# Patient Record
Sex: Male | Born: 1970 | Race: White | Hispanic: No | Marital: Single | Smoking: Former smoker
Health system: Southern US, Community
[De-identification: ages and names within clinical notes are randomized; demographics above are authoritative.]

## PROBLEM LIST (undated history)

## (undated) DIAGNOSIS — F1123 Opioid dependence with withdrawal: Secondary | ICD-10-CM

## (undated) DIAGNOSIS — F419 Anxiety disorder, unspecified: Secondary | ICD-10-CM

## (undated) DIAGNOSIS — F32A Depression, unspecified: Secondary | ICD-10-CM

## (undated) DIAGNOSIS — F1120 Opioid dependence, uncomplicated: Secondary | ICD-10-CM

## (undated) DIAGNOSIS — I10 Essential (primary) hypertension: Secondary | ICD-10-CM

## (undated) DIAGNOSIS — F1193 Opioid use, unspecified with withdrawal: Secondary | ICD-10-CM

## (undated) DIAGNOSIS — G43909 Migraine, unspecified, not intractable, without status migrainosus: Secondary | ICD-10-CM

## (undated) HISTORY — PX: CERVICAL SPINE SURGERY: SHX589

## (undated) HISTORY — PX: BACK SURGERY: SHX140

---

## 2020-04-18 ENCOUNTER — Emergency Department (HOSPITAL_COMMUNITY)
Admission: EM | Admit: 2020-04-18 | Discharge: 2020-04-18 | Disposition: A | Discharge status: Home / Self Care | Payer: Self-pay | Attending: Emergency Medicine | Admitting: Emergency Medicine

## 2020-04-18 ENCOUNTER — Emergency Department (HOSPITAL_COMMUNITY): Payer: Self-pay

## 2020-04-18 ENCOUNTER — Other Ambulatory Visit: Payer: Self-pay

## 2020-04-18 ENCOUNTER — Encounter (HOSPITAL_COMMUNITY): Payer: Self-pay | Admitting: *Deleted

## 2020-04-18 DIAGNOSIS — Z79899 Other long term (current) drug therapy: Secondary | ICD-10-CM | POA: Insufficient documentation

## 2020-04-18 DIAGNOSIS — R000 Tachycardia, unspecified: Secondary | ICD-10-CM | POA: Insufficient documentation

## 2020-04-18 DIAGNOSIS — I10 Essential (primary) hypertension: Secondary | ICD-10-CM | POA: Insufficient documentation

## 2020-04-18 DIAGNOSIS — R079 Chest pain, unspecified: Secondary | ICD-10-CM | POA: Insufficient documentation

## 2020-04-18 HISTORY — DX: Essential (primary) hypertension: I10

## 2020-04-18 HISTORY — DX: Anxiety disorder, unspecified: F41.9

## 2020-04-18 LAB — TROPONIN I (HIGH SENSITIVITY)
Troponin I (High Sensitivity): <2 ng/L (ref <18)
Troponin I (High Sensitivity): 2 ng/L (ref <18)

## 2020-04-18 LAB — BASIC METABOLIC PANEL
Anion gap: 9 (ref 5–15)
BUN: 15 mg/dL (ref 6–20)
CO2: 24 mmol/L (ref 22–32)
Calcium: 9.3 mg/dL (ref 8.9–10.3)
Chloride: 105 mmol/L (ref 98–111)
Creatinine, Ser: 0.97 mg/dL (ref 0.61–1.24)
GFR, Estimated: >60 mL/min (ref >60)
Glucose, Bld: 110 mg/dL — ABNORMAL HIGH (ref 70–99)
Potassium: 4.4 mmol/L (ref 3.5–5.1)
Sodium: 138 mmol/L (ref 135–145)

## 2020-04-18 LAB — CBC
HCT: 38.2 % — ABNORMAL LOW (ref 39.0–52.0)
Hemoglobin: 13.3 g/dL (ref 13.0–17.0)
MCH: 32.4 pg (ref 26.0–34.0)
MCHC: 34.8 g/dL (ref 30.0–36.0)
MCV: 92.9 fL (ref 80.0–100.0)
Platelets: 238 10*3/uL (ref 150–400)
RBC: 4.11 MIL/uL — ABNORMAL LOW (ref 4.22–5.81)
RDW: 11.7 % (ref 11.5–15.5)
WBC: 9 10*3/uL (ref 4.0–10.5)
nRBC: 0 % (ref 0.0–0.2)

## 2020-04-18 LAB — D-DIMER, QUANTITATIVE: D-Dimer, Quant: <0.27 ug/mL-FEU (ref 0.00–0.50)

## 2020-04-18 IMAGING — CR DG CHEST 2V
2 series · 2 of 2 positions shown · non-contrast
Comparison: None.

CLINICAL DATA: Chest pain

EXAM:
CHEST - 2 VIEW

[w chest pa]
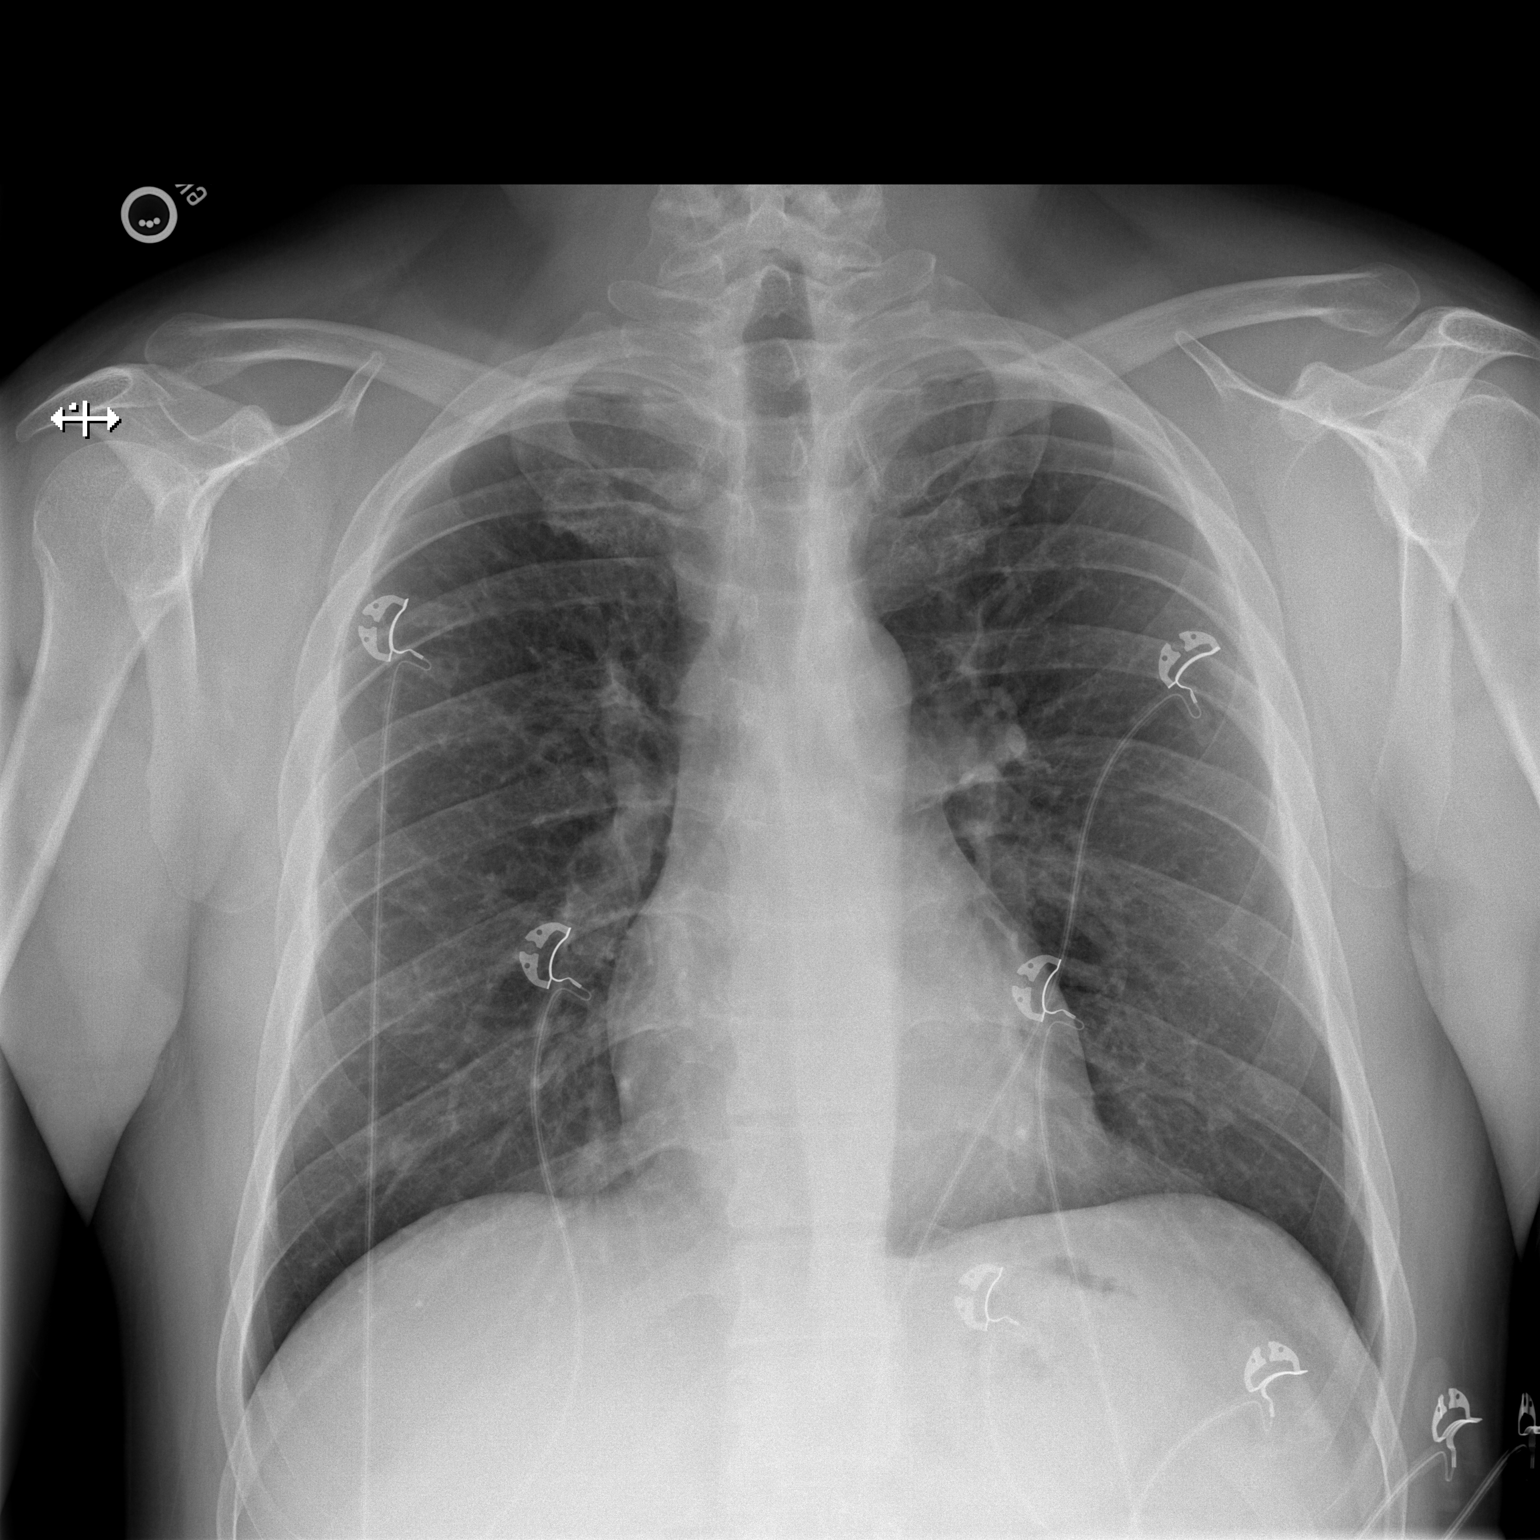

[w chest lat]
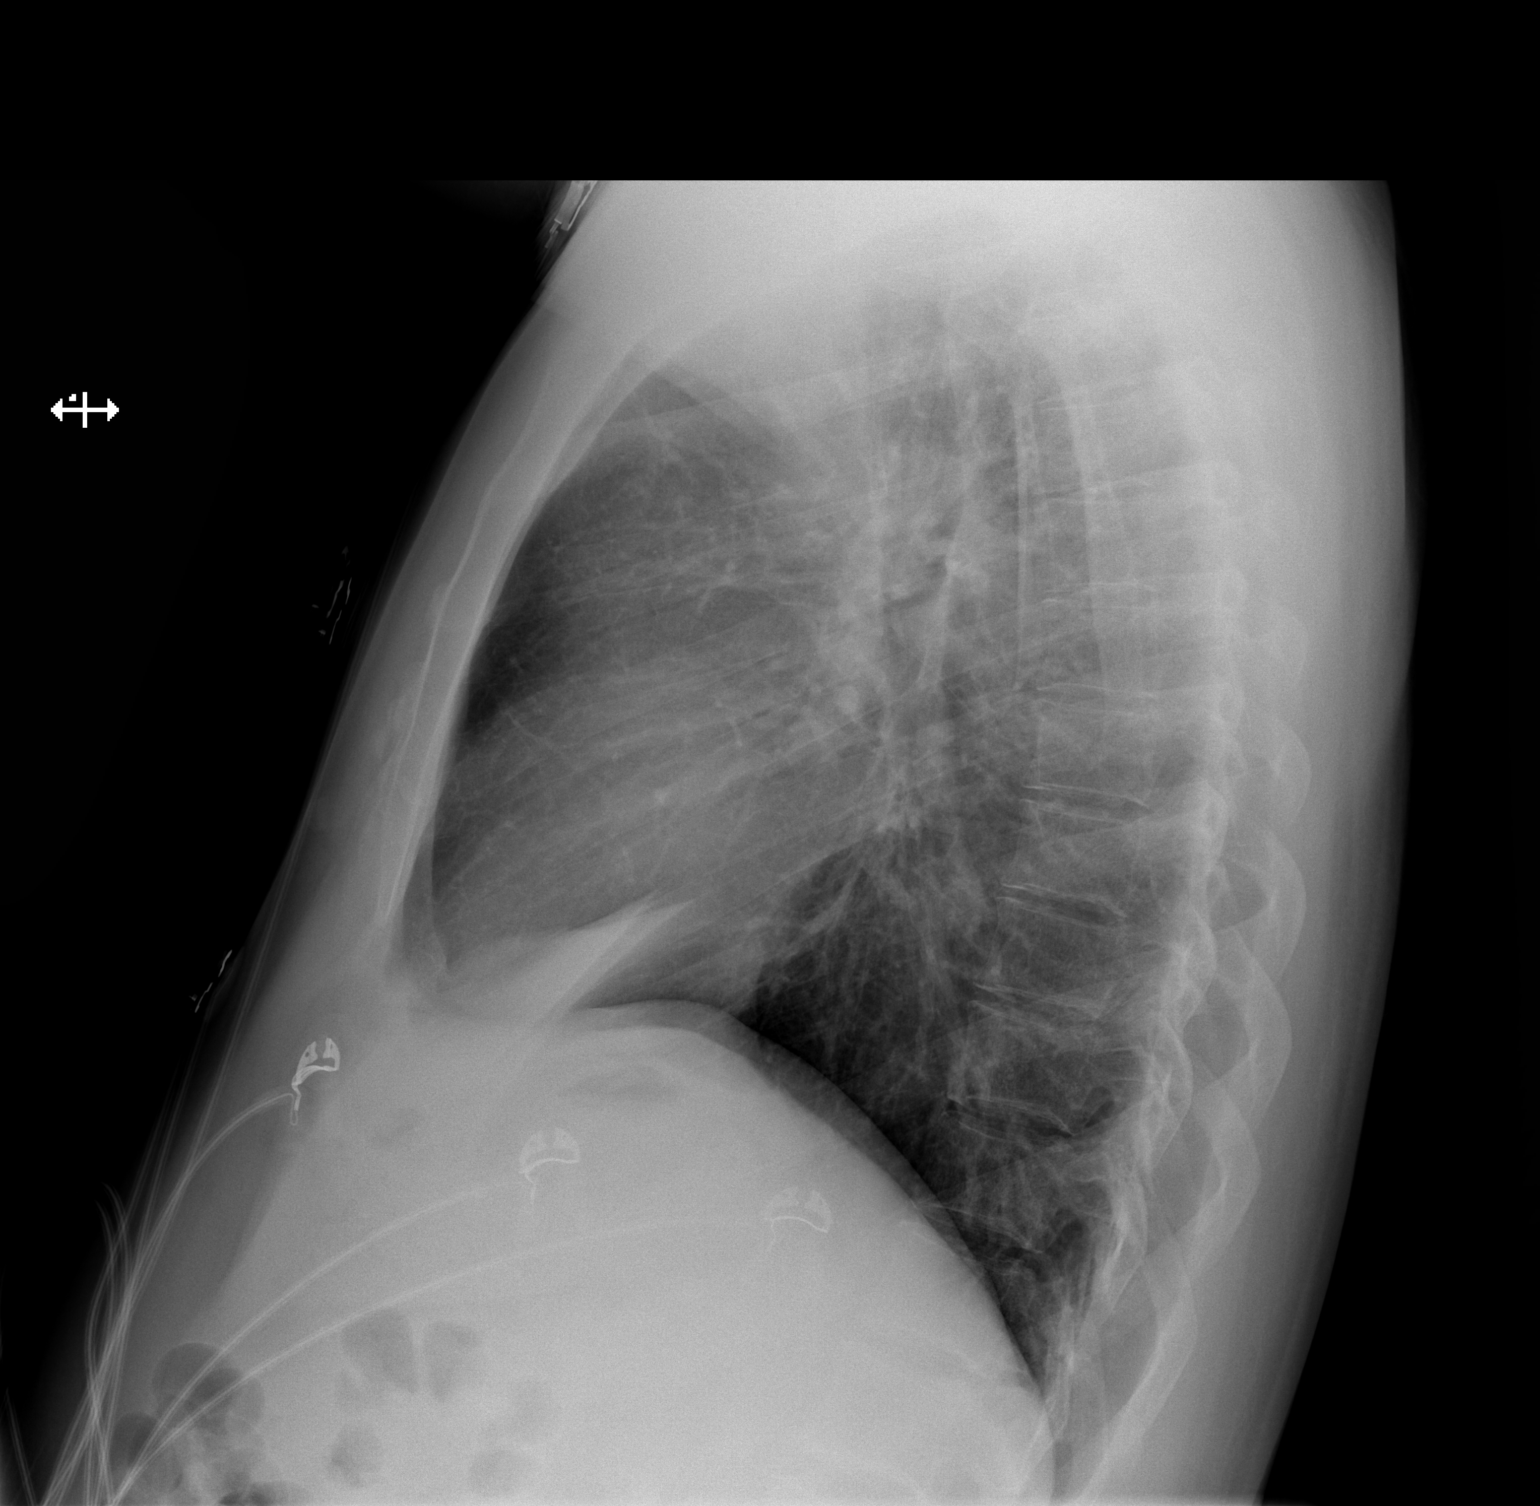

[2 of 2 positions shown; findings below may reference images not displayed]

FINDINGS: Heart and mediastinal contours are within normal limits. No focal
opacities or effusions. No acute bony abnormality.
IMPRESSION: No active cardiopulmonary disease.

## 2020-04-18 MED ORDER — FENTANYL CITRATE (PF) 100 MCG/2ML IJ SOLN
50.0000 ug | Freq: Once | INTRAMUSCULAR | Status: AC
  Administered 2020-04-18: 50 ug via INTRAVENOUS
  Filled 2020-04-18: qty 2

## 2020-04-18 MED ORDER — LORAZEPAM 2 MG/ML IJ SOLN
1.0000 mg | Freq: Once | INTRAMUSCULAR | Status: AC
  Administered 2020-04-18: 1 mg via INTRAVENOUS
  Filled 2020-04-18: qty 1

## 2020-04-18 MED ORDER — SODIUM CHLORIDE 0.9 % IV BOLUS
1000.0000 mL | Freq: Once | INTRAVENOUS | Status: AC
  Administered 2020-04-18: 1000 mL via INTRAVENOUS

## 2020-04-18 MED ORDER — ONDANSETRON HCL 4 MG/2ML IJ SOLN
4.0000 mg | Freq: Once | INTRAMUSCULAR | Status: AC
  Administered 2020-04-18: 4 mg via INTRAVENOUS
  Filled 2020-04-18: qty 2

## 2020-04-18 NOTE — ED Notes (Signed)
One unsuccessful attempt by this RN to obtain an IV.  Charge RN to attempt.

## 2020-04-18 NOTE — Discharge Instructions (Addendum)
As discussed, your work-up today was reassuring.  I have referred you to the [facility] wellness center as well as referred.  Support and given you additional resources to obtain a primary care doctor or or counseling that can help you with your anxiety.  Return to the Emergency Department immediately if you experiencing worsening chest pain, difficulty breathing, nausea/vomiting, get very sweaty, headache or any other worsening or concerning symptoms.

## 2020-04-18 NOTE — ED Triage Notes (Signed)
Pt states around 2 pm onset of chest pain central to left side. Pt had some shob and nausea. He states he has anxiety. Dad passed at 29 with MI so he decided to come have it checked out.

## 2020-04-18 NOTE — ED Notes (Signed)
Patient transported to X-ray 

## 2020-04-18 NOTE — ED Notes (Signed)
At 2009 this RN asked patient if he had a driver so pain medication can be given. Patient stated he had someone to pick him up. Fentanyl administered.  Patient being discharged now and states he going to drive home himself.

## 2020-04-18 NOTE — ED Provider Notes (Signed)
[facility]-EMERGENCY DEPT Provider Note   CSN: [id number] Arrival date & time: 04/18/20  1636     History Chief Complaint  Patient presents with  . Chest Pain    Samuel Ewing is a 49 y.o. male past medical history anxiety, hypertension who presents today for evaluation of chest pain that began about 2 PM this evening.  He reports that he was at home watching TV when chest pain started.  He described substernal chest pain that radiated to his back.  He states is been constant.  Initially was an 8 out of 10.  He said he got diaphoresis, nausea, palpitations, shortness of breath.  He states now, the pain is a 4/10.  He states that he has not taken any medications.  He feels like now he does not have any shortness of breath, nausea, dizziness, diaphoresis.  He states his dad had a history of MI at the age of 67 and his mom has history of heart attack in her 66s so he wanted to get it checked out and concerned.  He does have a history of anxiety and states he has been very anxious lately.  He recently moved to [place] from [place] to start a new job.  He states that he has had panic attacks before and states that sometimes that they have had similar symptoms.  He vapes currently.  He denies any cocaine use.  He occasionally smokes marijuana.  He is not currently on any testosterone.  He denies any recent admissions, surgeries, history of HIV, cancer, history of DVTs or PEs, leg swelling.  He does report that he recently traveled from [place] about a week ago to move down here.  He estimates was about a 6-hour drive.   The history is provided by the patient.    HPI: A 49 year old patient with a history of hypertension presents for evaluation of chest pain. Initial onset of pain was approximately 3-6 hours ago. The patient's chest pain is not worse with exertion. The patient reports some diaphoresis. The patient's chest pain is middle- or left-sided, is not  well-localized, is not described as heaviness/pressure/tightness, is not sharp and does not radiate to the arms/jaw/neck. The patient does not complain of nausea. The patient has a family history of coronary artery disease in a first-degree relative with onset less than age 16. The patient has no history of stroke, has no history of peripheral artery disease, has not smoked in the past 90 days, denies any history of treated diabetes, has no history of hypercholesterolemia and does not have an elevated BMI (>=30).   Past Medical History:  Diagnosis Date  . Anxiety   . Hypertension     There are no problems to display for this patient.   History reviewed. No pertinent surgical history.     No family history on file.  Social History   Tobacco Use  . Smoking status: Never Smoker  . Smokeless tobacco: Never Used  Vaping Use  . Vaping Use: Every day  Substance Use Topics  . Alcohol use: Never  . Drug use: Yes    Types: Marijuana    Home Medications Prior to Admission medications   Medication Sig Start Date End Date Taking? Authorizing Provider  amLODipine (NORVASC) 10 MG tablet Take 10 mg by mouth daily.  01/03/20  Yes [provider]  atorvastatin (LIPITOR) 20 MG tablet Take 20 mg by mouth daily. 04/08/20  Yes [provider]  busPIRone (BUSPAR) 30 MG  tablet Take 30 mg by mouth 2 (two) times daily. 04/08/20  Yes [provider]  DULoxetine (CYMBALTA) 20 MG capsule Take 40 mg by mouth daily.   Yes [provider]  hydrOXYzine (ATARAX/VISTARIL) 50 MG tablet Take 50 mg by mouth daily as needed for anxiety.  04/08/20  Yes [provider]  ibuprofen (ADVIL) 800 MG tablet Take 800 mg by mouth every 8 (eight) hours as needed for moderate pain.  03/24/20  Yes [provider]  levothyroxine (SYNTHROID) 50 MCG tablet Take 50 mcg by mouth daily. 04/08/20  Yes [provider]  lisinopril (ZESTRIL) 20 MG tablet Take 20 mg by mouth  daily. 04/08/20  Yes [provider]  Melatonin 10 MG TABS Take 10 mg by mouth daily as needed (sleep).   Yes [provider]  SUMAtriptan (IMITREX) 100 MG tablet Take 100 mg by mouth daily as needed for migraine. May repeat in 2 hours if headache persists or recurs.   Yes [provider]  ziprasidone (GEODON) 40 MG capsule Take 40 mg by mouth 2 (two) times daily with a meal.   Yes [provider]    Allergies    Morphine and related  Review of Systems   Review of Systems  Constitutional: Positive for diaphoresis. Negative for fever.  Respiratory: Positive for shortness of breath. Negative for cough.   Cardiovascular: Positive for chest pain.  Gastrointestinal: Positive for nausea. Negative for abdominal pain and vomiting.  Genitourinary: Negative for dysuria and hematuria.  Neurological: Negative for headaches.  All other systems reviewed and are negative.   Physical Exam Updated Vital Signs BP (!) 137/97 (BP Location: Left Arm)   Pulse 90   Temp 98.5 F (36.9 C) (Oral)   Resp 20   Ht 5\' 10"  (1.778 m)   Wt 86.2 kg   SpO2 98%   BMI 27.26 kg/m   Physical Exam Vitals and nursing note reviewed.  Constitutional:      Appearance: Normal appearance. He is well-developed.  HENT:     Head: Normocephalic and atraumatic.  Eyes:     General: Lids are normal.     Conjunctiva/sclera: Conjunctivae normal.     Pupils: Pupils are equal, round, and reactive to light.  Cardiovascular:     Rate and Rhythm: Regular rhythm. Tachycardia present.     Pulses: Normal pulses.          Radial pulses are 2+ on the right side and 2+ on the left side.       Dorsalis pedis pulses are 2+ on the right side and 2+ on the left side.     Heart sounds: Normal heart sounds. No murmur heard.  No friction rub. No gallop.   Pulmonary:     Effort: Pulmonary effort is normal.     Breath sounds: Normal breath sounds.     Comments: Lungs clear to auscultation bilaterally.   Symmetric chest rise.  No wheezing, rales, rhonchi. Abdominal:     Palpations: Abdomen is soft. Abdomen is not rigid.     Tenderness: There is no abdominal tenderness. There is no guarding.     Comments: Abdomen is soft, non-distended, non-tender. No rigidity, No guarding. No peritoneal signs.  Musculoskeletal:        General: Normal range of motion.     Cervical back: Full passive range of motion without pain.     Comments: BLE are symmetric in appearance without any overlying warmth, erythema or edema.   Skin:  General: Skin is warm and dry.     Capillary Refill: Capillary refill takes less than 2 seconds.  Neurological:     Mental Status: He is alert and oriented to person, place, and time.  Psychiatric:        Speech: Speech normal.     ED Results / Procedures / Treatments   Labs (all labs ordered are listed, but only abnormal results are displayed) Labs Reviewed  BASIC METABOLIC PANEL - Abnormal; Notable for the following components:      Result Value   Glucose, Bld 110 (*)    All other components within normal limits  CBC - Abnormal; Notable for the following components:   RBC 4.11 (*)    HCT 38.2 (*)    All other components within normal limits  D-DIMER, QUANTITATIVE (NOT AT [facility])  TROPONIN I (HIGH SENSITIVITY)  TROPONIN I (HIGH SENSITIVITY)    EKG EKG Interpretation  Date/Time:  Friday April 18 2020 16:42:42 EDT Ventricular Rate:  107 PR Interval:    QRS Duration: 122 QT Interval:  342 QTC Calculation: 457 R Axis:   46 Text Interpretation: Sinus tachycardia Nonspecific intraventricular conduction delay 12 Lead; Mason-Likar Confirmed by Bethann Berkshire [phone]) on 04/18/2020 9:07:38 PM   Radiology DG Chest 2 View  Result Date: 04/18/2020 CLINICAL DATA:  Chest pain EXAM: CHEST - 2 VIEW COMPARISON:  None. FINDINGS: Heart and mediastinal contours are within normal limits. No focal opacities or effusions. No acute bony abnormality. IMPRESSION: No active  cardiopulmonary disease. Electronically Signed   By: Charlett Nose M.D.   On: 04/18/2020 17:52    Procedures Procedures (including critical care time)  Medications Ordered in ED Medications  LORazepam (ATIVAN) injection 1 mg (1 mg Intravenous Given 04/18/20 1857)  sodium chloride 0.9 % bolus 1,000 mL (0 mLs Intravenous Stopped 04/18/20 2126)  fentaNYL (SUBLIMAZE) injection 50 mcg (50 mcg Intravenous Given 04/18/20 2009)  ondansetron (ZOFRAN) injection 4 mg (4 mg Intravenous Given 04/18/20 2009)    ED Course  I have reviewed the triage vital signs and the nursing notes.  Pertinent labs & imaging results that were available during my care of the patient were reviewed by me and considered in my medical decision making (see chart for details).  Clinical Course as of Apr 19 2307  Fri Apr 18, 2020  1828 DG Chest 2 View [KK]    Clinical Course User Index [KK] Janene Harvey Sherilyn Dacosta   MDM Rules/Calculators/A&P HEAR Score: 3                        49 year old male past history of hypertension, anxiety presents for evaluation of chest pain that began approximately 2 PM this evening.  Reports associated diaphoresis, shortness of breath, nausea/vomiting.  He states pain is improved.  He does report he has a history of anxiety and has had panic attacks.  On initial arrival, he is tachycardic, hypertensive.  He is very anxious.  He otherwise looks very well-appearing.  Suspect this is most likely anxiety.  Doubt this is ACS etiology but he does have significant cardiac history.  History/physical exam is not concerning for dissection.  Doubt PE that he is tachycardic and has recently had a travel from [place].  Will obtain blood work.  I reviewed his records.  He had previously been living in [place].  In July 2021, he was admitted for chest pain observation.  At that time, he had an echo and a stress test  that were unremarkable.  I reviewed his records and he has had multiple visits  for chest pain where he describes a similar sensation.  This seems to be typical of his chest pains.  CBC shows no leukocytosis.  Hemoglobin stable at 13.3.  Initial troponin negative.  BMP is unremarkable.  D-dimer is negative.  Chest x-ray is unremarkable.  Given patient's history/physical exam, he has a heart score of 2.   Will plan for delta troponin.  Delta trop is negative.   Discussed results with patient.  Patient's vitals are stable.  I discussed with patient regarding his medications.  He was given a 30-day course of his medications, including Zyprexa, BuSpar, Vistaril.  He states he used to be on Ativan but has not taken it in a while.  He states that he may need something more for his anxiety.  I discussed with him at length regarding starting him on Ativan here in the ED.  I do not see any record of him being prescribed Ativan on his visit on 04/08/2020 when he was given his other medications.  At this time, he has no follow-up that can monitor his Ativan use.  I discussed with him that it would be more reasonable to try an attempt to establish primary care as he will need refills of his medication with a 30-day supply runs out and that at that time, he can discuss with them regarding Ativan.  Patient is agreeable. At this time, patient exhibits no emergent life-threatening condition that require further evaluation in ED. Discussed patient with Dr. Estell Harpin who agrees with plan. Patient had ample opportunity for questions and discussion. All patient's questions were answered with full understanding. Strict return precautions discussed. Patient expresses understanding and agreement to plan.   Portions of this note were generated with Scientist, clinical (histocompatibility and immunogenetics). Dictation errors may occur despite best attempts at proofreading.  Final Clinical Impression(s) / ED Diagnoses Final diagnoses:  Nonspecific chest pain    Rx / DC Orders ED Discharge Orders    None       Rosana Hoes 04/18/20 2308    Bethann Berkshire, MD 04/29/20 1539

## 2020-04-30 ENCOUNTER — Other Ambulatory Visit: Payer: Self-pay

## 2020-04-30 ENCOUNTER — Emergency Department (HOSPITAL_COMMUNITY): Payer: Self-pay

## 2020-04-30 ENCOUNTER — Emergency Department (HOSPITAL_COMMUNITY)
Admission: EM | Admit: 2020-04-30 | Discharge: 2020-04-30 | Disposition: A | Discharge status: Home / Self Care | Payer: Self-pay | Attending: Emergency Medicine | Admitting: Emergency Medicine

## 2020-04-30 ENCOUNTER — Encounter (HOSPITAL_COMMUNITY): Payer: Self-pay

## 2020-04-30 DIAGNOSIS — I10 Essential (primary) hypertension: Secondary | ICD-10-CM | POA: Insufficient documentation

## 2020-04-30 DIAGNOSIS — Z20822 Contact with and (suspected) exposure to covid-19: Secondary | ICD-10-CM | POA: Insufficient documentation

## 2020-04-30 DIAGNOSIS — Z79899 Other long term (current) drug therapy: Secondary | ICD-10-CM | POA: Insufficient documentation

## 2020-04-30 DIAGNOSIS — G43809 Other migraine, not intractable, without status migrainosus: Secondary | ICD-10-CM | POA: Insufficient documentation

## 2020-04-30 DIAGNOSIS — F410 Panic disorder [episodic paroxysmal anxiety] without agoraphobia: Secondary | ICD-10-CM | POA: Insufficient documentation

## 2020-04-30 HISTORY — DX: Migraine, unspecified, not intractable, without status migrainosus: G43.909

## 2020-04-30 LAB — TROPONIN I (HIGH SENSITIVITY): Troponin I (High Sensitivity): <2 ng/L (ref <18)

## 2020-04-30 LAB — CBC
HCT: 38.7 % — ABNORMAL LOW (ref 39.0–52.0)
Hemoglobin: 13.5 g/dL (ref 13.0–17.0)
MCH: 32.4 pg (ref 26.0–34.0)
MCHC: 34.9 g/dL (ref 30.0–36.0)
MCV: 92.8 fL (ref 80.0–100.0)
Platelets: 208 10*3/uL (ref 150–400)
RBC: 4.17 MIL/uL — ABNORMAL LOW (ref 4.22–5.81)
RDW: 11.6 % (ref 11.5–15.5)
WBC: 6.2 10*3/uL (ref 4.0–10.5)
nRBC: 0 % (ref 0.0–0.2)

## 2020-04-30 LAB — RESPIRATORY PANEL BY RT PCR (FLU A&B, COVID)
Influenza A by PCR: NEGATIVE
Influenza B by PCR: NEGATIVE
SARS Coronavirus 2 by RT PCR: NEGATIVE

## 2020-04-30 LAB — BASIC METABOLIC PANEL
Anion gap: 11 (ref 5–15)
BUN: 12 mg/dL (ref 6–20)
CO2: 25 mmol/L (ref 22–32)
Calcium: 9.3 mg/dL (ref 8.9–10.3)
Chloride: 103 mmol/L (ref 98–111)
Creatinine, Ser: 0.97 mg/dL (ref 0.61–1.24)
GFR, Estimated: >60 mL/min (ref >60)
Glucose, Bld: 98 mg/dL (ref 70–99)
Potassium: 4.2 mmol/L (ref 3.5–5.1)
Sodium: 139 mmol/L (ref 135–145)

## 2020-04-30 IMAGING — CR DG CHEST 2V
2 series · 2 of 2 positions shown · non-contrast
Comparison: [DATE]

CLINICAL DATA: Mid chest tightness for 2 hours

EXAM:
CHEST - 2 VIEW

[w chest pa]
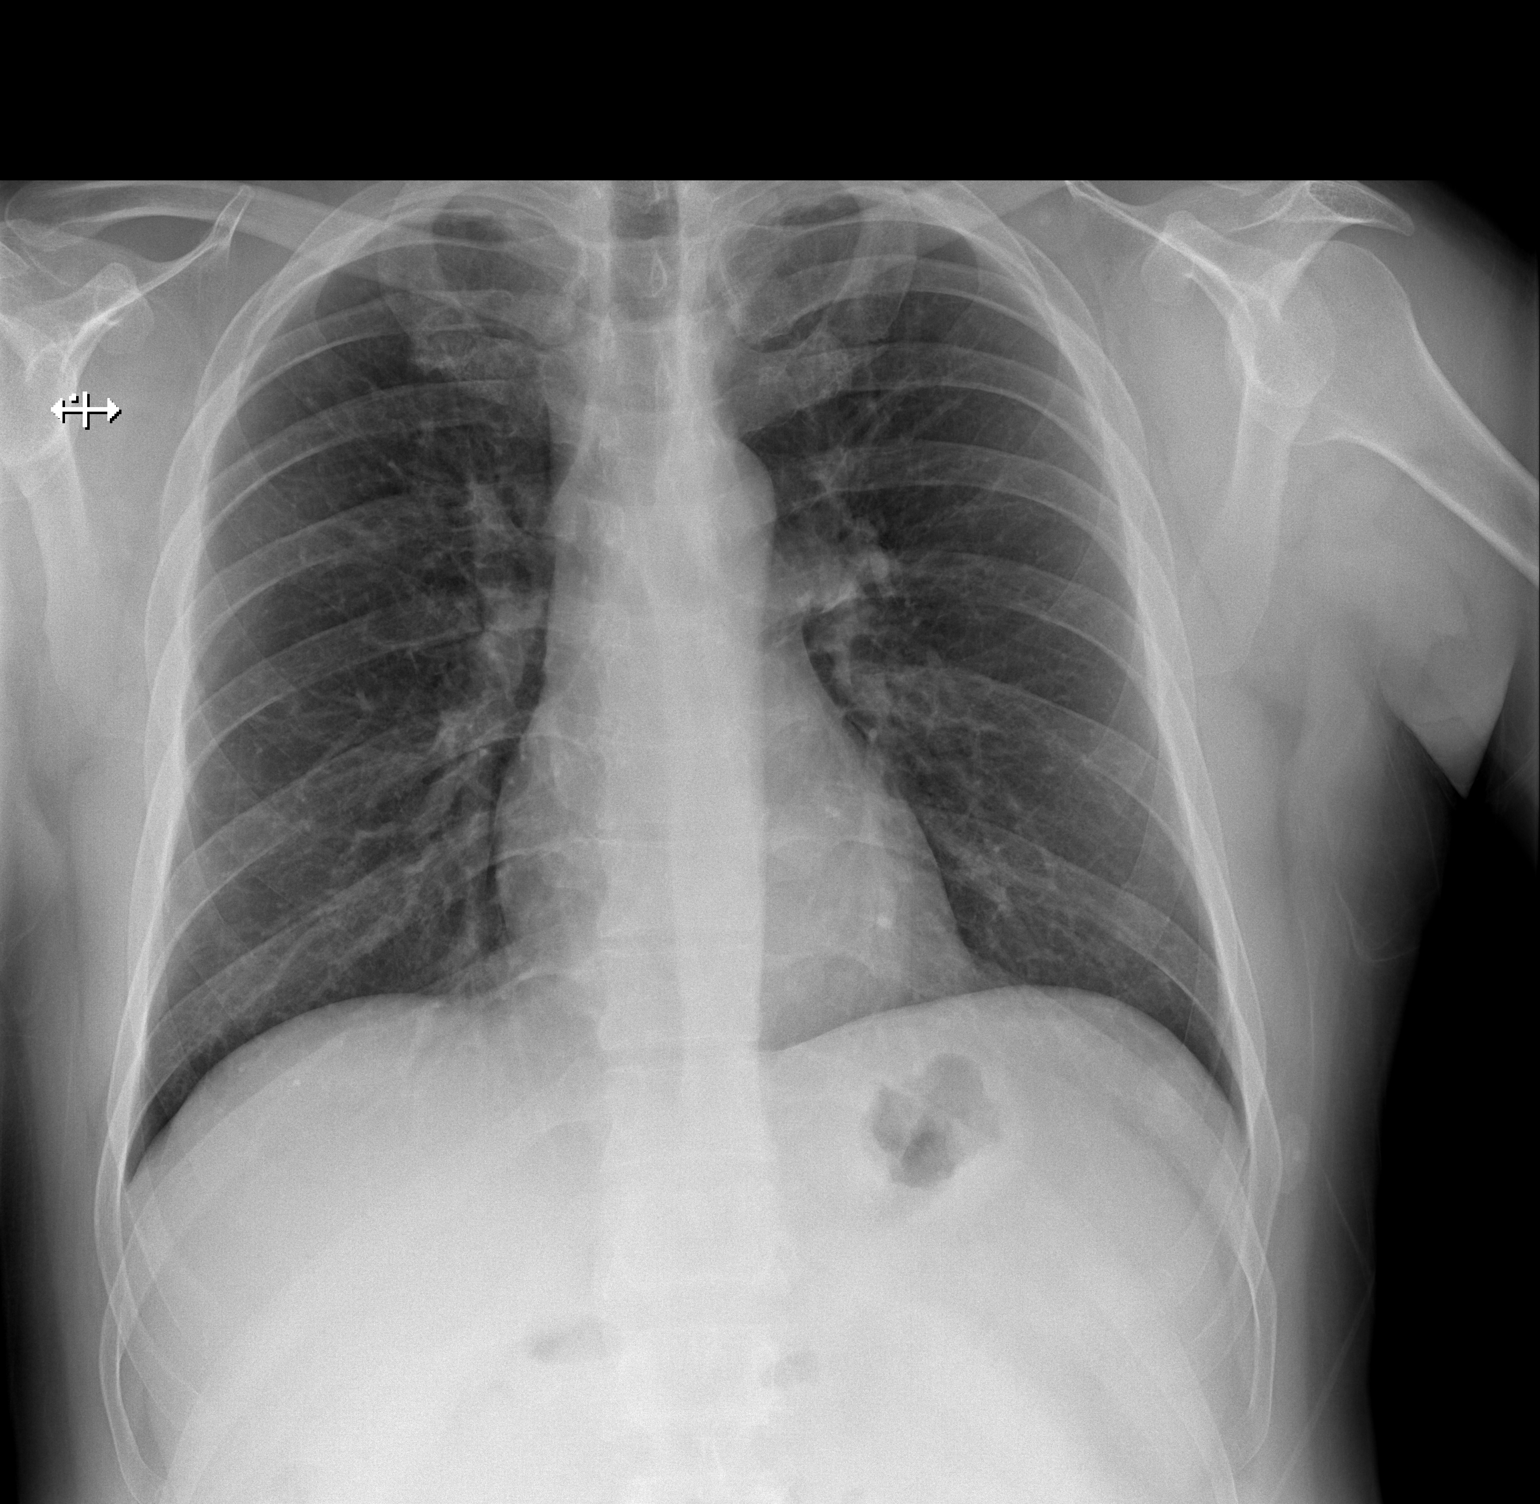

[w chest lat]
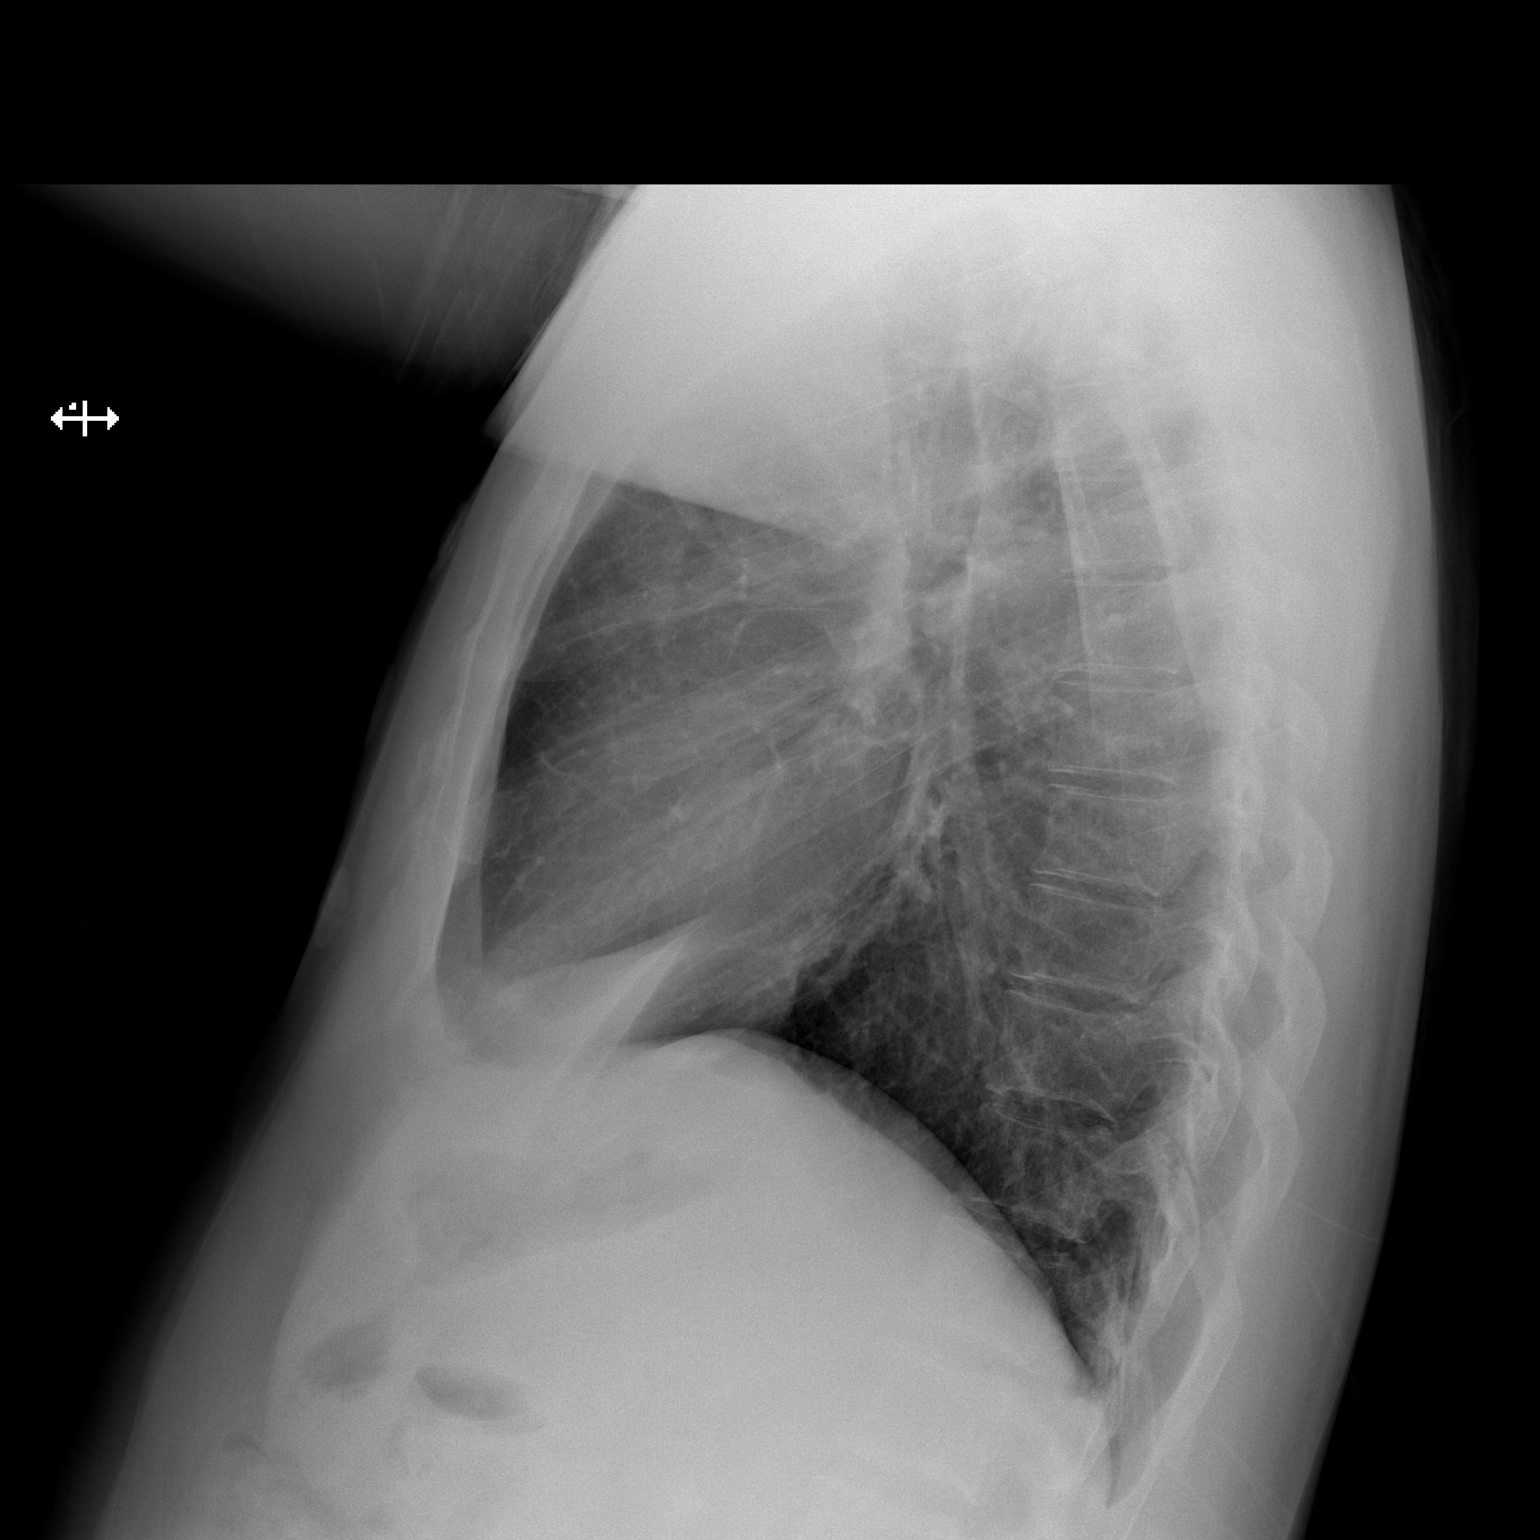

[2 of 2 positions shown; findings below may reference images not displayed]

FINDINGS: The heart size and mediastinal contours are within normal limits.
Both lungs are clear. The visualized skeletal structures are
unremarkable.
IMPRESSION: No active cardiopulmonary disease.

## 2020-04-30 MED ORDER — ONDANSETRON 4 MG PO TBDP
4.0000 mg | ORAL_TABLET | Freq: Once | ORAL | Status: AC
  Administered 2020-04-30: 4 mg via ORAL
  Filled 2020-04-30: qty 1

## 2020-04-30 MED ORDER — FENTANYL CITRATE (PF) 100 MCG/2ML IJ SOLN
50.0000 ug | Freq: Once | INTRAMUSCULAR | Status: AC
  Administered 2020-04-30: 50 ug via INTRAVENOUS
  Filled 2020-04-30: qty 2

## 2020-04-30 MED ORDER — METOCLOPRAMIDE HCL 5 MG/ML IJ SOLN
10.0000 mg | Freq: Once | INTRAMUSCULAR | Status: AC
  Administered 2020-04-30: 10 mg via INTRAVENOUS
  Filled 2020-04-30: qty 2

## 2020-04-30 MED ORDER — LORAZEPAM 1 MG PO TABS
1.0000 mg | ORAL_TABLET | Freq: Once | ORAL | Status: AC
  Administered 2020-04-30: 1 mg via ORAL
  Filled 2020-04-30: qty 1

## 2020-04-30 MED ORDER — DIPHENHYDRAMINE HCL 50 MG/ML IJ SOLN
25.0000 mg | Freq: Once | INTRAMUSCULAR | Status: AC
  Administered 2020-04-30: 25 mg via INTRAVENOUS
  Filled 2020-04-30: qty 1

## 2020-04-30 MED ORDER — PROCHLORPERAZINE EDISYLATE 10 MG/2ML IJ SOLN
10.0000 mg | Freq: Once | INTRAMUSCULAR | Status: AC
  Administered 2020-04-30: 10 mg via INTRAVENOUS
  Filled 2020-04-30: qty 2

## 2020-04-30 NOTE — ED Triage Notes (Signed)
Patient c/o mid chest tightness x 2 hours. Patient states he took hydroxyzine with no relief.  patient also c/o headache/migraine which he says he has a history of. Migraine began this AM. Patient c/o nausea and diarrhea.

## 2020-04-30 NOTE — ED Provider Notes (Signed)
[facility]-EMERGENCY DEPT Provider Note   CSN: [id number] Arrival date & time: 04/30/20  1602     History Chief Complaint  Patient presents with  . Chest Pain  . Headache  . Shortness of Breath  . Anxiety    Samuel Ewing is a 49 y.o. male.  HPI 49 year old male with history of hypertension, anxiety and migraines presents to the ER with complaints of migraine, chest tightness in the setting of a panic attack.  Patient states that he just moved here 2 weeks ago and has been going through some stress adjusting to new job.  He has some home sumatriptan that he took with little relief.  States he has aura currently.  Denies any nausea or vomiting.  States this is very typical for his migraines.  He states he has had prior history of hospitalizations for intractable migraine.  He denies any shortness of breath, diaphoresis, pain radiating to the back.  Denies any fevers or chills.  States that his chest tightness is typical for his anxiety.    Past Medical History:  Diagnosis Date  . Anxiety   . Hypertension   . Migraine     There are no problems to display for this patient.   History reviewed. No pertinent surgical history.     Family History  Problem Relation Age of Onset  . Heart failure Mother   . Heart failure Father   . Heart attack Father     Social History   Tobacco Use  . Smoking status: Never Smoker  . Smokeless tobacco: Never Used  Vaping Use  . Vaping Use: Every day  . Substances: Nicotine  Substance Use Topics  . Alcohol use: Never  . Drug use: Yes    Types: Marijuana    Home Medications Prior to Admission medications   Medication Sig Start Date End Date Taking? Authorizing Provider  amLODipine (NORVASC) 10 MG tablet Take 10 mg by mouth daily.  01/03/20  Yes [provider]  atorvastatin (LIPITOR) 20 MG tablet Take 20 mg by mouth daily. 04/08/20  Yes [provider]  busPIRone (BUSPAR) 30 MG tablet Take  30 mg by mouth 2 (two) times daily. 04/08/20  Yes [provider]  DULoxetine (CYMBALTA) 20 MG capsule Take 40 mg by mouth daily.   Yes [provider]  hydrOXYzine (ATARAX/VISTARIL) 50 MG tablet Take 50 mg by mouth daily as needed for anxiety.  04/08/20  Yes [provider]  ibuprofen (ADVIL) 800 MG tablet Take 800 mg by mouth every 8 (eight) hours as needed for moderate pain.  03/24/20  Yes [provider]  levothyroxine (SYNTHROID) 50 MCG tablet Take 50 mcg by mouth daily. 04/08/20  Yes [provider]  lisinopril (ZESTRIL) 20 MG tablet Take 20 mg by mouth daily. 04/08/20  Yes [provider]  LORazepam (ATIVAN) 0.5 MG tablet Take 0.5 mg by mouth daily as needed for anxiety.  04/14/20  Yes [provider]  Melatonin 10 MG TABS Take 10 mg by mouth daily as needed (sleep).   Yes [provider]  SUMAtriptan (IMITREX) 100 MG tablet Take 100 mg by mouth daily as needed for migraine. May repeat in 2 hours if headache persists or recurs.   Yes [provider]  ziprasidone (GEODON) 40 MG capsule Take 40 mg by mouth 2 (two) times daily with a meal.   Yes [provider]    Allergies    Morphine and related  Review of  Systems   Review of Systems  Constitutional: Negative for chills and fever.  HENT: Negative for ear pain and sore throat.   Eyes: Positive for visual disturbance. Negative for pain.  Respiratory: Negative for cough and shortness of breath.   Cardiovascular: Positive for chest pain. Negative for palpitations.  Gastrointestinal: Negative for abdominal pain and vomiting.  Genitourinary: Negative for dysuria and hematuria.  Musculoskeletal: Negative for arthralgias and back pain.  Skin: Negative for color change and rash.  Neurological: Positive for headaches. Negative for seizures and syncope.  All other systems reviewed and are negative.   Physical Exam Updated Vital Signs BP (!) 147/90 (BP  Location: Left Arm)   Pulse 98   Temp 98.5 F (36.9 C) (Oral)   Resp 18   Ht 5\' 10"  (1.778 m)   Wt 86.2 kg   SpO2 98%   BMI 27.26 kg/m   Physical Exam Vitals and nursing note reviewed.  Constitutional:      General: He is not in acute distress.    Appearance: He is well-developed. He is not ill-appearing, toxic-appearing or diaphoretic.     Comments: Resting comfortably in the ER bed with the lights off  HENT:     Head: Normocephalic and atraumatic.  Eyes:     Conjunctiva/sclera: Conjunctivae normal.  Cardiovascular:     Rate and Rhythm: Normal rate and regular rhythm.     Pulses:          Radial pulses are 2+ on the right side and 2+ on the left side.     Heart sounds: Normal heart sounds. No murmur heard.   Pulmonary:     Effort: Pulmonary effort is normal. No respiratory distress.     Breath sounds: Normal breath sounds. No decreased breath sounds or wheezing.  Abdominal:     Palpations: Abdomen is soft.     Tenderness: There is no abdominal tenderness.  Musculoskeletal:     Cervical back: Neck supple.     Right lower leg: No tenderness. No edema.     Left lower leg: No tenderness. No edema.  Skin:    General: Skin is warm and dry.  Neurological:     General: No focal deficit present.     Mental Status: He is alert.     Cranial Nerves: No cranial nerve deficit.     Motor: No weakness.     Comments: Mental Status:  Alert, thought content appropriate, able to give a coherent history. Speech fluent without evidence of aphasia. Able to follow 2 step commands without difficulty.  Cranial Nerves:  II: Peripheral visual fields grossly normal, pupils equal, round, reactive to light III,IV, VI: ptosis not present, extra-ocular motions intact bilaterally  V,VII: smile symmetric, facial light touch sensation equal VIII: hearing grossly normal to voice  X: uvula elevates symmetrically  XI: bilateral shoulder shrug symmetric and strong XII: midline tongue extension without  fassiculations Motor:  Normal tone. 5/5 strength of BUE and BLE major muscle groups including strong and equal grip strength and dorsiflexion/plantar flexion Sensory: light touch normal in all extremities. Cerebellar: normal finger-to-nose with bilateral upper extremities, Romberg sign absent Gait: normal gait and balance. Able to walk on toes and heels with ease.       ED Results / Procedures / Treatments   Labs (all labs ordered are listed, but only abnormal results are displayed) Labs Reviewed  CBC - Abnormal; Notable for the following components:      Result Value   RBC 4.17 (*)  HCT 38.7 (*)    All other components within normal limits  RESPIRATORY PANEL BY RT PCR (FLU A&B, COVID)  BASIC METABOLIC PANEL  TROPONIN I (HIGH SENSITIVITY)  TROPONIN I (HIGH SENSITIVITY)    EKG None  Radiology DG Chest 2 View  Result Date: 04/30/2020 CLINICAL DATA:  Mid chest tightness for 2 hours EXAM: CHEST - 2 VIEW COMPARISON:  04/18/2020 FINDINGS: The heart size and mediastinal contours are within normal limits. Both lungs are clear. The visualized skeletal structures are unremarkable. IMPRESSION: No active cardiopulmonary disease. Electronically Signed   By: Sharlet Salina M.D.   On: 04/30/2020 16:28    Procedures Procedures (including critical care time)  Medications Ordered in ED Medications  prochlorperazine (COMPAZINE) injection 10 mg (10 mg Intravenous Given 04/30/20 1842)  diphenhydrAMINE (BENADRYL) injection 25 mg (25 mg Intravenous Given 04/30/20 1842)  ondansetron (ZOFRAN-ODT) disintegrating tablet 4 mg (4 mg Oral Given 04/30/20 1730)  LORazepam (ATIVAN) tablet 1 mg (1 mg Oral Given 04/30/20 1843)  metoCLOPramide (REGLAN) injection 10 mg (10 mg Intravenous Given 04/30/20 2000)  fentaNYL (SUBLIMAZE) injection 50 mcg (50 mcg Intravenous Given 04/30/20 2003)    ED Course  I have reviewed the triage vital signs and the nursing notes.  Pertinent labs & imaging results that were  available during my care of the patient were reviewed by me and considered in my medical decision making (see chart for details).    MDM Rules/Calculators/A&P                          Patient with complaints of migraine and chest tightness in the setting of a panic attack Presentation, he is alert, oriented, nontoxic-appearing, no acute distress, resting comfortably in the ER bed with the lights off.  He is slightly hypertensive but overall vitals are reassuring.  Physical exam with no gross neurologic deficits, lung sounds clear, he appears to be in no respiratory distress.  His work appears overall reassuring, BMP without any abnormalities, troponin less than 2, Covid is negative, CBC without abnormalities.  Patient states that this chest tightness is consistent with his panic attacks.  Chest x-ray without evidence of abnormality.  EKG reviewed by me, normal sinus rhythm.  Low suspicion for ACS, intracranial bleed, dissection, PE.  Patient was treated with migraine cocktail, Zofran, stated he was still having a migraine.  As per discussion with Dr. Freida Busman, I did give the patient 1 mg of Ativan, Reglan and fentanyl.  On reevaluation, patient is significant improvement of symptoms and would like to go home.  Contact information for neurology, migraine clinic, psychiatry, PCP was given.  Return precautions discussed.  He voiced understanding and is agreeable.  At this stage in the ED course, the the patient is medically screened and stable for discharge. Final Clinical Impression(s) / ED Diagnoses Final diagnoses:  Panic attack  Other migraine without status migrainosus, not intractable    Rx / DC Orders ED Discharge Orders    None       Leone Brand 04/30/20 2032    Lorre Nick, MD 04/30/20 [phone]

## 2020-04-30 NOTE — ED Notes (Signed)
CBC recollected and sent to lab 

## 2020-04-30 NOTE — Discharge Instructions (Addendum)
Your work-up today was overall reassuring.  I have provided the contact information for [facility] neurology, or you may call the headache wellness center as well to try to make an appointment.  You may call the phone number in your discharge paperwork in order to establish with a primary care doctor.  Also, I have provided the resource guide with several psychiatric contact information.  If you are experiencing an acute panic attack, you may also go to the [facility] behavioral health urgent care

## 2020-05-06 ENCOUNTER — Other Ambulatory Visit: Payer: Self-pay

## 2020-05-06 ENCOUNTER — Emergency Department (HOSPITAL_COMMUNITY)
Admission: EM | Admit: 2020-05-06 | Discharge: 2020-05-06 | Disposition: A | Discharge status: Home / Self Care | Payer: Self-pay | Attending: Emergency Medicine | Admitting: Emergency Medicine

## 2020-05-06 ENCOUNTER — Ambulatory Visit (HOSPITAL_COMMUNITY)
Admission: EM | Admit: 2020-05-06 | Discharge: 2020-05-06 | Disposition: A | Discharge status: Home / Self Care | Payer: Self-pay

## 2020-05-06 ENCOUNTER — Encounter (HOSPITAL_COMMUNITY): Payer: Self-pay | Admitting: *Deleted

## 2020-05-06 DIAGNOSIS — R519 Headache, unspecified: Secondary | ICD-10-CM | POA: Insufficient documentation

## 2020-05-06 DIAGNOSIS — Z79899 Other long term (current) drug therapy: Secondary | ICD-10-CM | POA: Insufficient documentation

## 2020-05-06 DIAGNOSIS — R112 Nausea with vomiting, unspecified: Secondary | ICD-10-CM | POA: Insufficient documentation

## 2020-05-06 DIAGNOSIS — I10 Essential (primary) hypertension: Secondary | ICD-10-CM | POA: Insufficient documentation

## 2020-05-06 DIAGNOSIS — G43809 Other migraine, not intractable, without status migrainosus: Secondary | ICD-10-CM

## 2020-05-06 DIAGNOSIS — F419 Anxiety disorder, unspecified: Secondary | ICD-10-CM | POA: Insufficient documentation

## 2020-05-06 MED ORDER — FENTANYL CITRATE (PF) 100 MCG/2ML IJ SOLN
50.0000 ug | Freq: Once | INTRAMUSCULAR | Status: AC
  Administered 2020-05-06: 50 ug via NASAL
  Filled 2020-05-06: qty 2

## 2020-05-06 NOTE — ED Provider Notes (Signed)
[facility]-EMERGENCY DEPT Provider Note   CSN: [id number] Arrival date & time: 05/06/20  1050     History Chief Complaint  Patient presents with  . Migraine  . Anxiety    Samuel Ewing is a 49 y.o. male.  HPI     49 year old male history of anxiety and migraines presents today complaining of migraine headache.  He states he is also having symptoms consistent with his anxiety.  Migraine headache worsened this morning approximately 7 AM.  He is taken 2 doses of his Imitrex without relief.  He describes this as his usual migraine headache but severe in nature.  He has had some associated nausea and vomiting. Past Medical History:  Diagnosis Date  . Anxiety   . Hypertension   . Migraine     There are no problems to display for this patient.   History reviewed. No pertinent surgical history.     Family History  Problem Relation Age of Onset  . Heart failure Mother   . Heart failure Father   . Heart attack Father     Social History   Tobacco Use  . Smoking status: Never Smoker  . Smokeless tobacco: Never Used  Vaping Use  . Vaping Use: Every day  . Substances: Nicotine  Substance Use Topics  . Alcohol use: Never  . Drug use: Yes    Types: Marijuana    Home Medications Prior to Admission medications   Medication Sig Start Date End Date Taking? Authorizing Provider  amLODipine (NORVASC) 10 MG tablet Take 10 mg by mouth daily.  01/03/20   [provider]  atorvastatin (LIPITOR) 20 MG tablet Take 20 mg by mouth daily. 04/08/20   [provider]  busPIRone (BUSPAR) 30 MG tablet Take 30 mg by mouth 2 (two) times daily. 04/08/20   [provider]  DULoxetine (CYMBALTA) 20 MG capsule Take 40 mg by mouth daily.    [provider]  hydrOXYzine (ATARAX/VISTARIL) 50 MG tablet Take 50 mg by mouth daily as needed for anxiety.  04/08/20   [provider]  ibuprofen (ADVIL) 800 MG tablet Take 800 mg by  mouth every 8 (eight) hours as needed for moderate pain.  03/24/20   [provider]  levothyroxine (SYNTHROID) 50 MCG tablet Take 50 mcg by mouth daily. 04/08/20   [provider]  lisinopril (ZESTRIL) 20 MG tablet Take 20 mg by mouth daily. 04/08/20   [provider]  LORazepam (ATIVAN) 0.5 MG tablet Take 0.5 mg by mouth daily as needed for anxiety.  04/14/20   [provider]  Melatonin 10 MG TABS Take 10 mg by mouth daily as needed (sleep).    [provider]  SUMAtriptan (IMITREX) 100 MG tablet Take 100 mg by mouth daily as needed for migraine. May repeat in 2 hours if headache persists or recurs.    [provider]  ziprasidone (GEODON) 40 MG capsule Take 40 mg by mouth 2 (two) times daily with a meal.    [provider]    Allergies    Morphine and related  Review of Systems   Review of Systems  All other systems reviewed and are negative.   Physical Exam Updated Vital Signs BP (!) 142/92 (BP Location: Right Arm)   Pulse 94   Temp 98.2 F (36.8 C) (Oral)   Resp 16   Ht 1.778 m (5\' 10" )   Wt 86.2 kg   SpO2 98%   BMI 27.26 kg/m  Physical Exam Vitals reviewed.  Constitutional:      Appearance: Normal appearance.     Comments: Sunglasses in place  HENT:     Head: Normocephalic.     Nose: Nose normal.     Mouth/Throat:     Mouth: Mucous membranes are moist.  Eyes:     Extraocular Movements: Extraocular movements intact.     Pupils: Pupils are equal, round, and reactive to light.  Cardiovascular:     Rate and Rhythm: Normal rate.     Pulses: Normal pulses.  Pulmonary:     Effort: Pulmonary effort is normal.  Abdominal:     Palpations: Abdomen is soft.  Musculoskeletal:        General: Normal range of motion.     Cervical back: Normal range of motion.  Skin:    General: Skin is warm.     Capillary Refill: Capillary refill takes less than 2 seconds.  Neurological:     General: No focal deficit  present.     Mental Status: He is alert and oriented to person, place, and time.     Motor: No weakness.     Coordination: Coordination normal.  Psychiatric:        Mood and Affect: Mood normal.     ED Results / Procedures / Treatments   Labs (all labs ordered are listed, but only abnormal results are displayed) Labs Reviewed - No data to display  EKG None  Radiology No results found.  Procedures Procedures (including critical care time)  Medications Ordered in ED Medications  fentaNYL (SUBLIMAZE) injection 50 mcg (has no administration in time range)    ED Course  I have reviewed the triage vital signs and the nursing notes.  Pertinent labs & imaging results that were available during my care of the patient were reviewed by me and considered in my medical decision making (see chart for details).    MDM Rules/Calculators/A&P                          Patient with history of migraines.  No relief with home meds. STates mutiple allergies- able to take fentanyl and dilaudid which  Usually work for headaches Advised re need to follow up with a primary doctor. States he is driving but can have a ride come get him. Final Clinical Impression(s) / ED Diagnoses Final diagnoses:  None    Rx / DC Orders ED Discharge Orders    None       Margarita Grizzle, MD 05/07/20 1320

## 2020-05-06 NOTE — ED Notes (Signed)
An After Visit Summary was printed and given to the patient. Discharge instructions given and no further questions at this time.  Pt states a friend is driving him home.

## 2020-05-06 NOTE — ED Triage Notes (Signed)
Pt states "I'm having a terrible migraine and panic attack" Reports nausea and photophobia.

## 2020-06-06 ENCOUNTER — Ambulatory Visit: Payer: Self-pay | Attending: Internal Medicine | Admitting: Internal Medicine

## 2020-06-06 ENCOUNTER — Other Ambulatory Visit: Payer: Self-pay

## 2020-06-06 DIAGNOSIS — E039 Hypothyroidism, unspecified: Secondary | ICD-10-CM

## 2020-06-06 DIAGNOSIS — G43109 Migraine with aura, not intractable, without status migrainosus: Secondary | ICD-10-CM

## 2020-06-06 DIAGNOSIS — F331 Major depressive disorder, recurrent, moderate: Secondary | ICD-10-CM | POA: Insufficient documentation

## 2020-06-06 DIAGNOSIS — R7989 Other specified abnormal findings of blood chemistry: Secondary | ICD-10-CM | POA: Insufficient documentation

## 2020-06-06 DIAGNOSIS — Z7689 Persons encountering health services in other specified circumstances: Secondary | ICD-10-CM

## 2020-06-06 DIAGNOSIS — F411 Generalized anxiety disorder: Secondary | ICD-10-CM | POA: Insufficient documentation

## 2020-06-06 DIAGNOSIS — I10 Essential (primary) hypertension: Secondary | ICD-10-CM

## 2020-06-06 DIAGNOSIS — E785 Hyperlipidemia, unspecified: Secondary | ICD-10-CM | POA: Insufficient documentation

## 2020-06-06 DIAGNOSIS — Z23 Encounter for immunization: Secondary | ICD-10-CM

## 2020-06-06 MED ORDER — ATORVASTATIN CALCIUM 20 MG PO TABS
20.0000 mg | ORAL_TABLET | Freq: Every day | ORAL | 3 refills | Status: DC
Start: 2020-06-06 — End: 2020-10-06

## 2020-06-06 MED ORDER — LEVOTHYROXINE SODIUM 50 MCG PO TABS: 50.0000 ug | ORAL_TABLET | Freq: Every day | ORAL | 3 refills | Status: DC

## 2020-06-06 MED ORDER — LISINOPRIL 20 MG PO TABS: 20.0000 mg | ORAL_TABLET | Freq: Every day | ORAL | 3 refills | Status: DC

## 2020-06-06 MED ORDER — AMLODIPINE BESYLATE 10 MG PO TABS: 10.0000 mg | ORAL_TABLET | Freq: Every day | ORAL | 3 refills | Status: DC

## 2020-06-06 NOTE — Progress Notes (Signed)
Virtual Visit via Telephone Note  I connected with Samuel Ewing on 06/06/20 at 10:20 a.m EST by telephone and verified that I am speaking with the correct person using two identifiers.  Location: Patient: home Provider: office   I discussed the limitations, risks, security and privacy concerns of performing an evaluation and management service by telephone and the availability of in person appointments. I also discussed with the patient that there may be a patient responsible charge related to this service. The patient expressed understanding and agreed to proceed.   History of Present Illness: Patient with history of hypertension, HL, migraines, anxiety disorder, hypothyroidism, family history of heart disease in both parents.  Pt to est care  Recently relocated from [place]. PCP their was at [facility].  Relocated to [place] 04/11/2020 and saw PCP before moving.  Relocated to [facility] for work.  He is the Theatre stage manager at a [employer]  He was seeing psychiatrist and urology.   Self pay.  Will have insurance [facility] 06/29/19.  Anxiety/Depression: He was seeing a psychiatrist for his mental health issues when he lived in [place].  Has appt with [facility] on Monday to establish care and get plugged in with mental health.  Current psych medications include BuSpar, Geodon, Cymbalta, hydroxyzine and lorazepam.  He states that he has enough supply of his psychiatric medications.  Does not feel his symptoms of depression/anxiety are controlled  No SI Contributors: moving, starting a new job, living out of hotel currently is all contributing factors to him feeling that his mental health is not as controlled as it should be..  .   HTN: constant with taking meds and salt restriction.  He is on lisinopril and amlodipine. No CP/SOB/LE edema Hx of migraines. No dizziness  Migraines:  Day 4 yrs agoOn Imitrex as needed. Reports getting 2-3 episodes a wk Nausea, photophobia,  aura which he describes as fogginess in both visual field.  HA can last 3-4 hrs.  Imitrex worse sometimes and sometimes not; usually takes about 1 hr to kick. Use to get Botox inj 1.5 yrs ago and found it very helpful. Was on Topamax, Primadone for prophylaxis; they did not help.  Was on Elavil and found it helpful.  Tries to eat healthy. Currently living in an Extended Stay waiting for an apartment to become available.  So he has been eating out more but even so he tries to make good food choices.  Walks daily and does sit-up.  Drinks 1 cup coffee a day and drinks a lot of ice tea.  Past medical, social, family history that are in the system were reviewed.  Outpatient Encounter Medications as of 06/06/2020  Medication Sig  . amLODipine (NORVASC) 10 MG tablet Take 10 mg by mouth daily.   Marland Kitchen atorvastatin (LIPITOR) 20 MG tablet Take 20 mg by mouth daily.  . busPIRone (BUSPAR) 30 MG tablet Take 30 mg by mouth 2 (two) times daily.  . DULoxetine (CYMBALTA) 20 MG capsule Take 40 mg by mouth daily.  . hydrOXYzine (ATARAX/VISTARIL) 50 MG tablet Take 50 mg by mouth daily as needed for anxiety.   Marland Kitchen levothyroxine (SYNTHROID) 50 MCG tablet Take 50 mcg by mouth daily.  Marland Kitchen lisinopril (ZESTRIL) 20 MG tablet Take 20 mg by mouth daily.  Marland Kitchen LORazepam (ATIVAN) 0.5 MG tablet Take 0.5 mg by mouth daily as needed for anxiety.   . Melatonin 10 MG TABS Take 10 mg by mouth daily as needed (sleep).  . SUMAtriptan (IMITREX)  100 MG tablet Take 100 mg by mouth daily as needed for migraine. May repeat in 2 hours if headache persists or recurs.  . ziprasidone (GEODON) 40 MG capsule Take 40 mg by mouth 2 (two) times daily with a meal.  . ibuprofen (ADVIL) 800 MG tablet Take 800 mg by mouth every 8 (eight) hours as needed for moderate pain.    No facility-administered encounter medications on file as of 06/06/2020.      Observations/Objective:    Chemistry      Component Value Date/Time   NA 139 04/30/2020 1845   K 4.2  04/30/2020 1845   CL 103 04/30/2020 1845   CO2 25 04/30/2020 1845   BUN 12 04/30/2020 1845   CREATININE 0.97 04/30/2020 1845      Component Value Date/Time   CALCIUM 9.3 04/30/2020 1845     Lab Results  Component Value Date   WBC 6.2 04/30/2020   HGB 13.5 04/30/2020   HCT 38.7 (L) 04/30/2020   MCV 92.8 04/30/2020   PLT 208 04/30/2020   Depression screen PHQ 2/9 06/06/2020  Down, Depressed, Hopeless 3  PHQ - 2 Score 3  Altered sleeping 3  Tired, decreased energy 3  Change in appetite 0  Feeling bad or failure about yourself  3  Trouble concentrating 3  Moving slowly or fidgety/restless 0  Suicidal thoughts 0  PHQ-9 Score 15   GAD 7 : Generalized Anxiety Score 06/06/2020  Nervous, Anxious, on Edge 3  Control/stop worrying 3  Worry too much - different things 3  Trouble relaxing 3  Restless 0  Easily annoyed or irritable 3  Afraid - awful might happen 2  Total GAD 7 Score 17     Assessment and Plan:  1. Establishing care with new doctor, encounter for   2. Essential hypertension Continue current medications and low-salt diet - lisinopril (ZESTRIL) 20 MG tablet; Take 1 tablet (20 mg total) by mouth daily.  Dispense: 30 tablet; Refill: 3 - amLODipine (NORVASC) 10 MG tablet; Take 1 tablet (10 mg total) by mouth daily.  Dispense: 30 tablet; Refill: 3  3. Hyperlipidemia, unspecified hyperlipidemia type Continue atorvastatin. - Lipid panel; Future - Hepatic Function Panel; Future  4. Migraine with aura and without status migrainosus, not intractable Based on his history he really has chronic migraines.  Would benefit from referral to neurologist once he has insurance the beginning of next month.  Continue Imitrex.  He really needs a prophylactic medication but I am reluctant to restart amitriptyline given that he is on several psychiatric medications including Cymbalta, Geodon and BuSpar.  Reports he did not find Topamax helpful in the past. Discussed the importance  of getting adequate sleep, healthy eating habits and regular exercise and to avoid drinking too much caffeinated beverages.  5. Generalized anxiety disorder Continue his current medications he will get plugged in with mental health provider next week.  He already has an appointment  6. Hypothyroidism, unspecified type - levothyroxine (SYNTHROID) 50 MCG tablet; Take 1 tablet (50 mcg total) by mouth daily.  Dispense: 30 tablet; Refill: 3 - TSH+T4F+T3Free; Future  7. Low testosterone We will refer to urology once he has insurance  8. Moderate episode of recurrent major depressive disorder (HCC) See #5 above  9. Need for influenza vaccination He will come to the lab next week for blood draw.  At that time he is agreeable to receiving the flu shot.  Follow Up Instructions: 7 wks   I discussed the assessment and  treatment plan with the patient. The patient was provided an opportunity to ask questions and all were answered. The patient agreed with the plan and demonstrated an understanding of the instructions.   The patient was advised to call back or seek an in-person evaluation if the symptoms worsen or if the condition fails to improve as anticipated.  I provided 28 minutes of non-face-to-face time during this encounter.   Jonah Blue, MD

## 2020-06-09 ENCOUNTER — Encounter (HOSPITAL_COMMUNITY): Payer: Self-pay | Admitting: Psychiatry

## 2020-06-09 ENCOUNTER — Ambulatory Visit (INDEPENDENT_AMBULATORY_CARE_PROVIDER_SITE_OTHER): Payer: Self-pay | Admitting: Psychiatry

## 2020-06-09 ENCOUNTER — Other Ambulatory Visit: Payer: Self-pay

## 2020-06-09 VITALS — BP 125/91 | HR 84 | Ht 70.0 in | Wt 200.0 lb

## 2020-06-09 DIAGNOSIS — F330 Major depressive disorder, recurrent, mild: Secondary | ICD-10-CM | POA: Insufficient documentation

## 2020-06-09 DIAGNOSIS — F411 Generalized anxiety disorder: Secondary | ICD-10-CM

## 2020-06-09 MED ORDER — ZIPRASIDONE HCL 40 MG PO CAPS: 40.0000 mg | ORAL_CAPSULE | Freq: Two times a day (BID) | ORAL | 2 refills | Status: DC

## 2020-06-09 MED ORDER — LORAZEPAM 0.5 MG PO TABS: 0.5000 mg | ORAL_TABLET | Freq: Every day | ORAL | 0 refills | Status: DC | PRN

## 2020-06-09 MED ORDER — DULOXETINE HCL 60 MG PO CPEP: 60.0000 mg | ORAL_CAPSULE | Freq: Every day | ORAL | 2 refills | Status: DC

## 2020-06-09 MED ORDER — HYDROXYZINE HCL 50 MG PO TABS: 50.0000 mg | ORAL_TABLET | Freq: Three times a day (TID) | ORAL | 2 refills | Status: DC | PRN

## 2020-06-09 MED ORDER — BUSPIRONE HCL 30 MG PO TABS: 30.0000 mg | ORAL_TABLET | Freq: Two times a day (BID) | ORAL | 2 refills | Status: DC
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
## 2020-06-18 ENCOUNTER — Other Ambulatory Visit: Payer: Self-pay

## 2020-06-18 ENCOUNTER — Ambulatory Visit: Payer: Self-pay | Attending: Internal Medicine | Admitting: Pharmacist

## 2020-06-18 DIAGNOSIS — E039 Hypothyroidism, unspecified: Secondary | ICD-10-CM

## 2020-06-18 DIAGNOSIS — E785 Hyperlipidemia, unspecified: Secondary | ICD-10-CM

## 2020-06-18 DIAGNOSIS — Z23 Encounter for immunization: Secondary | ICD-10-CM

## 2020-06-18 NOTE — Progress Notes (Signed)
Patient presents for vaccination against influenza per orders of Dr. Johnson. Consent given. Counseling provided. No contraindications exists. Vaccine administered without incident.   Luke Van Ausdall, PharmD, BCACP, CPP Clinical Pharmacist [facility] [phone]  

## 2020-06-19 LAB — LIPID PANEL
Chol/HDL Ratio: 2.9 ratio (ref 0.0–5.0)
Cholesterol, Total: 120 mg/dL (ref 100–199)
HDL: 42 mg/dL (ref >39)
LDL Chol Calc (NIH): 59 mg/dL (ref 0–99)
Triglycerides: 101 mg/dL (ref 0–149)
VLDL Cholesterol Cal: 19 mg/dL (ref 5–40)

## 2020-06-19 LAB — HEPATIC FUNCTION PANEL
ALT: 45 IU/L — ABNORMAL HIGH (ref 0–44)
AST: 24 IU/L (ref 0–40)
Albumin: 4.7 g/dL (ref 4.0–5.0)
Alkaline Phosphatase: 82 IU/L (ref 44–121)
Bilirubin Total: 0.4 mg/dL (ref 0.0–1.2)
Bilirubin, Direct: 0.12 mg/dL (ref 0.00–0.40)
Total Protein: 6.8 g/dL (ref 6.0–8.5)

## 2020-06-19 LAB — TSH+T4F+T3FREE
Free T4: 1.08 ng/dL (ref 0.82–1.77)
T3, Free: 3.2 pg/mL (ref 2.0–4.4)
TSH: 1.77 u[IU]/mL (ref 0.450–4.500)

## 2020-07-14 ENCOUNTER — Other Ambulatory Visit (HOSPITAL_COMMUNITY): Payer: Self-pay | Admitting: Psychiatry

## 2020-07-14 ENCOUNTER — Telehealth (HOSPITAL_COMMUNITY): Payer: Self-pay

## 2020-07-14 DIAGNOSIS — F411 Generalized anxiety disorder: Secondary | ICD-10-CM

## 2020-07-14 MED ORDER — LORAZEPAM 0.5 MG PO TABS: 0.5000 mg | ORAL_TABLET | Freq: Every day | ORAL | 0 refills | Status: DC | PRN

## 2020-07-14 NOTE — Telephone Encounter (Signed)
Medication refill request - patient left a message requesting a refill of his prescribed Lorazepam.  Stated he only had a 1 month supply and would like a refill as he has refills of other medications.

## 2020-07-14 NOTE — Telephone Encounter (Signed)
Medication refilled and sent to preferred pharmacy

## 2020-07-14 NOTE — Telephone Encounter (Signed)
Medication management - Telephone call with patient to inform Dr. Doyne Keel had sent in his requested Lorazepam refill to patient's [facility].  Patient to call back if any issues filling.

## 2020-07-16 ENCOUNTER — Encounter (HOSPITAL_COMMUNITY): Payer: Self-pay

## 2020-07-16 ENCOUNTER — Ambulatory Visit (HOSPITAL_COMMUNITY)
Admission: EM | Admit: 2020-07-16 | Discharge: 2020-07-16 | Disposition: A | Discharge status: Home / Self Care | Payer: 59 | Attending: Emergency Medicine | Admitting: Emergency Medicine

## 2020-07-16 ENCOUNTER — Other Ambulatory Visit: Payer: Self-pay

## 2020-07-16 DIAGNOSIS — M62838 Other muscle spasm: Secondary | ICD-10-CM | POA: Diagnosis not present

## 2020-07-16 MED ORDER — CYCLOBENZAPRINE HCL 5 MG PO TABS: 5.0000 mg | ORAL_TABLET | Freq: Three times a day (TID) | ORAL | 0 refills | Status: DC | PRN

## 2020-07-16 NOTE — ED Triage Notes (Signed)
Pt c/o acute left sided neck stiffness upon waking approx 3 days ago.  Denies injury to area. Took 800mg  ibuprofen this morning.

## 2020-07-16 NOTE — ED Provider Notes (Signed)
[facility]    CSN: [id number] Arrival date & time: 07/16/20  1123      History   Chief Complaint Chief Complaint  Patient presents with  . Torticollis    HPI Samuel Ewing is a 50 y.o. male.   Samuel Ewing presents with complaints of left upper back/neck muscle pain and tightness which started three days ago, he woke with it. No specific injury. 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 Pain with turning head left. Tylenol and ibuprofen have not helped with symptoms. Denies any previous similar. No headache or vision changes. No distal numbness or tingling.     ROS per HPI, negative if not otherwise mentioned.      Past Medical History:  Diagnosis Date  . Anxiety   . Hypertension   . Migraine     Patient Active Problem List   Diagnosis Date Noted  . Mild episode of recurrent major depressive disorder (HCC) 06/09/2020  . Essential hypertension 06/06/2020  . Hyperlipidemia 06/06/2020  . Migraine with aura and without status migrainosus, not intractable 06/06/2020  . Generalized anxiety disorder 06/06/2020  . Hypothyroidism 06/06/2020  . 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 Moderate episode of recurrent major depressive disorder (HCC) 06/06/2020    History reviewed. No pertinent surgical history.     Home Medications    Prior to Admission medications   Medication Sig Start Date End Date Taking? Authorizing Provider  amLODipine (NORVASC) 10 MG tablet Take 1 tablet (10 mg total) by mouth daily. 06/06/20  Yes Marcine Matar, MD  atorvastatin (LIPITOR) 20 MG tablet Take 1 tablet (20 mg total) by mouth daily. 06/06/20  Yes Marcine Matar, MD  busPIRone (BUSPAR) 30 MG tablet Take 1 tablet (30 mg total) by mouth 2 (two) times daily. 06/09/20  Yes Toy Cookey E, NP  cyclobenzaprine (FLEXERIL) 5 MG tablet Take 1 tablet (5 mg total) by mouth 3 (three) times daily as needed for muscle spasms. 07/16/20  Yes  Burky, Dorene Grebe B, NP  DULoxetine (CYMBALTA) 60 MG capsule Take 1 capsule (60 mg total) by mouth daily. 06/09/20  Yes Toy Cookey E, NP  hydrOXYzine (ATARAX/VISTARIL) 50 MG tablet Take 1 tablet (50 mg total) by mouth 3 (three) times daily as needed for anxiety. 06/09/20  Yes Toy Cookey E, NP  ibuprofen (ADVIL) 800 MG tablet Take 800 mg by mouth every 8 (eight) hours as needed for moderate pain.  03/24/20  Yes [provider]  levothyroxine (SYNTHROID) 50 MCG tablet Take 1 tablet (50 mcg total) by mouth daily. 06/06/20  Yes Marcine Matar, MD  lisinopril (ZESTRIL) 20 MG tablet Take 1 tablet (20 mg total) by mouth daily. 06/06/20  Yes Marcine Matar, MD  LORazepam (ATIVAN) 0.5 MG tablet Take 1 tablet (0.5 mg total) by mouth daily as needed for anxiety. 07/14/20  Yes Toy Cookey E, NP  ziprasidone (GEODON) 40 MG capsule Take 1 capsule (40 mg total) by mouth 2 (two) times daily with a meal. 06/09/20  Yes Toy Cookey E, NP  SUMAtriptan (IMITREX) 100 MG tablet Take 100 mg by mouth daily as needed for migraine. May repeat in 2 hours if headache persists or recurs.    [provider]    Family History Family History  Problem Relation Age of Onset  . Heart failure Mother   . Heart failure Father   . Heart attack Father     Social History Social History  Tobacco Use  . Smoking status: Never Smoker  . Smokeless tobacco: Never Used  Vaping Use  . Vaping Use: Every day  . Substances: Nicotine  Substance Use Topics  . Alcohol use: Never  . Drug use: Yes    Types: Marijuana     Allergies   Morphine and related   Review of Systems Review of Systems   Physical Exam Triage Vital Signs ED Triage Vitals  Enc Vitals Group     BP 07/16/20 1229 114/80     Pulse Rate 07/16/20 1229 96     Resp 07/16/20 1229 18     Temp 07/16/20 1229 98.5 F (36.9 C)     Temp Source 07/16/20 1229 Oral     SpO2 07/16/20 1229 96 %     Weight --      Height  --      Head Circumference --      Peak Flow --      Pain Score 07/16/20 1226 7     Pain Loc --      Pain Edu? --      Excl. in GC? --    No data found.  Updated Vital Signs BP 114/80 (BP Location: Right Arm)   Pulse 96   Temp 98.5 F (36.9 C) (Oral)   Resp 18   SpO2 96%    Physical Exam Constitutional:      Appearance: He is well-developed.  Neck:      Comments: Left trapezius with mild tenderness, pain with rotation of neck to left; (ain with engagement of left trapezius; no bony tenderness; full ROM to upper extremities Cardiovascular:     Rate and Rhythm: Normal rate.  Pulmonary:     Effort: Pulmonary effort is normal.  Musculoskeletal:     Cervical back: Muscular tenderness present. No spinous process tenderness.  Skin:    General: Skin is warm and dry.  Neurological:     Mental Status: He is alert and oriented to person, place, and time.      UC Treatments / Results  Labs (all labs ordered are listed, but only abnormal results are displayed) Labs Reviewed - No data to display  EKG   Radiology No results found.  Procedures Procedures (including critical care time)  Medications Ordered in UC Medications - No data to display  Initial Impression / Assessment and Plan / UC Course  I have reviewed the triage vital signs and the nursing notes.  Pertinent labs & imaging results that were available during my care of the patient were reviewed by me and considered in my medical decision making (see chart for details).     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 No red flag findings on exam.  Pain management discussed. Return precautions provided. Patient verbalized understanding and agreeable to plan.   Final Clinical Impressions(s) / UC Diagnoses   Final diagnoses:  Trapezius muscle spasm     Discharge Instructions     Heat, massage, stretching of the affected area.  May  continue with tylenol and/or ibuprofen as needed for pain. Flexeril as needed as a muscle relaxer. May cause drowsiness. Please do not take if driving or drinking alcohol.   Follow up with your primary care provider as needed if symptoms persist.     ED Prescriptions    Medication Sig Dispense Auth. Provider   cyclobenzaprine (FLEXERIL) 5 MG tablet Take 1 tablet (  5 mg total) by mouth 3 (three) times daily as needed for muscle spasms. 15 tablet Georgetta Haber, NP     PDMP not reviewed this encounter.   Georgetta Haber, NP 07/16/20 1329

## 2020-07-16 NOTE — Discharge Instructions (Signed)
Heat, massage, stretching of the affected area.  May continue with tylenol and/or ibuprofen as needed for pain. Flexeril as needed as a muscle relaxer. May cause drowsiness. Please do not take if driving or drinking alcohol.   Follow up with your primary care provider as needed if symptoms persist.

## 2020-07-19 ENCOUNTER — Emergency Department (HOSPITAL_COMMUNITY): Payer: 59

## 2020-07-19 ENCOUNTER — Encounter (HOSPITAL_COMMUNITY): Payer: Self-pay | Admitting: Emergency Medicine

## 2020-07-19 ENCOUNTER — Other Ambulatory Visit: Payer: Self-pay

## 2020-07-19 ENCOUNTER — Emergency Department (HOSPITAL_COMMUNITY)
Admission: EM | Admit: 2020-07-19 | Discharge: 2020-07-19 | Disposition: A | Discharge status: Home / Self Care | Payer: 59 | Attending: Emergency Medicine | Admitting: Emergency Medicine

## 2020-07-19 DIAGNOSIS — E039 Hypothyroidism, unspecified: Secondary | ICD-10-CM | POA: Diagnosis not present

## 2020-07-19 DIAGNOSIS — M25512 Pain in left shoulder: Secondary | ICD-10-CM

## 2020-07-19 DIAGNOSIS — Z79899 Other long term (current) drug therapy: Secondary | ICD-10-CM | POA: Diagnosis not present

## 2020-07-19 DIAGNOSIS — I10 Essential (primary) hypertension: Secondary | ICD-10-CM | POA: Diagnosis not present

## 2020-07-19 IMAGING — CR DG SHOULDER 2+V*L*
3 series · 3 of 3 positions shown · non-contrast
Comparison: [DATE]

CLINICAL DATA: Left shoulder pain for 4 days

EXAM:
LEFT SHOULDER - 2+ VIEW

[w shoulder external left]
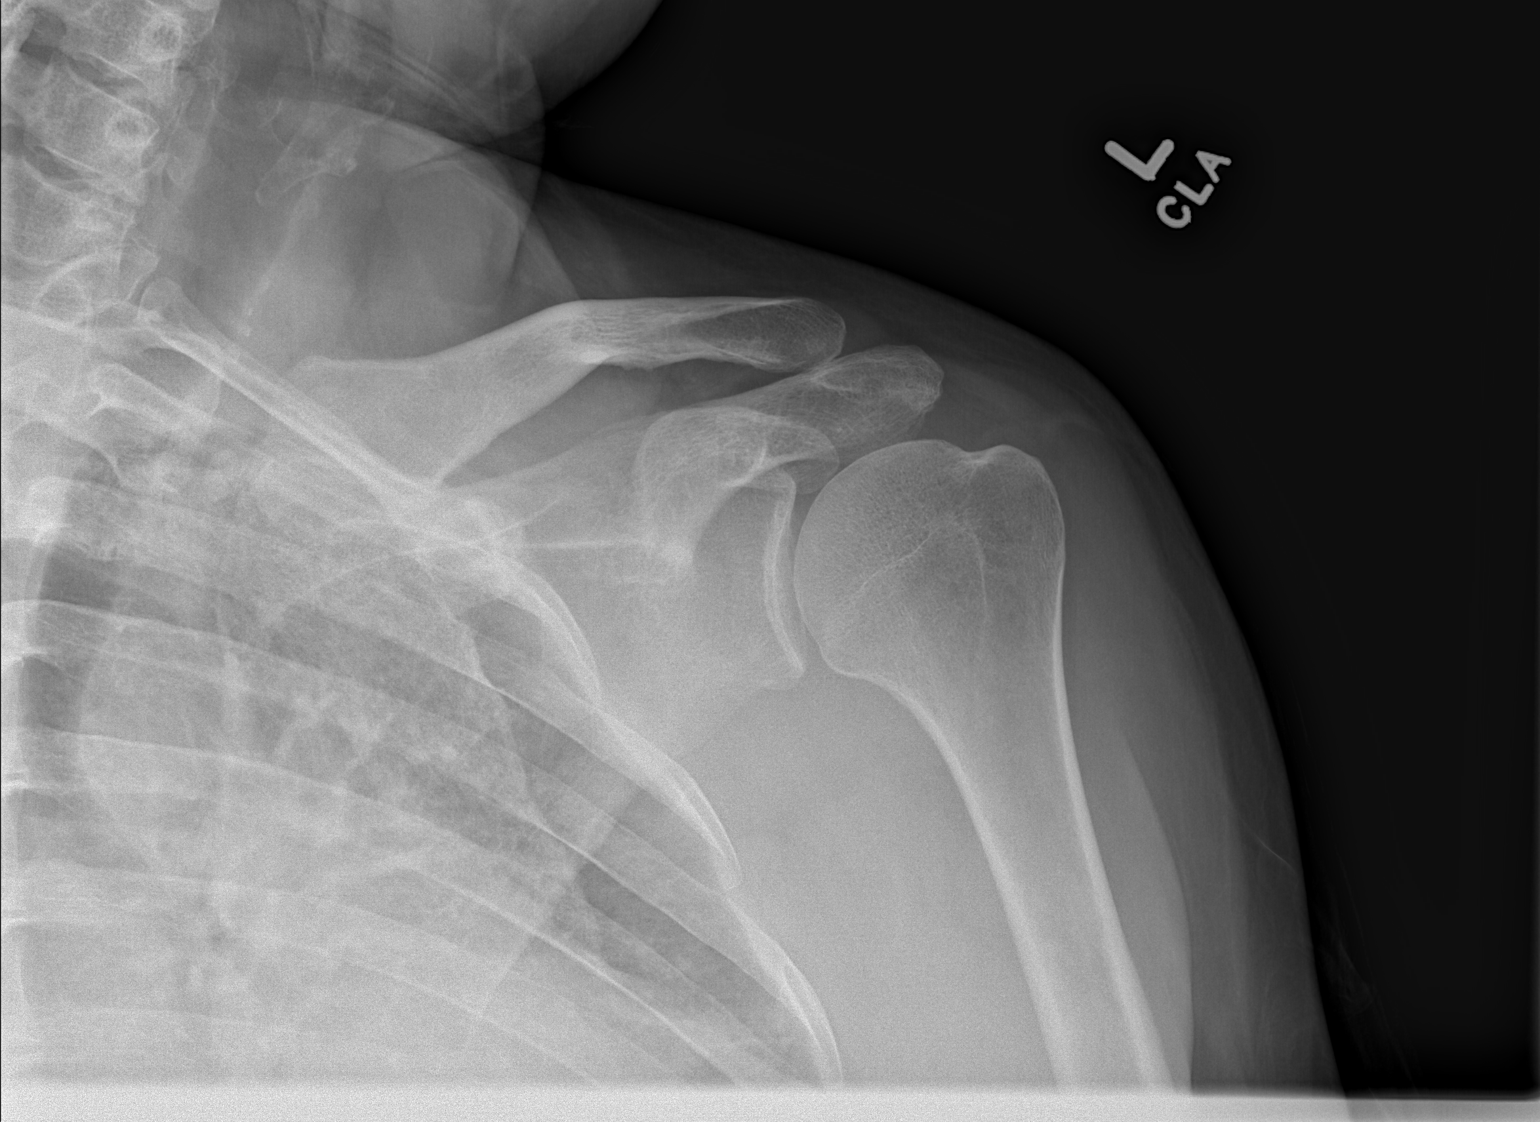

[w shoulder y-view left]
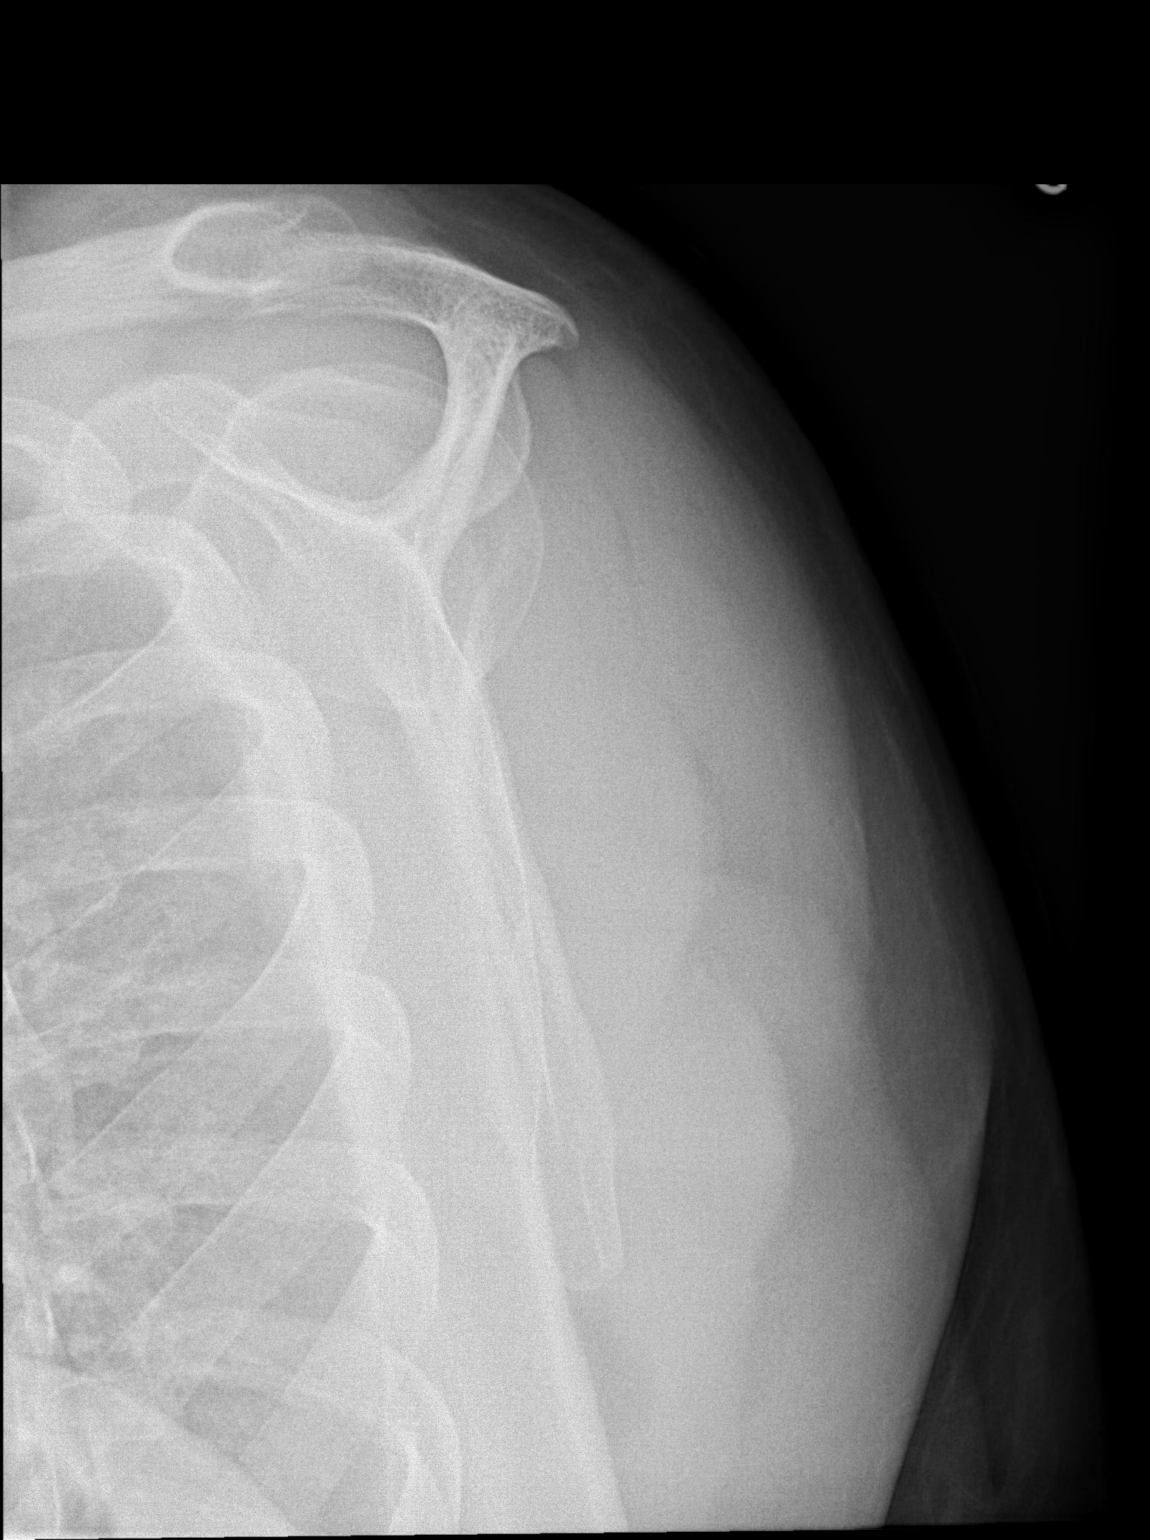

[x shoulder axillary left]
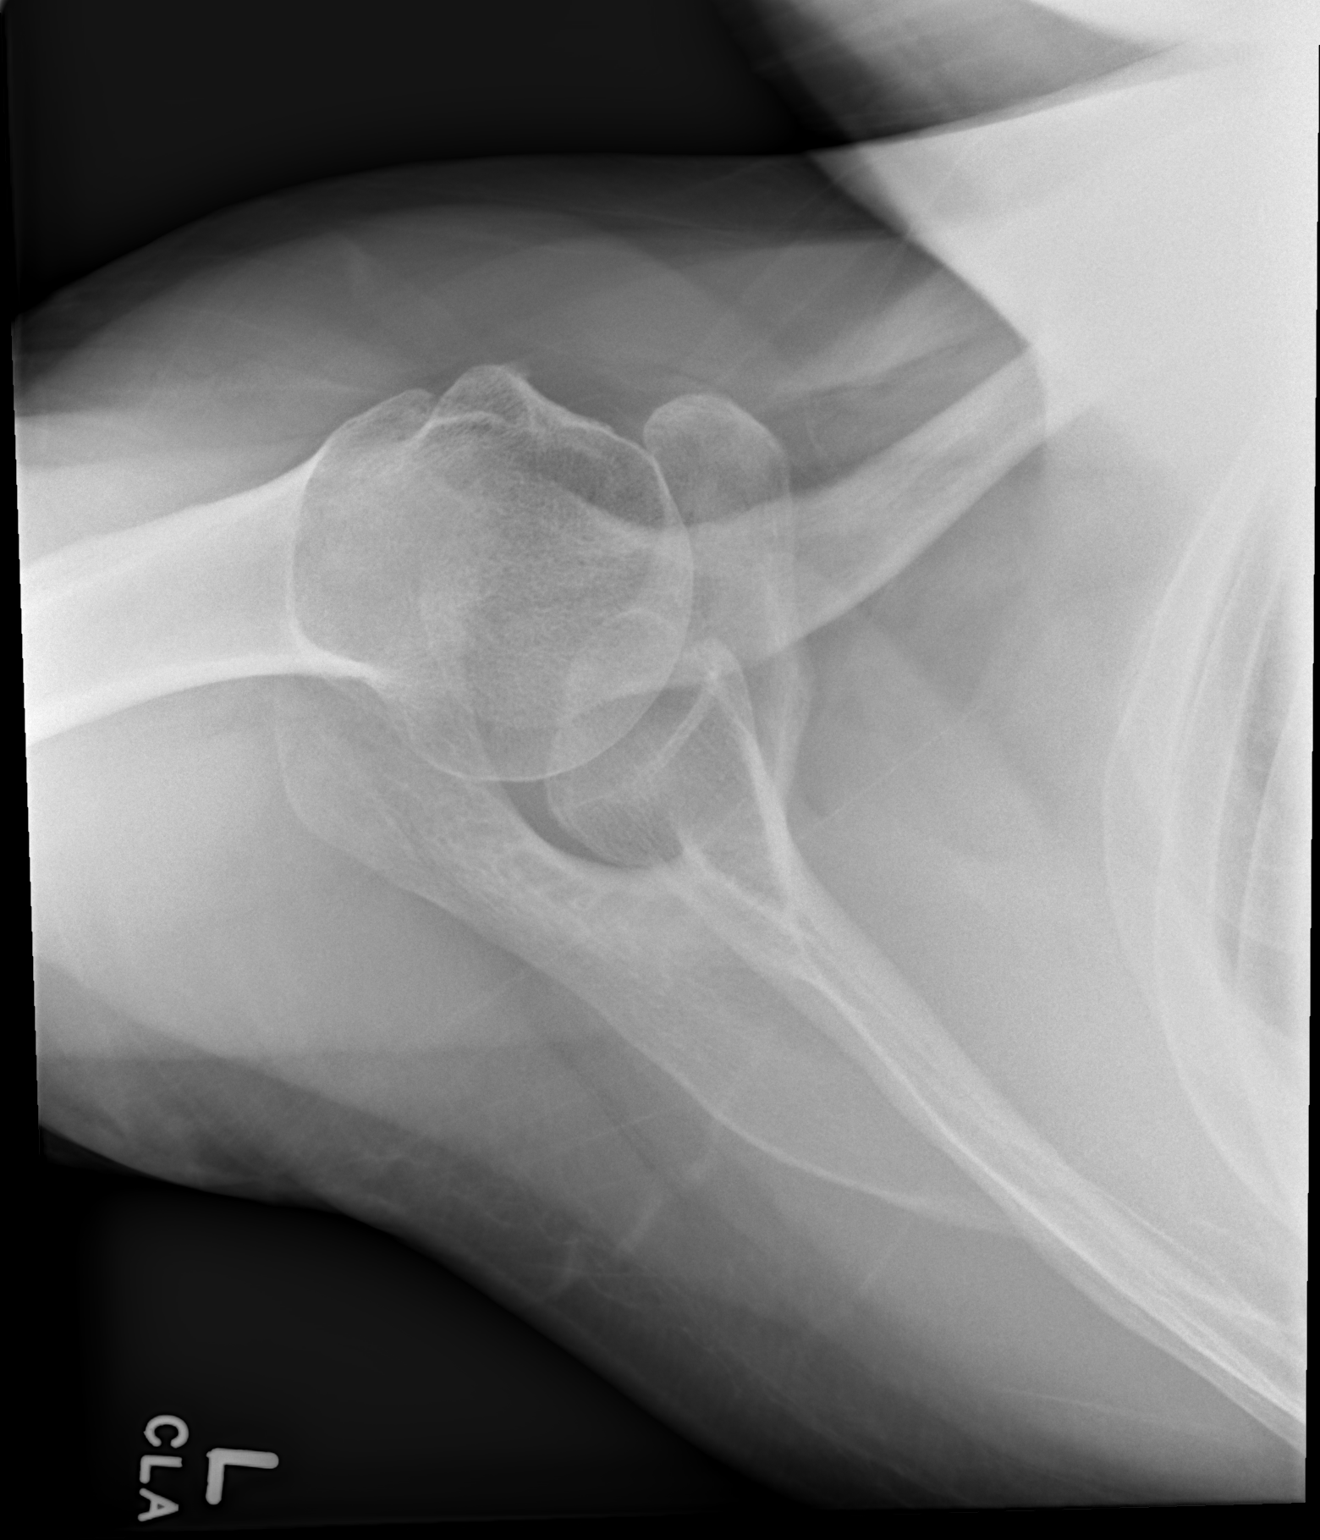

[3 of 3 positions shown; findings below may reference images not displayed]

FINDINGS: Frontal, transscapular, and axillary views of the left shoulder are
obtained. No fracture, subluxation, or dislocation. Joint spaces are
well preserved. Left chest is clear.
IMPRESSION: 1. Unremarkable left shoulder.

## 2020-07-19 MED ORDER — KETOROLAC TROMETHAMINE 15 MG/ML IJ SOLN
30.0000 mg | Freq: Once | INTRAMUSCULAR | Status: AC
  Administered 2020-07-19: 30 mg via INTRAMUSCULAR
  Filled 2020-07-19: qty 2

## 2020-07-19 MED ORDER — MELOXICAM 15 MG PO TABS: 15.0000 mg | ORAL_TABLET | Freq: Every day | ORAL | 0 refills | Status: DC

## 2020-07-19 NOTE — ED Provider Notes (Signed)
[facility]EMERGENCY [facility]PT Provider Note   CSN: [id number] Arrival date & time: 07/19/20  1932     History Chief Complaint  Patient presents with  . Shoulder Pain    Samuel Ewing is a 50 y.o. male.  Patient presents the emergency department for evaluation of left shoulder pain for several days.  Patient describes a sharp pain in his shoulder that is worse with movement especially when he extends his neck or when he has lateral bending towards the right shoulder.  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  He states that he saw a chiropractor who did an x-ray and told him that there was nothing there.  He was at an urgent care and was prescribed ibuprofen and Flexeril without improvement.  Patient denies numbness, tingling, or weakness into the arm.  He is able to lift up above his shoulder and put it behind his back but this causes increase in pain.  No lower extremity symptoms.  No chest pain or shortness of breath, cough, fevers or difficulty breathing.        Past Medical History:  Diagnosis Date  . Anxiety   . Hypertension   . Migraine     Patient Active Problem List   Diagnosis Date Noted  . Mild episode of recurrent major depressive disorder (HCC) 06/09/2020  . Essential hypertension 06/06/2020  . Hyperlipidemia 06/06/2020  . Migraine with aura and without status migrainosus, not intractable 06/06/2020  . Generalized anxiety disorder 06/06/2020  . Hypothyroidism 06/06/2020  . 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 Moderate episode of recurrent major depressive disorder (HCC) 06/06/2020    History reviewed. No pertinent surgical history.     Family History  Problem Relation Age of Onset  . Heart failure Mother   . Heart failure Father   . Heart attack Father     Social History   Tobacco Use  . Smoking status: Never Smoker  . Smokeless tobacco: Never Used  Vaping Use  . Vaping Use: Every day  . Substances: Nicotine  Substance Use  Topics  . Alcohol use: Never  . Drug use: Yes    Types: Marijuana    Home Medications Prior to Admission medications   Medication Sig Start Date End Date Taking? Authorizing Provider  amLODipine (NORVASC) 10 MG tablet Take 1 tablet (10 mg total) by mouth daily. 06/06/20   Marcine Matar, MD  atorvastatin (LIPITOR) 20 MG tablet Take 1 tablet (20 mg total) by mouth daily. 06/06/20   Marcine Matar, MD  busPIRone (BUSPAR) 30 MG tablet Take 1 tablet (30 mg total) by mouth 2 (two) times daily. 06/09/20   Shanna Cisco, NP  cyclobenzaprine (FLEXERIL) 5 MG tablet Take 1 tablet (5 mg total) by mouth 3 (three) times daily as needed for muscle spasms. 07/16/20   Georgetta Haber, NP  DULoxetine (CYMBALTA) 60 MG capsule Take 1 capsule (60 mg total) by mouth daily. 06/09/20   Shanna Cisco, NP  hydrOXYzine (ATARAX/VISTARIL) 50 MG tablet Take 1 tablet (50 mg total) by mouth 3 (three) times daily as needed for anxiety. 06/09/20   Shanna Cisco, NP  ibuprofen (ADVIL) 800 MG tablet Take 800 mg by mouth every 8 (eight) hours as needed for moderate pain.  03/24/20   [provider]  levothyroxine (SYNTHROID) 50 MCG tablet Take 1 tablet (50 mcg total) by mouth daily. 06/06/20   Marcine Matar, MD  lisinopril (ZESTRIL) 20 MG tablet Take 1 tablet (20 mg  total) by mouth daily. 06/06/20   Marcine Matar, MD  LORazepam (ATIVAN) 0.5 MG tablet Take 1 tablet (0.5 mg total) by mouth daily as needed for anxiety. 07/14/20   Shanna Cisco, NP  SUMAtriptan (IMITREX) 100 MG tablet Take 100 mg by mouth daily as needed for migraine. May repeat in 2 hours if headache persists or recurs.    [provider]  ziprasidone (GEODON) 40 MG capsule Take 1 capsule (40 mg total) by mouth 2 (two) times daily with a meal. 06/09/20   Shanna Cisco, NP    Allergies    Morphine and related  Review of Systems   Review of Systems  Constitutional: Negative for fever.  Respiratory:  Negative for cough and shortness of breath.   Cardiovascular: Negative for chest pain.  Musculoskeletal: Positive for arthralgias and myalgias.  Neurological: Negative for weakness and numbness.    Physical Exam Updated Vital Signs BP 135/87 (BP Location: Right Arm)   Temp 98.5 F (36.9 C) (Oral)   Resp 18   SpO2 98%   Physical Exam Vitals and nursing note reviewed.  Constitutional:      Appearance: He is well-developed and well-nourished.  HENT:     Head: Normocephalic and atraumatic.  Eyes:     Conjunctiva/sclera: Conjunctivae normal.  Cardiovascular:     Pulses: Normal pulses. No decreased pulses.          Radial pulses are 2+ on the right side and 2+ on the left side.  Musculoskeletal:        General: Tenderness present. No edema.     Left shoulder: Tenderness present. No bony tenderness. Normal range of motion.     Right upper arm: No tenderness.     Left upper arm: No tenderness.     Left elbow: Normal range of motion. No tenderness.     Cervical back: Normal range of motion and neck supple. No bony tenderness.     Thoracic back: No tenderness or bony tenderness.  Skin:    General: Skin is warm and dry.  Neurological:     Mental Status: He is alert.     Sensory: No sensory deficit.     Comments: Motor, sensation, and vascular distal to the injury is fully intact.   Psychiatric:        Mood and Affect: Mood and affect normal.     ED Results / Procedures / Treatments   Labs (all labs ordered are listed, but only abnormal results are displayed) Labs Reviewed - No data to display  EKG None  Radiology No results found.  Procedures Procedures (including critical care time)  Medications Ordered in ED Medications  ketorolac (TORADOL) 15 MG/ML injection 30 mg (has no administration in time range)    ED Course  I have reviewed the triage vital signs and the nursing notes.  Pertinent labs & imaging results that were available during my care of the patient  were reviewed by me and considered in my medical decision making (see chart for details).  Patient seen and examined.  Will check chest x-ray.  Will give IM Toradol.  Will be able to be discharged home with orthopedic referral.  Vital signs reviewed and are as follows: BP 135/87 (BP Location: Right Arm)   Temp 98.5 F (36.9 C) (Oral)   Resp 18   SpO2 98%   9:22 PM X-ray reviewed.  Read is negative.  Patient will be discharged home with orthopedic follow-up, meloxicam.  Encouraged rice  protocol.    MDM Rules/Calculators/A&P                          Patient with acute pain of left shoulder, negative imaging.  No respiratory symptoms.  No fever or other symptoms of illness.  Left upper extremity is neurovascularly intact.   Final Clinical Impression(s) / ED Diagnoses Final diagnoses:  Acute pain of left shoulder    Rx / DC Orders ED Discharge Orders    None       Desmond Dike 07/19/20 2122    Bethann Berkshire, MD 07/19/20 2222

## 2020-07-19 NOTE — ED Triage Notes (Signed)
Patient presents for left shoulder pain that began a few days ago. Patient states no injury, sudden onset of pain. Patient was seen at urgent care and prescribed flexeril and ibuprofen. Patient states he has been attempting to use interventions without success. Patient states pain continues to increase.

## 2020-07-19 NOTE — Discharge Instructions (Signed)
Please read and follow all provided instructions.  Your diagnoses today include:  1. Acute pain of left shoulder     Tests performed today include:  An x-ray of the affected area - does NOT show any broken bones  Vital signs. See below for your results today.   Medications prescribed:   Meloxicam - anti-inflammatory pain medication  You have been prescribed an anti-inflammatory medication or NSAID. Take with food. Do not take aspirin, ibuprofen, or naproxen if taking this medication. Take smallest effective dose for the shortest duration needed for your pain. Stop taking if you experience stomach pain or vomiting.   Take any prescribed medications only as directed.  Home care instructions:   Follow any educational materials contained in this packet  Follow R.I.C.E. Protocol:  R - rest your injury   I  - use ice on injury without applying directly to skin  C - compress injury with bandage or splint  E - elevate the injury as much as possible  Follow-up instructions: Please follow-up with your primary care provider or the provided orthopedic physician (bone specialist) if you continue to have significant pain in 1 week.  Return instructions:   Please return if your fingers are numb or tingling, appear gray or blue, or you have severe pain (also elevate the arm and loosen splint or wrap if you were given one)  Please return to the Emergency Department if you experience worsening symptoms.   Please return if you have any other emergent concerns.  Additional Information:  Your vital signs today were: BP 135/87 (BP Location: Right Arm)    Temp 98.5 F (36.9 C) (Oral)    Resp 18    SpO2 98%  If your blood pressure (BP) was elevated above 135/85 this visit, please have this repeated by your doctor within one month. --------------

## 2020-07-20 ENCOUNTER — Encounter: Payer: Self-pay | Admitting: Internal Medicine

## 2020-08-11 ENCOUNTER — Other Ambulatory Visit (HOSPITAL_COMMUNITY): Payer: Self-pay | Admitting: Psychiatry

## 2020-08-11 DIAGNOSIS — F411 Generalized anxiety disorder: Secondary | ICD-10-CM

## 2020-08-12 ENCOUNTER — Emergency Department (HOSPITAL_COMMUNITY)
Admission: EM | Admit: 2020-08-12 | Discharge: 2020-08-12 | Disposition: A | Discharge status: Home / Self Care | Payer: 59 | Attending: Emergency Medicine | Admitting: Emergency Medicine

## 2020-08-12 ENCOUNTER — Other Ambulatory Visit: Payer: Self-pay

## 2020-08-12 ENCOUNTER — Encounter (HOSPITAL_COMMUNITY): Payer: Self-pay | Admitting: Emergency Medicine

## 2020-08-12 DIAGNOSIS — E039 Hypothyroidism, unspecified: Secondary | ICD-10-CM | POA: Insufficient documentation

## 2020-08-12 DIAGNOSIS — G8929 Other chronic pain: Secondary | ICD-10-CM | POA: Diagnosis not present

## 2020-08-12 DIAGNOSIS — Z79899 Other long term (current) drug therapy: Secondary | ICD-10-CM | POA: Insufficient documentation

## 2020-08-12 DIAGNOSIS — M25512 Pain in left shoulder: Secondary | ICD-10-CM | POA: Insufficient documentation

## 2020-08-12 DIAGNOSIS — I10 Essential (primary) hypertension: Secondary | ICD-10-CM | POA: Diagnosis not present

## 2020-08-12 DIAGNOSIS — M546 Pain in thoracic spine: Secondary | ICD-10-CM | POA: Insufficient documentation

## 2020-08-12 MED ORDER — LIDOCAINE 5 % EX PTCH
1.0000 | MEDICATED_PATCH | CUTANEOUS | Status: DC
  Administered 2020-08-12: 1 via TRANSDERMAL
  Filled 2020-08-12: qty 1

## 2020-08-12 MED ORDER — LIDOCAINE 5 % EX PTCH: 1.0000 | MEDICATED_PATCH | CUTANEOUS | 0 refills | Status: DC

## 2020-08-12 MED ORDER — KETOROLAC TROMETHAMINE 30 MG/ML IJ SOLN
30.0000 mg | Freq: Once | INTRAMUSCULAR | Status: AC
  Administered 2020-08-12: 30 mg via INTRAMUSCULAR
  Filled 2020-08-12: qty 1

## 2020-08-12 NOTE — ED Triage Notes (Signed)
Patient complains of L shoulder pain, states he had a MRI yesterday and has a follow up appt about the results next week, but he could not sleep last night due to the pain.

## 2020-08-12 NOTE — ED Provider Notes (Signed)
[facility]-EMERGENCY DEPT Provider Note   CSN: [id number] Arrival date & time: 08/12/20  1052     History No chief complaint on file.   MONTAVIUS SUBRAMANIAM is a 50 y.o. male.  50 year old male with history of anxiety, hypertension, migraines presents with complaint of ongoing upper back and left shoulder pain. Patient states that he had an MRI of the area yesterday, states his orthopedic surgeon suspects he has a ruptured or bulging disc. Patient is taking his Cymbalta, gabapentin, Percocet, baclofen without improvement in his pain. Denies new injuries or changes in his chronic pain pattern.        Past Medical History:  Diagnosis Date  . Anxiety   . Hypertension   . Migraine     Patient Active Problem List   Diagnosis Date Noted  . Mild episode of recurrent major depressive disorder (HCC) 06/09/2020  . Essential hypertension 06/06/2020  . Hyperlipidemia 06/06/2020  . Migraine with aura and without status migrainosus, not intractable 06/06/2020  . Generalized anxiety disorder 06/06/2020  . Hypothyroidism 06/06/2020  . 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 Moderate episode of recurrent major depressive disorder (HCC) 06/06/2020    History reviewed. No pertinent surgical history.     Family History  Problem Relation Age of Onset  . Heart failure Mother   . Heart failure Father   . Heart attack Father     Social History   Tobacco Use  . Smoking status: Never Smoker  . Smokeless tobacco: Never Used  Vaping Use  . Vaping Use: Every day  . Substances: Nicotine  Substance Use Topics  . Alcohol use: Never  . Drug use: Yes    Types: Marijuana    Home Medications Prior to Admission medications   Medication Sig Start Date End Date Taking? Authorizing Provider  lidocaine (LIDODERM) 5 % Place 1 patch onto the skin daily. Remove & Discard patch within 12 hours or as directed by MD 08/12/20  Yes Jeannie Fend, PA-C  amLODipine (NORVASC) 10 MG tablet  Take 1 tablet (10 mg total) by mouth daily. 06/06/20   Marcine Matar, MD  atorvastatin (LIPITOR) 20 MG tablet Take 1 tablet (20 mg total) by mouth daily. 06/06/20   Marcine Matar, MD  busPIRone (BUSPAR) 30 MG tablet Take 1 tablet (30 mg total) by mouth 2 (two) times daily. 06/09/20   Shanna Cisco, NP  cyclobenzaprine (FLEXERIL) 5 MG tablet Take 1 tablet (5 mg total) by mouth 3 (three) times daily as needed for muscle spasms. 07/16/20   Georgetta Haber, NP  DULoxetine (CYMBALTA) 60 MG capsule Take 1 capsule (60 mg total) by mouth daily. 06/09/20   Shanna Cisco, NP  hydrOXYzine (ATARAX/VISTARIL) 50 MG tablet Take 1 tablet (50 mg total) by mouth 3 (three) times daily as needed for anxiety. 06/09/20   Shanna Cisco, NP  levothyroxine (SYNTHROID) 50 MCG tablet Take 1 tablet (50 mcg total) by mouth daily. 06/06/20   Marcine Matar, MD  lisinopril (ZESTRIL) 20 MG tablet Take 1 tablet (20 mg total) by mouth daily. 06/06/20   Marcine Matar, MD  LORazepam (ATIVAN) 0.5 MG tablet Take 1 tablet (0.5 mg total) by mouth daily as needed for anxiety. 07/14/20   Shanna Cisco, NP  meloxicam (MOBIC) 15 MG tablet Take 1 tablet (15 mg total) by mouth daily. 07/19/20   Renne Crigler, PA-C  SUMAtriptan (IMITREX) 100 MG tablet Take 100 mg by mouth daily as needed for migraine.  May repeat in 2 hours if headache persists or recurs.    [provider]  ziprasidone (GEODON) 40 MG capsule Take 1 capsule (40 mg total) by mouth 2 (two) times daily with a meal. 06/09/20   Shanna Cisco, NP    Allergies    Morphine and related  Review of Systems   Review of Systems  Constitutional: Negative for fever.  Respiratory: Negative for shortness of breath.   Cardiovascular: Negative for chest pain.  Musculoskeletal: Positive for arthralgias and back pain.  Skin: Negative for rash and wound.  Allergic/Immunologic: Negative for immunocompromised state.  Neurological: Negative  for weakness.  All other systems reviewed and are negative.   Physical Exam Updated Vital Signs BP (!) 148/96 (BP Location: Right Arm)   Pulse (!) 103   Temp 97.9 F (36.6 C) (Oral)   Resp 17   SpO2 100%   Physical Exam Vitals and nursing note reviewed.  Constitutional:      General: He is not in acute distress.    Appearance: He is well-developed and well-nourished. He is not diaphoretic.  HENT:     Head: Normocephalic and atraumatic.  Cardiovascular:     Pulses: Normal pulses.  Pulmonary:     Effort: Pulmonary effort is normal.  Musculoskeletal:        General: No swelling, tenderness or deformity.     Comments: Reports pain to upper T-spine and left shoulder, no pain with palpation. Pain is reproduced with range of motion of the left shoulder. Equal grip strength, strong radial pulses present bilaterally.  Skin:    General: Skin is warm and dry.     Findings: No erythema or rash.  Neurological:     Mental Status: He is alert and oriented to person, place, and time.     Sensory: No sensory deficit.     Motor: No weakness.  Psychiatric:        Mood and Affect: Mood and affect normal.        Behavior: Behavior normal.     ED Results / Procedures / Treatments   Labs (all labs ordered are listed, but only abnormal results are displayed) Labs Reviewed - No data to display  EKG None  Radiology No results found.  Procedures Procedures   Medications Ordered in ED Medications  ketorolac (TORADOL) 30 MG/ML injection 30 mg (has no administration in time range)  lidocaine (LIDODERM) 5 % 1 patch (has no administration in time range)    ED Course  I have reviewed the triage vital signs and the nursing notes.  Pertinent labs & imaging results that were available during my care of the patient were reviewed by me and considered in my medical decision making (see chart for details).  Clinical Course as of 08/12/20 1128  Tue Aug 12, 2020  3929 50 year old male with  ongoing upper back pain, not relieved with medications previously prescribed. On exam, pain with ROM of the left shoulder. Unable to visualize MRI report from yesterday, scheduled to follow up with his ortho and reports scheduled for injection next week. Plan is to place lidoderm patch, IM toradol, rx for Lidoderm and follow up with ortho.  [LM]    Clinical Course User Index [LM] Alden Hipp   MDM Rules/Calculators/A&P                          Final Clinical Impression(s) / ED Diagnoses Final diagnoses:  Chronic  left-sided thoracic back pain    Rx / DC Orders ED Discharge Orders         Ordered    lidocaine (LIDODERM) 5 %  Every 24 hours        08/12/20 1124           Alden Hipp 08/12/20 1128    Mancel Bale, MD 08/12/20 1620

## 2020-08-12 NOTE — Discharge Instructions (Addendum)
Take your medications as prescribed. Follow-up with your orthopedist as discussed. Apply Lidoderm patches to area as prescribed.

## 2020-08-17 ENCOUNTER — Encounter (HOSPITAL_COMMUNITY): Payer: Self-pay

## 2020-08-17 ENCOUNTER — Emergency Department (HOSPITAL_COMMUNITY)
Admission: EM | Admit: 2020-08-17 | Discharge: 2020-08-17 | Disposition: A | Discharge status: Home / Self Care | Payer: 59 | Attending: Emergency Medicine | Admitting: Emergency Medicine

## 2020-08-17 ENCOUNTER — Other Ambulatory Visit: Payer: Self-pay

## 2020-08-17 DIAGNOSIS — I10 Essential (primary) hypertension: Secondary | ICD-10-CM | POA: Diagnosis not present

## 2020-08-17 DIAGNOSIS — G8929 Other chronic pain: Secondary | ICD-10-CM | POA: Diagnosis not present

## 2020-08-17 DIAGNOSIS — E039 Hypothyroidism, unspecified: Secondary | ICD-10-CM | POA: Insufficient documentation

## 2020-08-17 DIAGNOSIS — M542 Cervicalgia: Secondary | ICD-10-CM | POA: Insufficient documentation

## 2020-08-17 DIAGNOSIS — Z79899 Other long term (current) drug therapy: Secondary | ICD-10-CM | POA: Diagnosis not present

## 2020-08-17 DIAGNOSIS — M25512 Pain in left shoulder: Secondary | ICD-10-CM | POA: Insufficient documentation

## 2020-08-17 MED ORDER — LIDOCAINE 5 % EX PTCH: 1.0000 | MEDICATED_PATCH | CUTANEOUS | 0 refills | Status: DC

## 2020-08-17 MED ORDER — OXYCODONE-ACETAMINOPHEN 5-325 MG PO TABS
1.0000 | ORAL_TABLET | Freq: Once | ORAL | Status: AC
  Administered 2020-08-17: 1 via ORAL
  Filled 2020-08-17: qty 1

## 2020-08-17 MED ORDER — LIDOCAINE 5 % EX PTCH
1.0000 | MEDICATED_PATCH | CUTANEOUS | Status: DC
  Administered 2020-08-17: 1 via TRANSDERMAL
  Filled 2020-08-17: qty 1

## 2020-08-17 NOTE — ED Provider Notes (Signed)
[facility]-EMERGENCY DEPT Provider Note   CSN: [id number] Arrival date & time: 08/17/20  0815     History Chief Complaint  Patient presents with  . Neck Pain    Samuel Ewing is a 50 y.o. male for evaluation of continued left neck and shoulder pain.  Patient states pain has been present for a while.  He is being managed by orthopedics.  He recently had an MRI which confirmed pinched nerve.  He has an appointment with his orthopedic doctor tomorrow at 9 AM.  However he states he ran out of his pain medication a few days ago, and reports severe pain.  He is taking his home medications of meloxicam, baclofen, gabapentin, Cymbalta.  He denies new fall, trauma, or injury, though he is currently moving from one apartment to another.  As such, he may be doing increased activity.  He denies fevers or chills.  No new numbness or tingling.  Pain is consistent with his normal pain.  Additional history obtained from chart review.  Patient has been seen in the ER several times for similar pain.  I reviewed PMP database, patient was prescribed 30 Percocet 6 days ago by orthopedics.  HPI     Past Medical History:  Diagnosis Date  . Anxiety   . Hypertension   . Migraine     Patient Active Problem List   Diagnosis Date Noted  . Mild episode of recurrent major depressive disorder (HCC) 06/09/2020  . Essential hypertension 06/06/2020  . Hyperlipidemia 06/06/2020  . Migraine with aura and without status migrainosus, not intractable 06/06/2020  . Generalized anxiety disorder 06/06/2020  . Hypothyroidism 06/06/2020  . 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 Moderate episode of recurrent major depressive disorder (HCC) 06/06/2020    History reviewed. No pertinent surgical history.     Family History  Problem Relation Age of Onset  . Heart failure Mother   . Heart failure Father   . Heart attack Father     Social History   Tobacco Use  . Smoking status: Never Smoker   . Smokeless tobacco: Never Used  Vaping Use  . Vaping Use: Every day  . Substances: Nicotine  Substance Use Topics  . Alcohol use: Never  . Drug use: Yes    Types: Marijuana    Home Medications Prior to Admission medications   Medication Sig Start Date End Date Taking? Authorizing Provider  lidocaine (LIDODERM) 5 % Place 1 patch onto the skin daily. Remove & Discard patch within 12 hours or as directed by MD 08/17/20  Yes Sophiya Morello, PA-C  amLODipine (NORVASC) 10 MG tablet Take 1 tablet (10 mg total) by mouth daily. 06/06/20   Marcine Matar, MD  atorvastatin (LIPITOR) 20 MG tablet Take 1 tablet (20 mg total) by mouth daily. 06/06/20   Marcine Matar, MD  busPIRone (BUSPAR) 30 MG tablet Take 1 tablet (30 mg total) by mouth 2 (two) times daily. 06/09/20   Shanna Cisco, NP  cyclobenzaprine (FLEXERIL) 5 MG tablet Take 1 tablet (5 mg total) by mouth 3 (three) times daily as needed for muscle spasms. 07/16/20   Georgetta Haber, NP  DULoxetine (CYMBALTA) 60 MG capsule Take 1 capsule (60 mg total) by mouth daily. 06/09/20   Shanna Cisco, NP  hydrOXYzine (ATARAX/VISTARIL) 50 MG tablet Take 1 tablet (50 mg total) by mouth 3 (three) times daily as needed for anxiety. 06/09/20   Shanna Cisco, NP  levothyroxine (SYNTHROID) 50 MCG tablet Take  1 tablet (50 mcg total) by mouth daily. 06/06/20   Marcine Matar, MD  lisinopril (ZESTRIL) 20 MG tablet Take 1 tablet (20 mg total) by mouth daily. 06/06/20   Marcine Matar, MD  LORazepam (ATIVAN) 0.5 MG tablet TAKE 1 TABLET BY MOUTH ONCE DAILY AS NEEDED FOR ANXIETY 08/12/20   Toy Cookey E, NP  meloxicam (MOBIC) 15 MG tablet Take 1 tablet (15 mg total) by mouth daily. 07/19/20   Renne Crigler, PA-C  SUMAtriptan (IMITREX) 100 MG tablet Take 100 mg by mouth daily as needed for migraine. May repeat in 2 hours if headache persists or recurs.    [provider]  ziprasidone (GEODON) 40 MG capsule Take 1  capsule (40 mg total) by mouth 2 (two) times daily with a meal. 06/09/20   Shanna Cisco, NP    Allergies    Morphine and related  Review of Systems   Review of Systems  Musculoskeletal: Positive for arthralgias.  Neurological: Negative for numbness.    Physical Exam Updated Vital Signs BP (!) 161/95 (BP Location: Left Arm)   Pulse 84   Temp 98.8 F (37.1 C) (Oral)   Resp 16   SpO2 99%   Physical Exam Vitals and nursing note reviewed.  Constitutional:      General: He is not in acute distress.    Appearance: He is well-developed and well-nourished.  HENT:     Head: Normocephalic and atraumatic.  Eyes:     Extraocular Movements: EOM normal.  Pulmonary:     Effort: Pulmonary effort is normal.  Abdominal:     General: There is no distension.  Musculoskeletal:        General: Normal range of motion.     Cervical back: Normal range of motion.     Comments: No erythema, warmth, swelling, or obvious deformity.  Radial pulses 2+ bilaterally.  Grip strength equal bilaterally.  Mild tenderness palpation of diffuse left shoulder and trapezius muscle.  Skin:    General: Skin is warm.     Capillary Refill: Capillary refill takes less than 2 seconds.     Findings: No rash.  Neurological:     Mental Status: He is alert and oriented to person, place, and time.  Psychiatric:        Mood and Affect: Mood and affect normal.     ED Results / Procedures / Treatments   Labs (all labs ordered are listed, but only abnormal results are displayed) Labs Reviewed - No data to display  EKG None  Radiology No results found.  Procedures Procedures   Medications Ordered in ED Medications  lidocaine (LIDODERM) 5 % 1 patch (has no administration in time range)  oxyCODONE-acetaminophen (PERCOCET/ROXICET) 5-325 MG per tablet 1 tablet (has no administration in time range)    ED Course  I have reviewed the triage vital signs and the nursing notes.  Pertinent labs & imaging  results that were available during my care of the patient were reviewed by me and considered in my medical decision making (see chart for details).    MDM Rules/Calculators/A&P                          Patient presenting for evaluation of left shoulder and neck pain.  This is not new for him, however he reports he is out of his pain medication.  On exam, patient appears nontoxic.  Exam is not consistent with septic joint.  He is  neurovascularly intact.  Per patient, he has had an MRI recently with orthopedics, showed a pinched nerve.  I do not believe repeat imaging would be beneficial today.  I discussed with patient my concern about his pain medication use, as he should still have several days left the pain medication per PMPD database.  Discussed with patient nonopioid options of pain control including Lidoderm patch.  Will give single dose of medication in the ER, encourage close follow-up with orthopedics.  He already has an appointment scheduled for narcotic tomorrow morning.  At this time, patient appears safe for discharge.  Return precautions given.  Patient states he understands and agrees to plan  Final Clinical Impression(s) / ED Diagnoses Final diagnoses:  Chronic left shoulder pain    Rx / DC Orders ED Discharge Orders         Ordered    lidocaine (LIDODERM) 5 %  Every 24 hours        08/17/20 0936           Alveria Apley, PA-C 08/17/20 [id number]    Tegeler, Canary Brim, MD 08/17/20 [id number]

## 2020-08-17 NOTE — Discharge Instructions (Signed)
It is important that you are taking your medication as prescribed. Use Lidoderm patches to help with pain control. Continue taking other home medications as needed for pain. Follow-up with your orthopedic doctor at your scheduled appointment tomorrow. Return to the emergency room if you develop fevers, new numbness, inability move your hand, or any new, worsening, or concerning symptoms

## 2020-08-17 NOTE — ED Notes (Signed)
Pt in bed eating breakfast and watching TV. Denies any needs at this time.

## 2020-08-17 NOTE — ED Triage Notes (Signed)
Pt presents with c/o neck pain and shoulder pain. Pt reports he has been diagnosed with a pinched nerve and may need surgery but the pain is excruciating.

## 2020-09-03 ENCOUNTER — Encounter: Payer: Self-pay | Admitting: Internal Medicine

## 2020-09-03 NOTE — Progress Notes (Signed)
Patient is seeing Dr. Shon Baton at [facility] for cervical radiculopathy.  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  Plan is for surgery.

## 2020-09-08 ENCOUNTER — Encounter (HOSPITAL_COMMUNITY): Payer: Self-pay | Admitting: Psychiatry

## 2020-09-08 ENCOUNTER — Other Ambulatory Visit: Payer: Self-pay

## 2020-09-08 ENCOUNTER — Telehealth (INDEPENDENT_AMBULATORY_CARE_PROVIDER_SITE_OTHER): Payer: 59 | Admitting: Psychiatry

## 2020-09-08 DIAGNOSIS — F330 Major depressive disorder, recurrent, mild: Secondary | ICD-10-CM | POA: Diagnosis not present

## 2020-09-08 DIAGNOSIS — F411 Generalized anxiety disorder: Secondary | ICD-10-CM

## 2020-09-08 MED ORDER — BUSPIRONE HCL 30 MG PO TABS
30.0000 mg | ORAL_TABLET | Freq: Two times a day (BID) | ORAL | 2 refills | Status: DC
Start: 2020-09-08 — End: 2020-10-29

## 2020-09-08 MED ORDER — DULOXETINE HCL 60 MG PO CPEP: 120.0000 mg | ORAL_CAPSULE | Freq: Every day | ORAL | 2 refills | Status: DC

## 2020-09-08 MED ORDER — LORAZEPAM 0.5 MG PO TABS
0.5000 mg | ORAL_TABLET | Freq: Every day | ORAL | 0 refills | Status: DC | PRN
Start: 2020-09-08 — End: 2020-10-06

## 2020-09-08 MED ORDER — ZIPRASIDONE HCL 40 MG PO CAPS: 40.0000 mg | ORAL_CAPSULE | Freq: Two times a day (BID) | ORAL | 2 refills | Status: DC

## 2020-09-08 MED ORDER — HYDROXYZINE HCL 50 MG PO TABS
50.0000 mg | ORAL_TABLET | Freq: Three times a day (TID) | ORAL | 2 refills | Status: DC | PRN
Start: 2020-09-08 — End: 2020-11-14

## 2020-09-08 NOTE — Progress Notes (Signed)
[facility] MD/PA/NP OP Progress Note Virtual Visit via Video Note  I connected with Samuel Ewing on 09/08/20 at 11:00 AM EDT by a video enabled telemedicine application and verified that I am speaking with the correct person using two identifiers.  Location: Patient: Home Provider: Clinic   I discussed the limitations of evaluation and management by telemedicine and the availability of in person appointments. The patient expressed understanding and agreed to proceed.  I provided 30 minutes of non-face-to-face time during this encounter.    09/08/2020 11:29 AM Samuel Ewing  MRN:  [id number]  Chief Complaint: "Its hard to sleep because i'm in so much pain but my anxiety is better"  HPI: 50 year old male seen today for follow up psychiatric evaluation.  He has a psychiatric history of anxiety and depression. He is currently managed on BuSpar 30 mg twice daily, Cymbalta 60 mg daily, hydroxyzine 50 mg three times daily, Ativan 0.5 mg daily, and Geodon 40 twice daily (which he notes he takes for anxiety).  He notes his medications are somewhat effective in managing his psychiatric conditions  Today he is well-groomed, pleasant, cooperative, engaged in conversation, and maintained eye contact.  He informed provider that his anxiety and depression has somewhat improved since his last visit but notes it lingers because of his physical health. Patient informed provider that he has a slipped disk and a pinched nerve and notes that he will have surgery in April. He notes that at times he is in a lot of pain and has difficulty sleeping. Today provider conducted a GAD 7 and patient scored a 18, at his last visit he scored a 16. He notes that he constantly worries about his health. Provider also conducted a PHQ 9 and patient scored a 12, at his last visit he scored a 17.    Today he denies SI/HI/VAH or paranoia.  He endorses adequate appetite.  Today patient is agreeable to increase Cymbalta 60 mg to  120 mg to help manage anxiety and depression. Provider discussed reducing or discontinuing Ativan at next visit.  He will continue all other medications as prescribed. No other concerns noted at this time. Visit Diagnosis:    ICD-10-CM   1. Generalized anxiety disorder  F41.1 hydrOXYzine (ATARAX/VISTARIL) 50 MG tablet    DULoxetine (CYMBALTA) 60 MG capsule    busPIRone (BUSPAR) 30 MG tablet    ziprasidone (GEODON) 40 MG capsule    LORazepam (ATIVAN) 0.5 MG tablet  2. Mild episode of recurrent major depressive disorder (HCC)  F33.0 DULoxetine (CYMBALTA) 60 MG capsule    busPIRone (BUSPAR) 30 MG tablet    Past Psychiatric History: Anxiety and depression  Past Medical History:  Past Medical History:  Diagnosis Date  . Anxiety   . Hypertension   . Migraine    History reviewed. No pertinent surgical history.  Family Psychiatric History:  Paternal grandmother depression  Family History:  Family History  Problem Relation Age of Onset  . Heart failure Mother   . Heart failure Father   . Heart attack Father     Social History:  Social History   Socioeconomic History  . Marital status: Single    Spouse name: Not on file  . Number of children: Not on file  . Years of education: Not on file  . Highest education level: Not on file  Occupational History  . Not on file  Tobacco Use  . Smoking status: Never Smoker  . Smokeless tobacco: Never Used  Vaping Use  .  Vaping Use: Every day  . Substances: Nicotine  Substance and Sexual Activity  . Alcohol use: Never  . Drug use: Yes    Types: Marijuana  . Sexual activity: Not on file  Other Topics Concern  . Not on file  Social History Narrative  . Not on file   Social Determinants of Health   Financial Resource Strain: Not on file  Food Insecurity: Not on file  Transportation Needs: Not on file  Physical Activity: Not on file  Stress: Not on file  Social Connections: Not on file    Allergies:  Allergies  Allergen  Reactions  . Morphine And Related     Metabolic Disorder Labs: No results found for: HGBA1C, MPG No results found for: PROLACTIN Lab Results  Component Value Date   CHOL 120 06/18/2020   TRIG 101 06/18/2020   HDL 42 06/18/2020   CHOLHDL 2.9 06/18/2020   LDLCALC 59 06/18/2020   Lab Results  Component Value Date   TSH 1.770 06/18/2020    Therapeutic Level Labs: No results found for: LITHIUM No results found for: VALPROATE No components found for:  CBMZ  Current Medications: Current Outpatient Medications  Medication Sig Dispense Refill  . amLODipine (NORVASC) 10 MG tablet Take 1 tablet (10 mg total) by mouth daily. 30 tablet 3  . atorvastatin (LIPITOR) 20 MG tablet Take 1 tablet (20 mg total) by mouth daily. 30 tablet 3  . busPIRone (BUSPAR) 30 MG tablet Take 1 tablet (30 mg total) by mouth 2 (two) times daily. 60 tablet 2  . cyclobenzaprine (FLEXERIL) 5 MG tablet Take 1 tablet (5 mg total) by mouth 3 (three) times daily as needed for muscle spasms. 15 tablet 0  . DULoxetine (CYMBALTA) 60 MG capsule Take 2 capsules (120 mg total) by mouth daily. 60 capsule 2  . hydrOXYzine (ATARAX/VISTARIL) 50 MG tablet Take 1 tablet (50 mg total) by mouth 3 (three) times daily as needed for anxiety. 90 tablet 2  . levothyroxine (SYNTHROID) 50 MCG tablet Take 1 tablet (50 mcg total) by mouth daily. 30 tablet 3  . lidocaine (LIDODERM) 5 % Place 1 patch onto the skin daily. Remove & Discard patch within 12 hours or as directed by MD 30 patch 0  . lisinopril (ZESTRIL) 20 MG tablet Take 1 tablet (20 mg total) by mouth daily. 30 tablet 3  . LORazepam (ATIVAN) 0.5 MG tablet Take 1 tablet (0.5 mg total) by mouth daily as needed for anxiety. 30 tablet 0  . meloxicam (MOBIC) 15 MG tablet Take 1 tablet (15 mg total) by mouth daily. 10 tablet 0  . SUMAtriptan (IMITREX) 100 MG tablet Take 100 mg by mouth daily as needed for migraine. May repeat in 2 hours if headache persists or recurs.    . ziprasidone  (GEODON) 40 MG capsule Take 1 capsule (40 mg total) by mouth 2 (two) times daily with a meal. 60 capsule 2   No current facility-administered medications for this visit.     Musculoskeletal: Strength & Muscle Tone: Unable to assess due to telehealth visit Gait & Station: Unable to assess due to telehealth visit Patient leans: N/A  Psychiatric Specialty Exam: Review of Systems  There were no vitals taken for this visit.There is no height or weight on file to calculate BMI.  General Appearance: Well Groomed  Eye Contact:  Good  Speech:  Clear and Coherent and Normal Rate  Volume:  Normal  Mood:  Anxious and Depressed  Affect:  Appropriate and Congruent  Thought Process:  Coherent, Goal Directed and Linear  Orientation:  Full (Time, Place, and Person)  Thought Content: WDL and Logical   Suicidal Thoughts:  No  Homicidal Thoughts:  No  Memory:  Immediate;   Good Recent;   Good Remote;   Good  Judgement:  Good  Insight:  Good  Psychomotor Activity:  Normal  Concentration:  Concentration: Good and Attention Span: Good  Recall:  Good  Fund of Knowledge: Good  Language: Good  Akathisia:  No  Handed:  Right  AIMS (if indicated): Right  Assets:  Communication Skills Desire for Improvement Financial Resources/Insurance Housing Social Support  ADL's:  Intact  Cognition: WNL  Sleep:  Fair   Screenings: GAD-7   Flowsheet Row Video Visit from 09/08/2020 in [facility] Office Visit from 06/09/2020 in [facility] Telemedicine from 06/06/2020 in [facility]  Total GAD-7 Score 18 16 17     PHQ2-9   Flowsheet Row Video Visit from 09/08/2020 in [facility] Office Visit from 06/09/2020 in [facility] Telemedicine from 06/06/2020 in [facility]  PHQ-2 Total Score 2 6 3   PHQ-9 Total Score 12 17 15     Flowsheet Row  Video Visit from 09/08/2020 in [facility] ED from 08/17/2020 in [facility] ED from 08/12/2020 in [facility]-EMERGENCY DEPT  C-SSRS RISK CATEGORY No Risk No Risk No Risk       Assessment and Plan: Patient endorses symptoms of anxiety and depression due to his physical health. Today he is agreeable to increasing Cymbalta 60 mg to 120 mg to help manage anxiety, depression, and pain. Provider discussed reducing or discontinuing Ativan at next visit.   1. Generalized anxiety disorder  Continue- hydrOXYzine (ATARAX/VISTARIL) 50 MG tablet; Take 1 tablet (50 mg total) by mouth 3 (three) times daily as needed for anxiety.  Dispense: 90 tablet; Refill: 2 Increased- DULoxetine (CYMBALTA) 60 MG capsule; Take 2 capsules (120 mg total) by mouth daily.  Dispense: 60 capsule; Refill: 2 Continue- busPIRone (BUSPAR) 30 MG tablet; Take 1 tablet (30 mg total) by mouth 2 (two) times daily.  Dispense: 60 tablet; Refill: 2 Continue- ziprasidone (GEODON) 40 MG capsule; Take 1 capsule (40 mg total) by mouth 2 (two) times daily with a meal.  Dispense: 60 capsule; Refill: 2 Continue- LORazepam (ATIVAN) 0.5 MG tablet; Take 1 tablet (0.5 mg total) by mouth daily as needed for anxiety.  Dispense: 30 tablet; Refill: 0  2. Mild episode of recurrent major depressive disorder (HCC)  Increased- DULoxetine (CYMBALTA) 60 MG capsule; Take 2 capsules (120 mg total) by mouth daily.  Dispense: 60 capsule; Refill: 2 Continue- busPIRone (BUSPAR) 30 MG tablet; Take 1 tablet (30 mg total) by mouth 2 (two) times daily.  Dispense: 60 tablet; Refill: 2  Follow up in 3 months   08/19/2020, NP 09/08/2020, 11:29 AM

## 2020-09-10 ENCOUNTER — Emergency Department (HOSPITAL_COMMUNITY)
Admission: EM | Admit: 2020-09-10 | Discharge: 2020-09-10 | Disposition: A | Discharge status: Home / Self Care | Payer: 59 | Attending: Emergency Medicine | Admitting: Emergency Medicine

## 2020-09-10 ENCOUNTER — Encounter (HOSPITAL_COMMUNITY): Payer: Self-pay | Admitting: Emergency Medicine

## 2020-09-10 ENCOUNTER — Other Ambulatory Visit: Payer: Self-pay

## 2020-09-10 ENCOUNTER — Encounter: Payer: Self-pay | Admitting: Family

## 2020-09-10 ENCOUNTER — Ambulatory Visit (INDEPENDENT_AMBULATORY_CARE_PROVIDER_SITE_OTHER): Payer: 59 | Admitting: Family

## 2020-09-10 VITALS — BP 117/80 | HR 90 | Wt 194.2 lb

## 2020-09-10 DIAGNOSIS — M542 Cervicalgia: Secondary | ICD-10-CM | POA: Diagnosis present

## 2020-09-10 DIAGNOSIS — M5412 Radiculopathy, cervical region: Secondary | ICD-10-CM

## 2020-09-10 DIAGNOSIS — M25512 Pain in left shoulder: Secondary | ICD-10-CM | POA: Diagnosis not present

## 2020-09-10 DIAGNOSIS — R000 Tachycardia, unspecified: Secondary | ICD-10-CM | POA: Insufficient documentation

## 2020-09-10 DIAGNOSIS — R531 Weakness: Secondary | ICD-10-CM | POA: Diagnosis not present

## 2020-09-10 DIAGNOSIS — I10 Essential (primary) hypertension: Secondary | ICD-10-CM | POA: Insufficient documentation

## 2020-09-10 DIAGNOSIS — Z79899 Other long term (current) drug therapy: Secondary | ICD-10-CM | POA: Diagnosis not present

## 2020-09-10 MED ORDER — OXYCODONE-ACETAMINOPHEN 5-325 MG PO TABS
1.0000 | ORAL_TABLET | Freq: Once | ORAL | Status: AC
  Administered 2020-09-10: 1 via ORAL
  Filled 2020-09-10: qty 1

## 2020-09-10 MED ORDER — LIDOCAINE 5 % EX PTCH
1.0000 | MEDICATED_PATCH | CUTANEOUS | Status: DC
  Administered 2020-09-10: 1 via TRANSDERMAL
  Filled 2020-09-10: qty 1

## 2020-09-10 MED ORDER — LORAZEPAM 0.5 MG PO TABS
0.5000 mg | ORAL_TABLET | Freq: Once | ORAL | Status: AC
  Administered 2020-09-10: 0.5 mg via ORAL
  Filled 2020-09-10: qty 1

## 2020-09-10 MED ORDER — KETOROLAC TROMETHAMINE 60 MG/2ML IM SOLN
60.0000 mg | Freq: Once | INTRAMUSCULAR | Status: DC
  Filled 2020-09-10: qty 2

## 2020-09-10 NOTE — ED Provider Notes (Signed)
[facility]-EMERGENCY DEPT Provider Note   CSN: [id number] Arrival date & time: 09/10/20  0554     History Chief Complaint  Patient presents with  . Anxiety  . Neck Pain    Samuel Ewing is a 50 y.o. male with a history of anxiety, hypertension, migraine.  Patient presents with a chief complaint of cervical neck pain with radiation to left arm.  Pain has been present since January 2022.  Patient reports that his pain does not feel different from previous reports it is more painful than usual.    Patient reports that he has a pinched nerve and is being followed by Dr. Ethelene [facility].  Patient reports that he last saw Dr. Ethelene Hal last week.  Patient has had recent MRI and is scheduled for surgery on April 1.  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  Patient reports that his pain is causing increased anxiety.  Chart review patient was seen by psych provider NP Doyne Keel 14th and had his Cymbalta dose increased.  Patient reports that he he took Ativan 0.5 mg at 4 AM and home dose of Percocet at 1 AM.  PDMP was reviewed which showed filled prescription for oxycodone -acetaminophen 10mg -325mg  30pills on 3/15.    Patient endorses pain radiating from the neck into left arm with associated sensation in fingers, as well as weakness to left arm.   Patient denies any fevers, chills, stiff neck, swelling or redness to his cervical neck or shoulder, saddle anesthesia, bowel or bladder dysfunction, numbness or tingling to her extremities, weakness to lower extremities  Denies any suicidal ideations, homicidal ideations, auditory hallucinations, visual hallucinations  HPI     Past Medical History:  Diagnosis Date  . Anxiety   . Hypertension   . Migraine     Patient Active Problem List   Diagnosis Date Noted  . Mild episode of recurrent major depressive  disorder (HCC) 06/09/2020  . Essential hypertension 06/06/2020  . Hyperlipidemia 06/06/2020  . Migraine with aura and without status migrainosus, not intractable 06/06/2020  . Generalized anxiety disorder 06/06/2020  . Hypothyroidism 06/06/2020  . 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 Moderate episode of recurrent major depressive disorder (HCC) 06/06/2020    History reviewed. No pertinent surgical history.     Family History  Problem Relation Age of Onset  . Heart failure Mother   . Heart failure Father   . Heart attack Father     Social History   Tobacco Use  . Smoking status: Never Smoker  . Smokeless tobacco: Never Used  Vaping Use  . Vaping Use: Every day  . Substances: Nicotine  Substance Use Topics  . Alcohol use: Never  . Drug use: Yes    Types: Marijuana    Home Medications Prior to Admission medications   Medication Sig Start Date End Date Taking? Authorizing Provider  amLODipine (NORVASC) 10 MG tablet Take 1 tablet (10 mg total) by mouth daily. 06/06/20   14/03/2020, MD  atorvastatin (LIPITOR) 20 MG tablet Take 1 tablet (20 mg total) by mouth daily. 06/06/20   Marcine Matar, MD  busPIRone (BUSPAR) 30 MG tablet Take 1 tablet (30 mg total) by mouth 2 (two) times daily. 09/08/20   Marcine Matar, NP  cyclobenzaprine (FLEXERIL) 5 MG tablet Take 1 tablet (5 mg total) by mouth 3 (three) times daily as needed for  muscle spasms. 07/16/20   Georgetta HaberBurky, Natalie B, NP  DULoxetine (CYMBALTA) 60 MG capsule Take 2 capsules (120 mg total) by mouth daily. 09/08/20   Shanna CiscoParsons, Brittney E, NP  hydrOXYzine (ATARAX/VISTARIL) 50 MG tablet Take 1 tablet (50 mg total) by mouth 3 (three) times daily as needed for anxiety. 09/08/20   Shanna CiscoParsons, Brittney E, NP  levothyroxine (SYNTHROID) 50 MCG tablet Take 1 tablet (50 mcg total) by mouth daily. 06/06/20   Marcine MatarJohnson, Deborah B, MD  lidocaine (LIDODERM) 5 % Place 1 patch onto the skin daily. Remove & Discard patch within 12 hours or as  directed by MD 08/17/20   Caccavale, Sophia, PA-C  lisinopril (ZESTRIL) 20 MG tablet Take 1 tablet (20 mg total) by mouth daily. 06/06/20   Marcine MatarJohnson, Deborah B, MD  LORazepam (ATIVAN) 0.5 MG tablet Take 1 tablet (0.5 mg total) by mouth daily as needed for anxiety. 09/08/20   Shanna CiscoParsons, Brittney E, NP  meloxicam (MOBIC) 15 MG tablet Take 1 tablet (15 mg total) by mouth daily. 07/19/20   Renne CriglerGeiple, Joshua, PA-C  SUMAtriptan (IMITREX) 100 MG tablet Take 100 mg by mouth daily as needed for migraine. May repeat in 2 hours if headache persists or recurs.    [provider]  ziprasidone (GEODON) 40 MG capsule Take 1 capsule (40 mg total) by mouth 2 (two) times daily with a meal. 09/08/20   Shanna CiscoParsons, Brittney E, NP    Allergies    Morphine and related  Review of Systems   Review of Systems  Constitutional: Negative for chills and fever.  Eyes: Negative for visual disturbance.  Respiratory: Negative for shortness of breath.   Cardiovascular: Negative for chest pain.  Gastrointestinal: Negative for abdominal pain, nausea and vomiting.  Genitourinary: Negative for difficulty urinating and dysuria.  Musculoskeletal: Positive for neck pain. Negative for back pain, joint swelling and neck stiffness.  Skin: Negative for color change, rash and wound.  Neurological: Positive for weakness. Negative for dizziness, tremors, seizures, syncope, facial asymmetry, speech difficulty, light-headedness, numbness and headaches.  Psychiatric/Behavioral: Negative for confusion.    Physical Exam Updated Vital Signs BP (!) 154/114 (BP Location: Left Arm)   Pulse (!) 110   Temp 98.2 F (36.8 C) (Oral)   Resp 18   Ht 5\' 10"  (1.778 m)   Wt 90.7 kg   SpO2 95%   BMI 28.70 kg/m   Physical Exam Vitals and nursing note reviewed.  Constitutional:      General: He is not in acute distress.    Appearance: He is not ill-appearing, toxic-appearing or diaphoretic.     Comments: uncomfortable due to complaints of pain   HENT:     Head: Normocephalic.  Eyes:     General: No scleral icterus.       Right eye: No discharge.        Left eye: No discharge.  Cardiovascular:     Rate and Rhythm: Tachycardia present.     Comments: Tachycardia at 110 Pulmonary:     Effort: Pulmonary effort is normal. No respiratory distress.     Breath sounds: No stridor.     Comments: Speaking full complete sentences without difficulty Musculoskeletal:     Right shoulder: No swelling, deformity, effusion, laceration, tenderness, bony tenderness or crepitus. Normal range of motion. Normal strength.     Left shoulder: Tenderness present. No swelling, deformity, effusion, laceration, bony tenderness or crepitus. Decreased range of motion. Normal strength.     Right upper arm: Normal.  Left upper arm: Normal.     Right elbow: Normal.     Left elbow: Normal.     Right forearm: Normal.     Left forearm: Normal.     Right wrist: Normal.     Left wrist: Normal.     Right hand: No swelling, deformity, lacerations, tenderness or bony tenderness. Normal range of motion. Normal strength. Normal sensation. Normal capillary refill.     Left hand: No swelling, deformity, lacerations, tenderness or bony tenderness. Normal range of motion. Normal strength. Normal sensation. Normal capillary refill.     Cervical back: Normal range of motion and neck supple. Tenderness present. No edema, erythema, signs of trauma, rigidity, torticollis or crepitus. Pain with movement and muscular tenderness present. No spinous process tenderness. Normal range of motion.     Comments: Tenderness to left trapezius No deformity, step-off, midline tenderness to cervical, thoracic, or lumbar spine  No swelling, erythema, or wound noted to cervical, thoracic, or lumbar back  No swelling, erythema or wound noted to left shoulder or left upper extremity  Grip strength equal, +5 strength to bilateral upper extremities  Decreased flexion and abduction to left  shoulder due to complaints of pain from patient    Skin:    General: Skin is warm and dry.  Neurological:     General: No focal deficit present.     Mental Status: He is alert.     GCS: GCS eye subscore is 4. GCS verbal subscore is 5. GCS motor subscore is 6.     Comments: Patient able to stand and ambulate without difficulty  Psychiatric:        Mood and Affect: Mood is anxious.        Behavior: Behavior is cooperative.     ED Results / Procedures / Treatments   Labs (all labs ordered are listed, but only abnormal results are displayed) Labs Reviewed - No data to display  EKG None  Radiology No results found.  Procedures Procedures   Medications Ordered in ED Medications  lidocaine (LIDODERM) 5 % 1 patch (1 patch Transdermal Patch Applied 09/10/20 0657)  oxyCODONE-acetaminophen (PERCOCET/ROXICET) 5-325 MG per tablet 1 tablet (1 tablet Oral Given 09/10/20 0657)  LORazepam (ATIVAN) tablet 0.5 mg (0.5 mg Oral Given 09/10/20 2956)    ED Course  I have reviewed the triage vital signs and the nursing notes.  Pertinent labs & imaging results that were available during my care of the patient were reviewed by me and considered in my medical decision making (see chart for details).    MDM Rules/Calculators/A&P                          Alert 50 year old male in no acute distress, nontoxic-appearing.  Appears anxious and uncomfortable due to complaints of his pain.  Patient is noted to be standing and ambulating around the room without any difficulty.  Per chart review patient has been seen multiple times in emergency department for complaint of left cervical neck radiation of pain into his left arm.  Patient is seen by Dr. [facility]lene [facility] for pinched nerve.  Patient reports that he is scheduled for surgery on April 1.  He reports seeing Dr. Ethelene Hal last week.  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  She denies any recent  falls or injuries.  Patient ports pain is similar to what is described in the past however  worse today.  Reports that his pain is causing worsening anxiety.  Low suspicion for septic arthritis as patient has no fevers or chills, no joint swelling, erythema.   Low suspicion for meningitis as patient has no neck stiffness, afebrile, no confusion.    Patient reports taking 0.5 mg Ativan at 0400.  Patient reports taking prescribed opiate pain medication at 0100.  Will give patient Percocet, Ativan and lidocaine patch.  Patient will need to follow-up with his orthopedist for further management of his chronic pain issues.  Patient was advised that medication he received on the emergency department can cause sedation and therefore he should refrain from driving or operating heavy machinery for the next 24 hours.  Discussed nonopiate pain medication, ice, heat, Voltaren gel and lidocaine patches to help with this pain at home on top of his normal medication.  Patient given strict return precautions.  Patient is amenable with this plan.  Final Clinical Impression(s) / ED Diagnoses Final diagnoses:  Cervical radiculopathy    Rx / DC Orders ED Discharge Orders    None       Haskel Schroeder, PA-C 09/10/20 1833    Jacalyn Lefevre, MD 09/11/20 [phone]

## 2020-09-10 NOTE — Discharge Instructions (Addendum)
You came to the emergency department today to be evaluated for your neck pain with radiation to your left arm.  This pain is likely due to pinched nerve in a chronic issue.  You were given Percocet and Ativan in the emergency [facility] today to help with your symptoms as well as a lidocaine patch.  Please use your prescribed pain medication at home as well as Tylenol, lidocaine patches, over-the-counter Voltaren gel, heat and/or ice.  Please do not exceed 3000 mg of Tylenol in 1 day.  Please follow-up with your orthopedic physician Dr. Ethelene Hal further management of your pain.  Please follow-up with your psychiatrist for further management of your anxiety.  Today you received medications that may make you sleepy or impair your ability to make decisions.  For the next 24 hours please do not drive, operate heavy machinery, care for a small child with out another adult present, or perform any activities that may cause harm to you or someone else if you were to fall asleep or be impaired.    Get help right away if: You have weakness or numbness in your hand, arm, face, or leg. You have a high fever. You have a stiff, rigid neck. You lose control of your bowels or your bladder (have incontinence). You have trouble with walking, balance, or speaking.

## 2020-09-10 NOTE — Progress Notes (Signed)
Patient ID: FLEMING PRILL, male    DOB: 07/08/1970  MRN: [id number]  CC: Preoperative Clearance  Subjective: Samuel Ewing is a 50 y.o. male who presents for preoperative clearance. His concerns today include:   Patient scheduled for cervical radiculopathy surgery on 09/26/2020. Reports today for surgical clearance. States he was given a questionnaire that he thinks the provider is supposed to complete. He cannot recall what the questions asked and does not have the questions present with him during today's visit.   Today CMA called [facility] to confirm which documents the patient needs cleared for surgery. It was confirmed that the patient is already cleared and scheduled for surgery on 09/26/2020 with Venita Lick, MD. Their office endorsed from an Orthopedics standpoint that no additional documents are required at this time including but not limited to labs, testing, and provider signature.   Patient Active Problem List   Diagnosis Date Noted   Mild episode of recurrent major depressive disorder (HCC) 06/09/2020   Essential hypertension 06/06/2020   Hyperlipidemia 06/06/2020   Migraine with aura and without status migrainosus, not intractable 06/06/2020   Generalized anxiety disorder 06/06/2020   Hypothyroidism 06/06/2020   Low testosterone 06/06/2020   Moderate episode of recurrent major depressive disorder (HCC) 06/06/2020     Current Outpatient Medications on File Prior to Visit  Medication Sig Dispense Refill   amLODipine (NORVASC) 10 MG tablet Take 1 tablet (10 mg total) by mouth daily. 30 tablet 3   atorvastatin (LIPITOR) 20 MG tablet Take 1 tablet (20 mg total) by mouth daily. 30 tablet 3   busPIRone (BUSPAR) 30 MG tablet Take 1 tablet (30 mg total) by mouth 2 (two) times daily. 60 tablet 2   cyclobenzaprine (FLEXERIL) 5 MG tablet Take 1 tablet (5 mg total) by mouth 3 (three) times daily as needed for muscle spasms. 15 tablet 0   DULoxetine  (CYMBALTA) 60 MG capsule Take 2 capsules (120 mg total) by mouth daily. 60 capsule 2   hydrOXYzine (ATARAX/VISTARIL) 50 MG tablet Take 1 tablet (50 mg total) by mouth 3 (three) times daily as needed for anxiety. 90 tablet 2   levothyroxine (SYNTHROID) 50 MCG tablet Take 1 tablet (50 mcg total) by mouth daily. 30 tablet 3   lidocaine (LIDODERM) 5 % Place 1 patch onto the skin daily. Remove & Discard patch within 12 hours or as directed by MD 30 patch 0   lisinopril (ZESTRIL) 20 MG tablet Take 1 tablet (20 mg total) by mouth daily. 30 tablet 3   LORazepam (ATIVAN) 0.5 MG tablet Take 1 tablet (0.5 mg total) by mouth daily as needed for anxiety. 30 tablet 0   meloxicam (MOBIC) 15 MG tablet Take 1 tablet (15 mg total) by mouth daily. 10 tablet 0   SUMAtriptan (IMITREX) 100 MG tablet Take 100 mg by mouth daily as needed for migraine. May repeat in 2 hours if headache persists or recurs.     ziprasidone (GEODON) 40 MG capsule Take 1 capsule (40 mg total) by mouth 2 (two) times daily with a meal. 60 capsule 2   No current facility-administered medications on file prior to visit.    Allergies  Allergen Reactions   Morphine And Related     Social History   Socioeconomic History   Marital status: Single    Spouse name: Not on file   Number of children: Not on file   Years of education: Not on file   Highest education level: Not on file  Occupational History   Not on file  Tobacco Use   Smoking status: Never Smoker   Smokeless tobacco: Never Used  Vaping Use   Vaping Use: Every day   Substances: Nicotine  Substance and Sexual Activity   Alcohol use: Never   Drug use: Yes    Types: Marijuana   Sexual activity: Not on file  Other Topics Concern   Not on file  Social History Narrative   Not on file   Social Determinants of Health   Financial Resource Strain: Not on file  Food Insecurity: Not on file  Transportation Needs: Not on file  Physical Activity: Not on  file  Stress: Not on file  Social Connections: Not on file  Intimate Partner Violence: Not on file    Family History  Problem Relation Age of Onset   Heart failure Mother    Heart failure Father    Heart attack Father     History reviewed. No pertinent surgical history.  ROS: Review of Systems Negative except as stated above  PHYSICAL EXAM: BP 117/80 (BP Location: Left Arm, Patient Position: Sitting)    Pulse 90    Wt 194 lb 3.2 oz (88.1 kg)    SpO2 98%    BMI 27.86 kg/m   Physical Exam HENT:     Head: Normocephalic and atraumatic.  Eyes:     Extraocular Movements: Extraocular movements intact.     Conjunctiva/sclera: Conjunctivae normal.     Pupils: Pupils are equal, round, and reactive to light.  Neck:     Comments: No pain or tenderness present on palpation. No evidence of erythema or edema. Skin intact. Does have discomfort of bilateral neck with turning and bending. Lidocaine patch present on posterior neck. Neurological:     Mental Status: He is alert.    ASSESSMENT AND PLAN: 1. Cervical radiculopathy: - Patient scheduled for cervical radiculopathy surgery on 09/26/2020. Reports today for surgical clearance. States he was given a questionnaire that he thinks the provider is supposed to complete. He cannot recall what the questions asked and does not have the questions present with him during today's visit.  - Today CMA called [facility] to confirm which documents the patient needs cleared for surgery. It was confirmed that the patient is already cleared and scheduled for surgery on 09/26/2020 with Venita Lick, MD. Their office endorsed from an Orthopedics standpoint that no additional documents are required at this time including but not limited to labs, testing, and provider signature. - Keep appointment with Orthopedics. - Follow-up with primary provider as scheduled.     Patient was given the opportunity to ask questions.  Patient verbalized understanding  of the plan and was able to repeat key elements of the plan. Patient was given clear instructions to go to Emergency Department or return to medical center if symptoms don't improve, worsen, or new problems develop.The patient verbalized understanding.    Requested Prescriptions    No prescriptions requested or ordered in this encounter    Follow-up with primary provider as scheduled.   Rema Fendt, NP

## 2020-09-10 NOTE — Patient Instructions (Signed)
Keep appointment scheduled with [facility].  Follow-up with primary provider as scheduled.  Cervical Radiculopathy  Cervical radiculopathy means that a nerve in the neck (a cervical nerve) is pinched or bruised. This can happen because of an injury to the cervical spine (vertebrae) in the neck, or as a normal part of getting older. This can cause pain or loss of feeling (numbness) that runs from your neck all the way down to your arm and fingers. Often, this condition gets better with rest. Treatment may be needed if the condition does not get better. What are the causes?  A neck injury.  A bulging disk in your spine.  Muscle movements that you cannot control (muscle spasms).  Tight muscles in your neck due to overuse.  Arthritis.  Breakdown in the bones and joints of the spine (spondylosis) due to getting older.  Bone spurs that form near the nerves in the neck. What are the signs or symptoms?  Pain. The pain may: ? Run from the neck to the arm and hand. ? Be very bad or irritating. ? Be worse when you move your neck.  Loss of feeling or tingling in your arm or hand.  Weakness in your arm or hand, in very bad cases. How is this treated? In many cases, treatment is not needed for this condition. With rest, the condition often gets better over time. If treatment is needed, options may include:  Wearing a soft neck collar (cervical collar) for short periods of time, as told by your doctor.  Doing exercises (physical therapy) to strengthen your neck muscles.  Taking medicines.  Having shots (injections) in your spine, in very bad cases.  Having surgery. This may be needed if other treatments do not help. The type of surgery that is used depends on the cause of your condition. Follow these instructions at home: If you have a soft neck collar:  Wear it as told by your doctor. Remove it only as told by your doctor.  Ask your doctor if you can remove the collar for  cleaning and bathing. If you are allowed to remove the collar for cleaning or bathing: ? Follow instructions from your doctor about how to remove the collar safely. ? Clean the collar by wiping it with mild soap and water and drying it completely. ? Take out any removable pads in the collar every 1-2 days. Wash them by hand with soap and water. Let them air-dry completely before you put them back in the collar. ? Check your skin under the collar for redness or sores. If you see any, tell your doctor. Managing pain  Take over-the-counter and prescription medicines only as told by your doctor.  If told, put ice on the painful area. ? If you have a soft neck collar, remove it as told by your doctor. ? Put ice in a plastic bag. ? Place a towel between your skin and the bag. ? Leave the ice on for 20 minutes, 2-3 times a day.  If using ice does not help, you can try using heat. Use the heat source that your doctor recommends, such as a moist heat pack or a heating pad. ? Place a towel between your skin and the heat source. ? Leave the heat on for 20-30 minutes. ? Remove the heat if your skin turns bright red. This is very important if you are unable to feel pain, heat, or cold. You may have a greater risk of getting burned.  You  may try a gentle neck and shoulder rub (massage).      Activity  Rest as needed.  Return to your normal activities as told by your doctor. Ask your doctor what activities are safe for you.  Do exercises as told by your doctor or physical therapist.  Do not lift anything that is heavier than 10 lb (4.5 kg) until your doctor tells you that it is safe. General instructions  Use a flat pillow when you sleep.  Do not drive while wearing a soft neck collar. If you do not have a soft neck collar, ask your doctor if it is safe to drive while your neck heals.  Ask your doctor if the medicine prescribed to you requires you to avoid driving or using heavy  machinery.  Do not use any products that contain nicotine or tobacco, such as cigarettes, e-cigarettes, and chewing tobacco. These can delay healing. If you need help quitting, ask your doctor.  Keep all follow-up visits as told by your doctor. This is important. Contact a doctor if:  Your condition does not get better with treatment. Get help right away if:  Your pain gets worse and is not helped with medicine.  You lose feeling or feel weak in your hand, arm, face, or leg.  You have a high fever.  You have a stiff neck.  You cannot control when you poop or pee (have incontinence).  You have trouble with walking, balance, or talking. Summary  Cervical radiculopathy means that a nerve in the neck is pinched or bruised.  A nerve can get pinched from a bulging disk, arthritis, an injury to the neck, or other causes.  Symptoms include pain, tingling, or loss of feeling that goes from the neck into the arm or hand.  Weakness in your arm or hand can happen in very bad cases.  Treatment may include resting, wearing a soft neck collar, and doing exercises. You might need to take medicines for pain. In very bad cases, shots or surgery may be needed. This information is not intended to replace advice given to you by your health care provider. Make sure you discuss any questions you have with your health care provider. Document Revised: 05/05/2018 Document Reviewed: 05/05/2018 Elsevier Patient Education  2021 [employer].

## 2020-09-10 NOTE — ED Triage Notes (Signed)
Patient is complaining of neck pain and left shoulder pain. Patient states is having a real bad anxiety attack. Patient states he has not slept in two days. He also says he is severely dehydrated.

## 2020-09-16 ENCOUNTER — Telehealth: Payer: Self-pay | Admitting: Family

## 2020-09-16 NOTE — Telephone Encounter (Signed)
Nurse Roanna Raider , from [facility] ortho called and states she is faxing surgical clearence form For pcp to fill out and fax back.   [phone]

## 2020-09-16 NOTE — Telephone Encounter (Signed)
Pt form has been faxed to [facility]-for Dr. Laural Benes

## 2020-09-26 ENCOUNTER — Emergency Department (HOSPITAL_COMMUNITY)
Admission: EM | Admit: 2020-09-26 | Discharge: 2020-09-27 | Disposition: A | Discharge status: Home / Self Care | Payer: 59 | Attending: Emergency Medicine | Admitting: Emergency Medicine

## 2020-09-26 ENCOUNTER — Ambulatory Visit: Payer: 59 | Admitting: Internal Medicine

## 2020-09-26 ENCOUNTER — Encounter (HOSPITAL_COMMUNITY): Payer: Self-pay

## 2020-09-26 ENCOUNTER — Other Ambulatory Visit: Payer: Self-pay

## 2020-09-26 DIAGNOSIS — E039 Hypothyroidism, unspecified: Secondary | ICD-10-CM | POA: Diagnosis not present

## 2020-09-26 DIAGNOSIS — I10 Essential (primary) hypertension: Secondary | ICD-10-CM | POA: Diagnosis not present

## 2020-09-26 DIAGNOSIS — F419 Anxiety disorder, unspecified: Secondary | ICD-10-CM | POA: Diagnosis not present

## 2020-09-26 DIAGNOSIS — Z79899 Other long term (current) drug therapy: Secondary | ICD-10-CM | POA: Insufficient documentation

## 2020-09-26 DIAGNOSIS — M542 Cervicalgia: Secondary | ICD-10-CM

## 2020-09-26 DIAGNOSIS — G894 Chronic pain syndrome: Secondary | ICD-10-CM

## 2020-09-26 LAB — BASIC METABOLIC PANEL
Anion gap: 11 (ref 5–15)
BUN: 11 mg/dL (ref 6–20)
CO2: 22 mmol/L (ref 22–32)
Calcium: 9.2 mg/dL (ref 8.9–10.3)
Chloride: 103 mmol/L (ref 98–111)
Creatinine, Ser: 0.93 mg/dL (ref 0.61–1.24)
GFR, Estimated: >60 mL/min (ref >60)
Glucose, Bld: 194 mg/dL — ABNORMAL HIGH (ref 70–99)
Potassium: 4.6 mmol/L (ref 3.5–5.1)
Sodium: 136 mmol/L (ref 135–145)

## 2020-09-26 LAB — CBC WITH DIFFERENTIAL/PLATELET
Abs Immature Granulocytes: 0.11 10*3/uL — ABNORMAL HIGH (ref 0.00–0.07)
Basophils Absolute: 0 10*3/uL (ref 0.0–0.1)
Basophils Relative: 0 %
Eosinophils Absolute: 0 10*3/uL (ref 0.0–0.5)
Eosinophils Relative: 0 %
HCT: 37.1 % — ABNORMAL LOW (ref 39.0–52.0)
Hemoglobin: 12.6 g/dL — ABNORMAL LOW (ref 13.0–17.0)
Immature Granulocytes: 1 %
Lymphocytes Relative: 6 %
Lymphs Abs: 0.9 10*3/uL (ref 0.7–4.0)
MCH: 32.2 pg (ref 26.0–34.0)
MCHC: 34 g/dL (ref 30.0–36.0)
MCV: 94.9 fL (ref 80.0–100.0)
Monocytes Absolute: 0.3 10*3/uL (ref 0.1–1.0)
Monocytes Relative: 2 %
Neutro Abs: 13.8 10*3/uL — ABNORMAL HIGH (ref 1.7–7.7)
Neutrophils Relative %: 91 %
Platelets: 379 10*3/uL (ref 150–400)
RBC: 3.91 MIL/uL — ABNORMAL LOW (ref 4.22–5.81)
RDW: 11.8 % (ref 11.5–15.5)
WBC: 15.1 10*3/uL — ABNORMAL HIGH (ref 4.0–10.5)
nRBC: 0 % (ref 0.0–0.2)

## 2020-09-26 MED ORDER — ONDANSETRON HCL 4 MG/2ML IJ SOLN
4.0000 mg | Freq: Once | INTRAMUSCULAR | Status: AC
  Administered 2020-09-26: 4 mg via INTRAVENOUS
  Filled 2020-09-26: qty 2

## 2020-09-26 MED ORDER — METHOCARBAMOL 1000 MG/10ML IJ SOLN: 1000.0000 mg | Freq: Once | INTRAMUSCULAR | Status: DC

## 2020-09-26 MED ORDER — LORAZEPAM 2 MG/ML IJ SOLN
1.0000 mg | INTRAMUSCULAR | Status: AC | PRN
  Administered 2020-09-26 (×3): 1 mg via INTRAVENOUS
  Filled 2020-09-26 (×3): qty 1

## 2020-09-26 MED ORDER — METHOCARBAMOL 1000 MG/10ML IJ SOLN
1000.0000 mg | Freq: Once | INTRAVENOUS | Status: AC
  Administered 2020-09-26: 1000 mg via INTRAVENOUS
  Filled 2020-09-26: qty 1000

## 2020-09-26 MED ORDER — FENTANYL CITRATE (PF) 100 MCG/2ML IJ SOLN
100.0000 ug | INTRAMUSCULAR | Status: AC | PRN
  Administered 2020-09-26 (×4): 100 ug via INTRAVENOUS
  Filled 2020-09-26 (×4): qty 2

## 2020-09-26 MED ORDER — METHOCARBAMOL 1000 MG/10ML IJ SOLN
1000.0000 mg | Freq: Once | INTRAMUSCULAR | Status: DC
  Filled 2020-09-26: qty 10

## 2020-09-26 NOTE — ED Notes (Signed)
Pt has been given multiple doses of Lorazepam and Fentanyl. Pt stating pain is still a 50, and the pain medicine is not helping. Provider notified

## 2020-09-26 NOTE — ED Notes (Addendum)
Patient pushing the call bell constantly stating that he's in pain and that the pain medicine isn't working RN and MD made aware Patient pacing around room and in hallway, ambulating without assistance [id number]

## 2020-09-26 NOTE — ED Triage Notes (Signed)
Pt reports having neck surgery today. Pt was discharged home with pain medication, but he reports no relief. Pt reports he is panicking because the pain is so severe.

## 2020-09-26 NOTE — ED Provider Notes (Signed)
[facility]-EMERGENCY DEPT Provider Note   CSN: [id number] Arrival date & time: 09/26/20  1734     History Chief Complaint  Patient presents with  . Neck Pain    Samuel Ewing is a 50 y.o. male.  HPI The patient reports that he has generalized pain, neck pain, and anxiety since having neck surgery earlier this morning.  He had outpatient surgery of his lower neck.  He states he was given 3 mg of Dilaudid around the time of his postop recovery.  Since then he has taken some Ativan and Dilaudid at home, 1 dose each.  He states he has severe pain, and believes that he needs to be admitted for pain control.  He denies fever, chills, nausea or vomiting since discharge from the outpatient surgical center.  He states he typically takes 16 mg of Dilaudid every day.  There are no other known modifying factors.    Past Medical History:  Diagnosis Date  . Anxiety   . Hypertension   . Migraine     Patient Active Problem List   Diagnosis Date Noted  . Mild episode of recurrent major depressive disorder (HCC) 06/09/2020  . Essential hypertension 06/06/2020  . Hyperlipidemia 06/06/2020  . Migraine with aura and without status migrainosus, not intractable 06/06/2020  . Generalized anxiety disorder 06/06/2020  . Hypothyroidism 06/06/2020  . 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 Moderate episode of recurrent major depressive disorder (HCC) 06/06/2020    History reviewed. No pertinent surgical history.     Family History  Problem Relation Age of Onset  . Heart failure Mother   . Heart failure Father   . Heart attack Father     Social History   Tobacco Use  . Smoking status: Never Smoker  . Smokeless tobacco: Never Used  Vaping Use  . Vaping Use: Every day  . Substances: Nicotine  Substance Use Topics  . Alcohol use: Never  . Drug use: Yes    Types: Marijuana    Home Medications Prior to Admission medications   Medication Sig Start Date End Date Taking?  Authorizing Provider  amLODipine (NORVASC) 10 MG tablet Take 1 tablet (10 mg total) by mouth daily. 06/06/20   Marcine Matar, MD  atorvastatin (LIPITOR) 20 MG tablet Take 1 tablet (20 mg total) by mouth daily. 06/06/20   Marcine Matar, MD  busPIRone (BUSPAR) 30 MG tablet Take 1 tablet (30 mg total) by mouth 2 (two) times daily. 09/08/20   Shanna Cisco, NP  cyclobenzaprine (FLEXERIL) 5 MG tablet Take 1 tablet (5 mg total) by mouth 3 (three) times daily as needed for muscle spasms. 07/16/20   Georgetta Haber, NP  DULoxetine (CYMBALTA) 60 MG capsule Take 2 capsules (120 mg total) by mouth daily. 09/08/20   Shanna Cisco, NP  hydrOXYzine (ATARAX/VISTARIL) 50 MG tablet Take 1 tablet (50 mg total) by mouth 3 (three) times daily as needed for anxiety. 09/08/20   Shanna Cisco, NP  levothyroxine (SYNTHROID) 50 MCG tablet Take 1 tablet (50 mcg total) by mouth daily. 06/06/20   Marcine Matar, MD  lidocaine (LIDODERM) 5 % Place 1 patch onto the skin daily. Remove & Discard patch within 12 hours or as directed by MD 08/17/20   Caccavale, Sophia, PA-C  lisinopril (ZESTRIL) 20 MG tablet Take 1 tablet (20 mg total) by mouth daily. 06/06/20   Marcine Matar, MD  LORazepam (ATIVAN) 0.5 MG tablet Take 1 tablet (0.5 mg total) by  mouth daily as needed for anxiety. 09/08/20   Shanna Cisco, NP  meloxicam (MOBIC) 15 MG tablet Take 1 tablet (15 mg total) by mouth daily. 07/19/20   Renne Crigler, PA-C  SUMAtriptan (IMITREX) 100 MG tablet Take 100 mg by mouth daily as needed for migraine. May repeat in 2 hours if headache persists or recurs.    [provider]  ziprasidone (GEODON) 40 MG capsule Take 1 capsule (40 mg total) by mouth 2 (two) times daily with a meal. 09/08/20   Shanna Cisco, NP    Allergies    Morphine and related  Review of Systems   Review of Systems  All other systems reviewed and are negative.   Physical Exam Updated Vital Signs BP (!) 150/105  (BP Location: Right Arm)   Pulse (!) 131   Temp 98.2 F (36.8 C) (Oral)   Resp 18   SpO2 97%   Physical Exam Vitals and nursing note reviewed.  Constitutional:      Appearance: He is well-developed.  HENT:     Head: Normocephalic and atraumatic.     Right Ear: External ear normal.     Left Ear: External ear normal.     Mouth/Throat:     Mouth: Mucous membranes are moist.     Pharynx: No oropharyngeal exudate or posterior oropharyngeal erythema.  Eyes:     Conjunctiva/sclera: Conjunctivae normal.     Pupils: Pupils are equal, round, and reactive to light.  Neck:     Trachea: Phonation normal.  Cardiovascular:     Rate and Rhythm: Normal rate and regular rhythm.     Heart sounds: Normal heart sounds.  Pulmonary:     Effort: Pulmonary effort is normal. No respiratory distress.     Breath sounds: Normal breath sounds. No stridor.  Abdominal:     Palpations: Abdomen is soft.     Tenderness: There is no abdominal tenderness.  Musculoskeletal:        General: Normal range of motion.     Cervical back: Normal range of motion and neck supple.     Comments: Patient has addressed wound, lower anterior neck, no associated swelling or deformity.  Skin:    General: Skin is warm and dry.  Neurological:     Mental Status: He is alert and oriented to person, place, and time.     Cranial Nerves: No cranial nerve deficit.     Sensory: No sensory deficit.     Motor: No abnormal muscle tone.     Coordination: Coordination normal.  Psychiatric:        Mood and Affect: Mood normal.        Behavior: Behavior normal.        Thought Content: Thought content normal.        Judgment: Judgment normal.     ED Results / Procedures / Treatments   Labs (all labs ordered are listed, but only abnormal results are displayed) Labs Reviewed - No data to display  EKG None  Radiology No results found.  Procedures Procedures   Medications Ordered in ED Medications - No data to display  ED  Course  I have reviewed the triage vital signs and the nursing notes.  Pertinent labs & imaging results that were available during my care of the patient were reviewed by me and considered in my medical decision making (see chart for details).  Clinical Course as of 09/27/20 0023  Fri Sep 26, 2020  2112 Patient reports he  is feeling better, with decreased pain and anxiety.  He is amatory having just gone to the bathroom at this time.  He is worried about worsening discomfort, after completing treatment here.  We will continue to watch and give at least 1 more round of medications. [EW]    Clinical Course User Index [EW] Mancel Bale, MD   MDM Rules/Calculators/A&P                           Patient Vitals for the past 24 hrs:  BP Temp Temp src Pulse Resp SpO2  09/26/20 1742 (!) 150/105 98.2 F (36.8 C) Oral (!) 131 18 97 %    7:44 PM Reevaluation with update and discussion. After initial assessment and treatment, an updated evaluation reveals he reports that his anxiety is better but he still has neck pain.  We discussed his chronic pain and how is not going to eliminate chronic pain immediately.  He is agreeable to going home, after 1 more dose of pain medicine.  I discussed with patient and mother, in room, and all questions answered.  Mother taking home.  Blood pressure improved at discharge. Mancel Bale   Medical Decision Making:  This patient is presenting for evaluation of generalized pain and anxiety, which does require a range of treatment options, and is a complaint that involves a moderate risk of morbidity and mortality. The differential diagnoses include surgical complication, opiate withdrawal, benzodiazepine withdrawal,Anxiety. I decided to review old records, and in summary patient with chronic pain had cervical spine surgery today and is presenting with generalized pain and anxiety.  I did not require additional historical information from anyone.  Clinical Laboratory  Tests Ordered, included CBC and Metabolic panel. Review indicates normal except white count high, hemoglobin low.   Critical Interventions-clinical evaluation, medication treatment, repeated medication treatment and assessment, patient improved at discharge.  No sign of acute complication from surgery, or unstable psychiatric condition.  Likely having exacerbation of chronic pain and anxiety disorder, associated with stress of surgery.  Patient improved with treatment stable for discharge.  After These Interventions, the Patient was reevaluated and was found stable for discharge.  No change in treatment plan, at home  CRITICAL CARE-no Performed by: Mancel Bale  Nursing Notes Reviewed/ Care Coordinated Applicable Imaging Reviewed Interpretation of Laboratory Data incorporated into ED treatment  The patient appears reasonably screened and/or stabilized for discharge and I doubt any other medical condition or other [facility] requiring further screening, evaluation, or treatment in the ED at this time prior to discharge.  Plan: Home Medications-continue usual; Home Treatments-rest, fluids; return here if the recommended treatment, does not improve the symptoms; Recommended follow up-orthopedic surgery as scheduled    Final Clinical Impression(s) / ED Diagnoses Final diagnoses:  Neck pain  Chronic pain syndrome    Rx / DC Orders ED Discharge Orders    None       Mancel Bale, MD 09/29/20 1015
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
## 2020-09-27 MED ORDER — FENTANYL CITRATE (PF) 100 MCG/2ML IJ SOLN
100.0000 ug | Freq: Once | INTRAMUSCULAR | Status: AC
  Administered 2020-09-27: 100 ug via INTRAVENOUS
  Filled 2020-09-27: qty 2

## 2020-09-27 NOTE — Discharge Instructions (Addendum)
Follow-up with Dr. Shon Baton, your neck surgeon as needed for problems

## 2020-09-29 ENCOUNTER — Encounter (HOSPITAL_COMMUNITY): Payer: Self-pay

## 2020-09-29 ENCOUNTER — Other Ambulatory Visit: Payer: Self-pay

## 2020-09-29 DIAGNOSIS — R339 Retention of urine, unspecified: Secondary | ICD-10-CM | POA: Insufficient documentation

## 2020-09-29 DIAGNOSIS — R32 Unspecified urinary incontinence: Secondary | ICD-10-CM | POA: Insufficient documentation

## 2020-09-29 DIAGNOSIS — Z9889 Other specified postprocedural states: Secondary | ICD-10-CM | POA: Diagnosis not present

## 2020-09-29 DIAGNOSIS — G8929 Other chronic pain: Secondary | ICD-10-CM | POA: Insufficient documentation

## 2020-09-29 DIAGNOSIS — I10 Essential (primary) hypertension: Secondary | ICD-10-CM | POA: Diagnosis not present

## 2020-09-29 DIAGNOSIS — G8918 Other acute postprocedural pain: Secondary | ICD-10-CM | POA: Insufficient documentation

## 2020-09-29 DIAGNOSIS — Z79899 Other long term (current) drug therapy: Secondary | ICD-10-CM | POA: Insufficient documentation

## 2020-09-29 DIAGNOSIS — E039 Hypothyroidism, unspecified: Secondary | ICD-10-CM | POA: Insufficient documentation

## 2020-09-29 DIAGNOSIS — M542 Cervicalgia: Secondary | ICD-10-CM | POA: Insufficient documentation

## 2020-09-29 NOTE — ED Triage Notes (Signed)
Pt returns to ER for pain in neck. Surgery , and seen in ER last Friday for same.

## 2020-09-29 NOTE — ED Provider Notes (Signed)
MSE was initiated and I personally evaluated the patient and placed orders (if any) at  11:38 PM on September 29, 2020.  Patient is a 50 yo male who presents to the ED with complaints of neck pain & anxiety. Recent neck surgery. Problem with his outpatient pain medications. No new numbness/weakness.   Wearing neck brace.  Sensation grossly intact to bilateral upper extremities.  5/5 symmetric grip strength.   The patient appears stable so that the remainder of the MSE may be completed by another provider.   Samuel Ewing 09/29/20 2339    Dione Booze, MD 09/30/20 [phone]

## 2020-09-30 ENCOUNTER — Emergency Department (HOSPITAL_COMMUNITY)
Admission: EM | Admit: 2020-09-30 | Discharge: 2020-09-30 | Disposition: A | Discharge status: Home / Self Care | Payer: 59 | Attending: Emergency Medicine | Admitting: Emergency Medicine

## 2020-09-30 ENCOUNTER — Emergency Department (HOSPITAL_COMMUNITY): Payer: 59

## 2020-09-30 ENCOUNTER — Emergency Department (HOSPITAL_COMMUNITY)
Admission: EM | Admit: 2020-09-30 | Discharge: 2020-09-30 | Disposition: A | Discharge status: Home / Self Care | Payer: 59 | Source: Home / Self Care | Attending: Emergency Medicine | Admitting: Emergency Medicine

## 2020-09-30 ENCOUNTER — Encounter (HOSPITAL_COMMUNITY): Payer: Self-pay | Admitting: Emergency Medicine

## 2020-09-30 DIAGNOSIS — Z79899 Other long term (current) drug therapy: Secondary | ICD-10-CM | POA: Insufficient documentation

## 2020-09-30 DIAGNOSIS — M542 Cervicalgia: Secondary | ICD-10-CM

## 2020-09-30 DIAGNOSIS — E039 Hypothyroidism, unspecified: Secondary | ICD-10-CM | POA: Insufficient documentation

## 2020-09-30 DIAGNOSIS — R32 Unspecified urinary incontinence: Secondary | ICD-10-CM | POA: Insufficient documentation

## 2020-09-30 DIAGNOSIS — I10 Essential (primary) hypertension: Secondary | ICD-10-CM | POA: Insufficient documentation

## 2020-09-30 DIAGNOSIS — Z9889 Other specified postprocedural states: Secondary | ICD-10-CM

## 2020-09-30 DIAGNOSIS — G8928 Other chronic postprocedural pain: Secondary | ICD-10-CM

## 2020-09-30 DIAGNOSIS — R339 Retention of urine, unspecified: Secondary | ICD-10-CM | POA: Insufficient documentation

## 2020-09-30 LAB — I-STAT CREATININE, ED: Creatinine, Ser: 0.8 mg/dL (ref 0.61–1.24)

## 2020-09-30 IMAGING — MR MR THORACIC SPINE W/O CM
6 series · 36 of 48 positions shown · non-contrast
Comparison: None.

CLINICAL DATA: Cervical spine surgery 4 days prior. New urinary
incontinence. Rule out cord compression.

EXAM:
MRI THORACIC SPINE WITHOUT CONTRAST
TECHNIQUE: Multiplanar, multisequence MR imaging of the thoracic spine was
performed. No intravenous contrast was administered.

[Series 16: T1 · sagittal · 4.0mm · 1.72mm/px · 3 of 5 slices shown (1 of 2)]
[im 1/5]
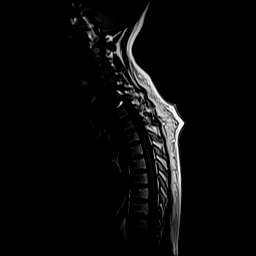
[im 3/5]
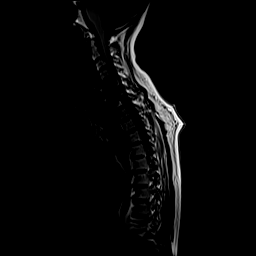
[im 5/5]
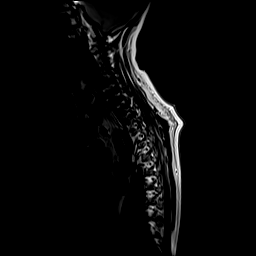

[Series 17: STIR · sagittal · 3.0mm · 1.06mm/px · 5 of 15 slices shown]
[im 1/15]
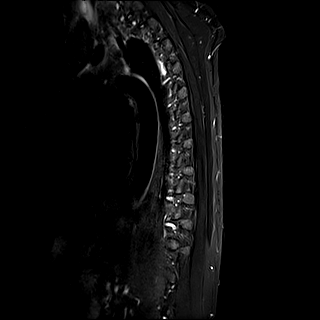
[im 4/15]
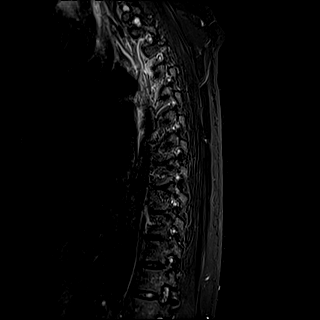
[im 8/15]
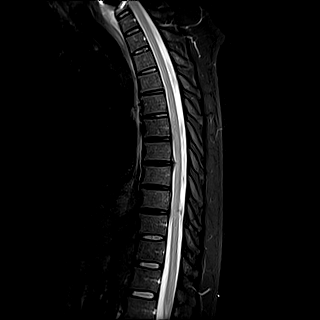
[im 11/15]
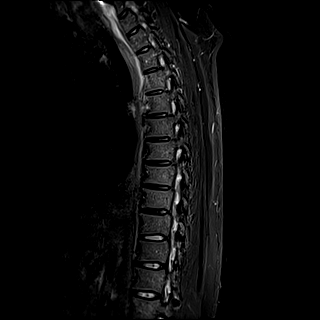
[im 15/15]
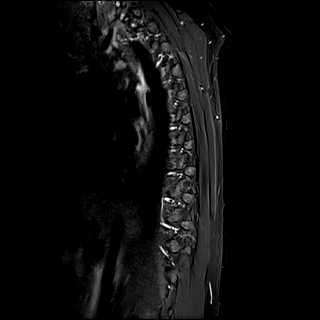

[Series 18: T1 · sagittal · 3.0mm · 1.00mm/px · 5 of 15 slices shown (2 of 2)]
[im 1/15]
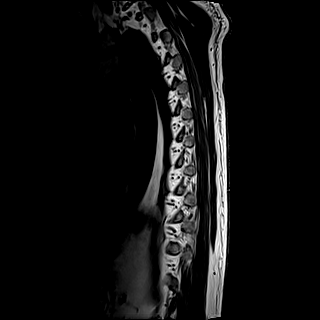
[im 4/15]
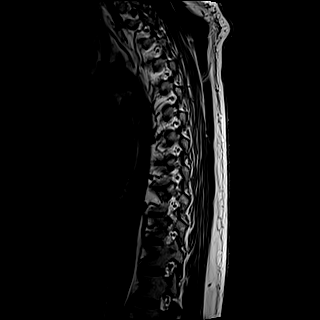
[im 8/15]
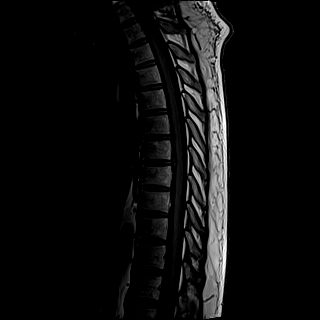
[im 11/15]
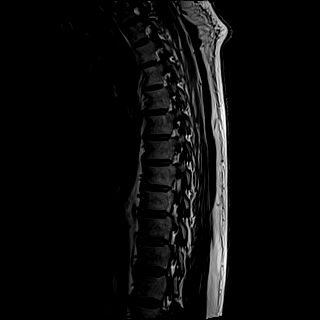
[im 15/15]
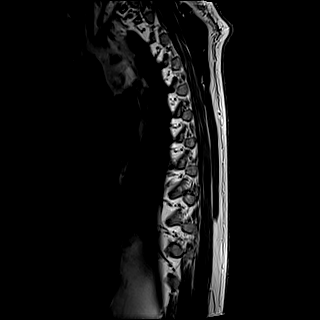

[Series 19: T2 · sagittal · 3.0mm · 0.83mm/px · 5 of 15 slices shown (1 of 2)]
[im 1/15]
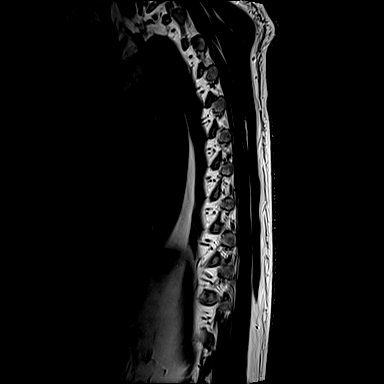
[im 4/15]
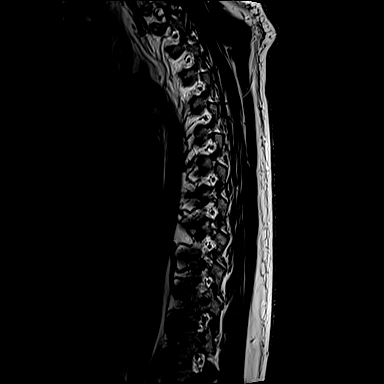
[im 8/15]
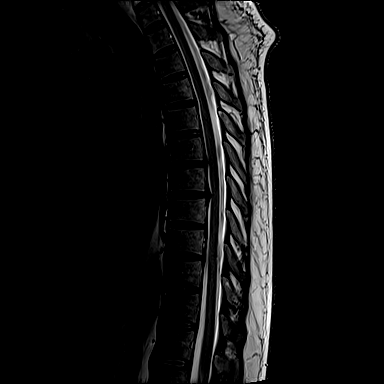
[im 11/15]
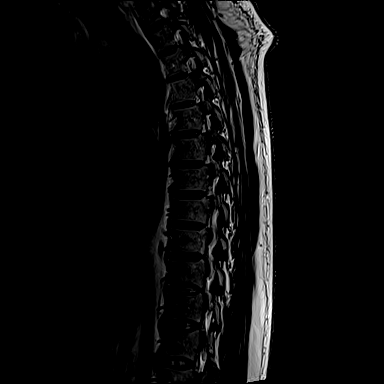
[im 15/15]
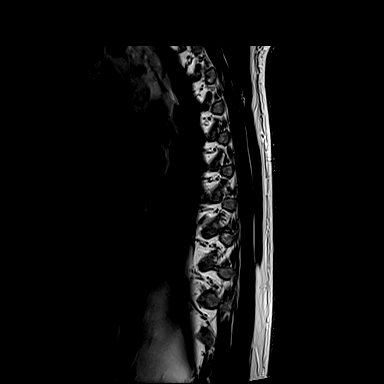

[Series 20: T2 · axial · 4.0mm · 0.78mm/px · z∈[-265,-20]mm · 10 of 43 slices shown (2 of 2)]
[im 1/43]
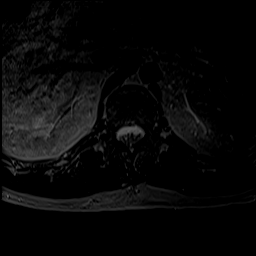
[im 4/43]
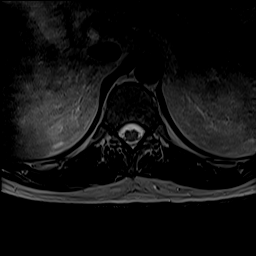
[im 7/43]
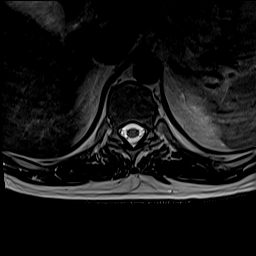
[im 13/43]
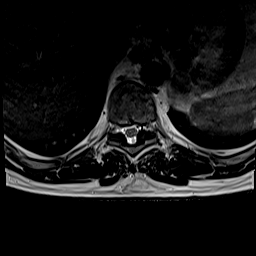
[im 19/43]
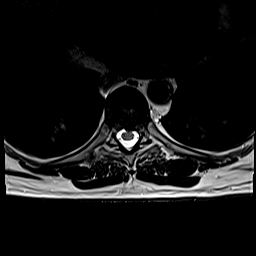
[im 22/43]
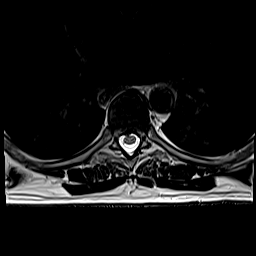
[im 25/43]
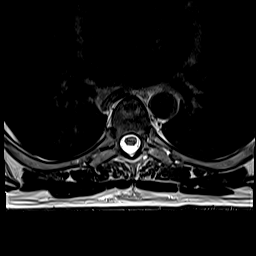
[im 31/43]
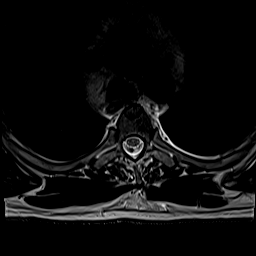
[im 37/43]
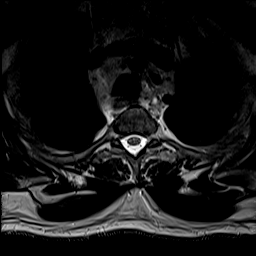
[im 43/43]
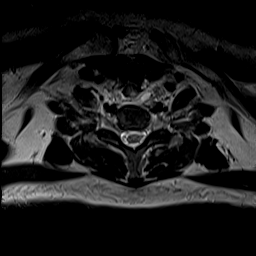

[Series 21: t2_me2d_tra · axial · 4.0mm · 0.39mm/px · z∈[-265,-20]mm · 8 of 43 slices shown]
[im 1/43]
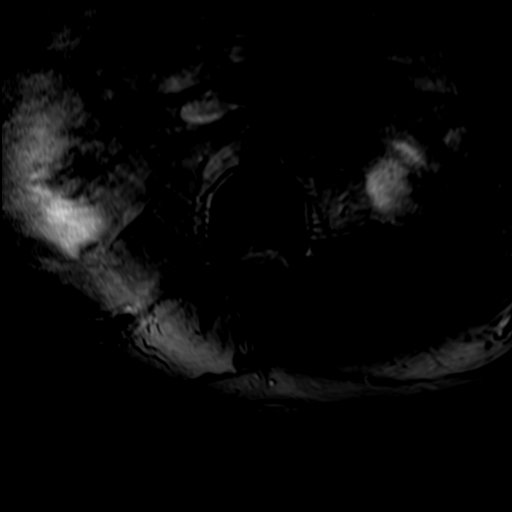
[im 7/43]
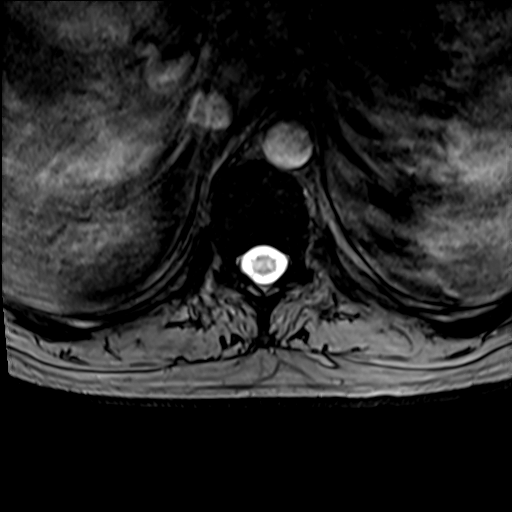
[im 13/43]
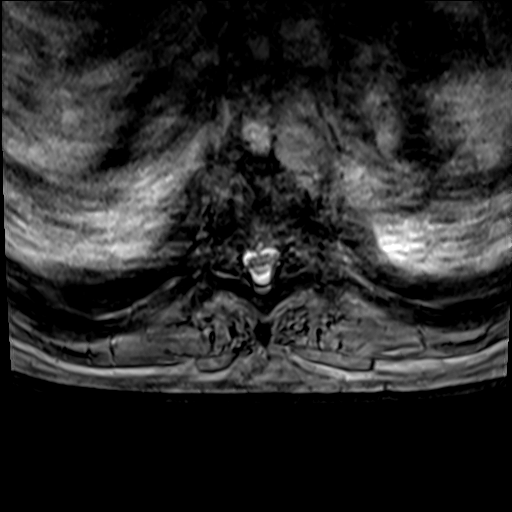
[im 19/43]
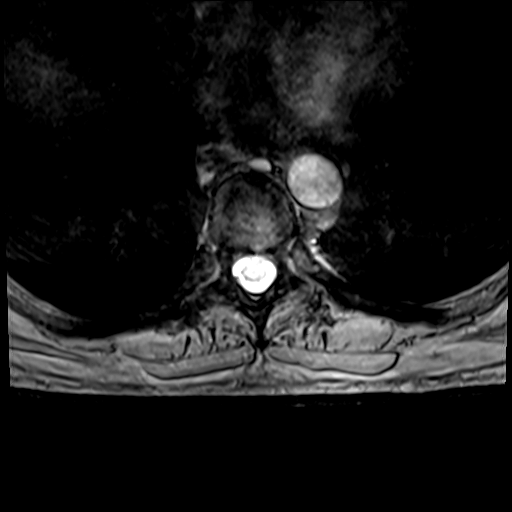
[im 25/43]
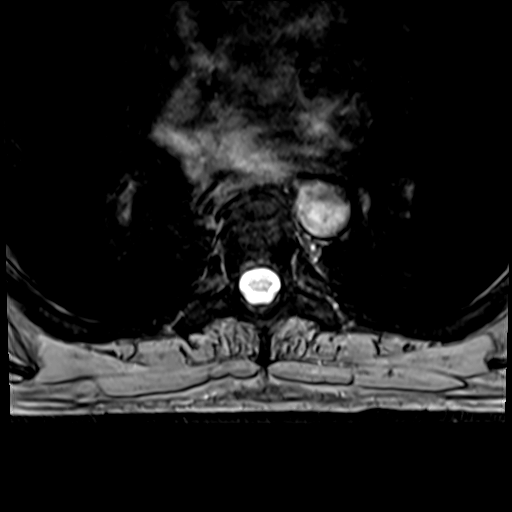
[im 31/43]
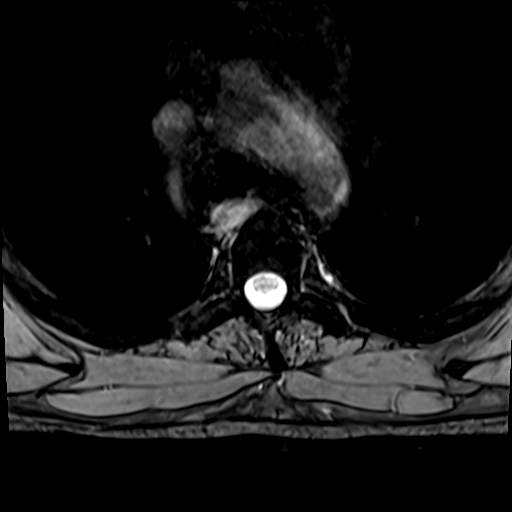
[im 37/43]
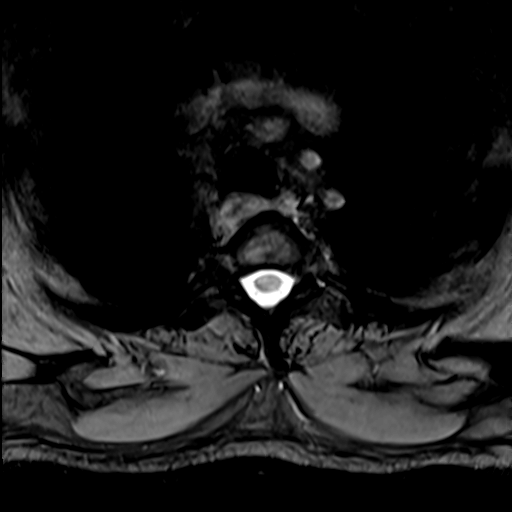
[im 43/43]
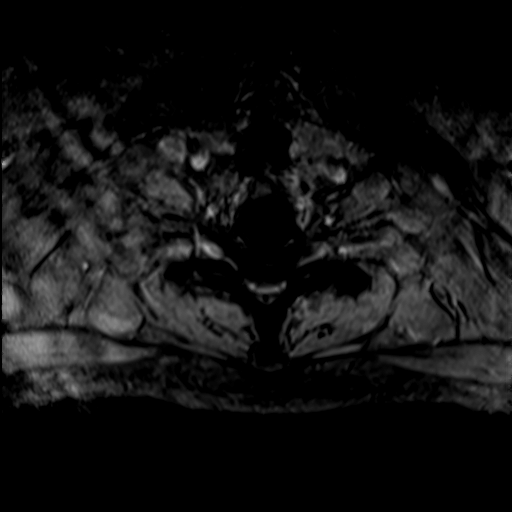

[36 of 48 positions shown; findings below may reference images not displayed]

FINDINGS: Alignment:  Normal

Vertebrae: Normal bone marrow. Negative for fracture, mass, or
infection.

Cord:  Normal signal and morphology.  No cord compression.

Paraspinal and other soft tissues: Prevertebral edema in the
cervical spine extending into the upper thoracic spine. No loculated
fluid. This is most likely related to recent cervical spine surgery.

Disc levels:

T1-2: Negative

T2-3: Negative

T3-4: Negative

T4-5: Negative

T5-6: Negative

T6-7: Small right paracentral disc protrusion touching the cord. No
cord deformity or stenosis

T7-8: Mild to moderate left paracentral disc protrusion touching the
cord with cord flattening. Bilateral facet degeneration. Negative
for stenosis.

T8-9: Bilateral mild facet degeneration.  Negative for stenosis

T9-10: Small central disc protrusion. Bilateral facet degeneration.
Negative for stenosis.

T10-11: Bilateral facet degeneration.  Negative for stenosis

T11-12: Mild facet degeneration.  Negative for stenosis

T12-L1: Negative
IMPRESSION: Negative for cord compression

Multilevel degenerative change in the thoracic spine

Prevertebral soft tissue swelling in the cervical and upper thoracic
spine consistent with recent cervical spine surgery.

## 2020-09-30 IMAGING — MR MR LUMBAR SPINE W/O CM
5 series · 32 of 48 positions shown · non-contrast
Comparison: None.

CLINICAL DATA: Cervical spine surgery 4 days ago. Urinary
incontinence. Rule out cauda equina syndrome.

EXAM:
MRI LUMBAR SPINE WITHOUT CONTRAST
TECHNIQUE: Multiplanar, multisequence MR imaging of the lumbar spine was
performed. No intravenous contrast was administered.

[Series 5: T1 · sagittal · 4.0mm · 0.81mm/px · 6 of 17 slices shown (1 of 2)]
[im 1/17]
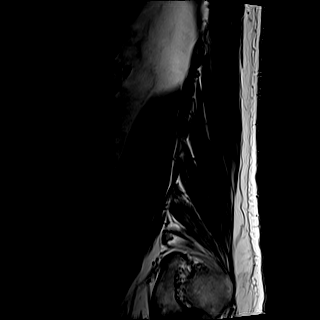
[im 4/17]
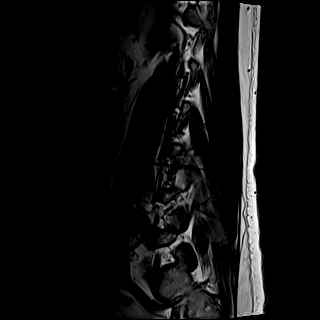
[im 7/17]
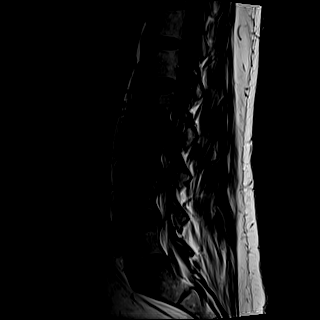
[im 10/17]
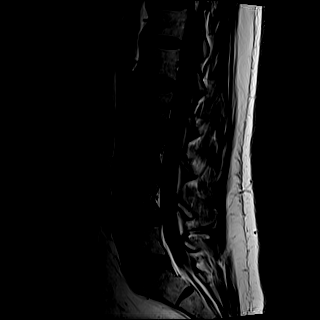
[im 13/17]
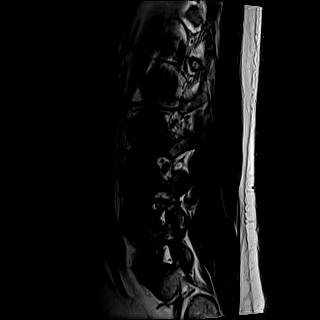
[im 17/17]
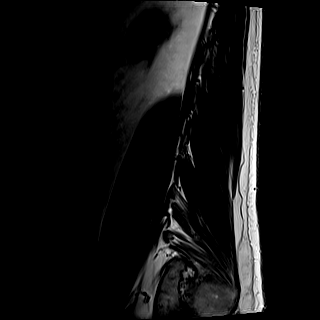

[Series 6: T2 · sagittal · 4.0mm · 0.81mm/px · 6 of 17 slices shown (1 of 2)]
[im 1/17]
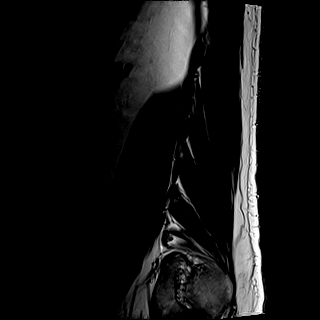
[im 4/17]
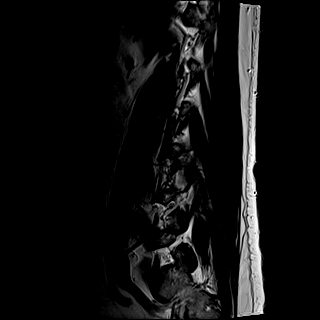
[im 7/17]
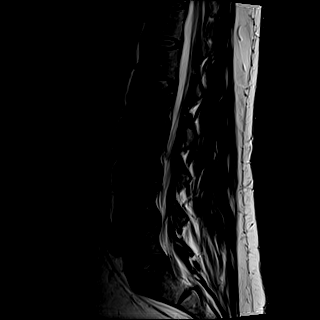
[im 10/17]
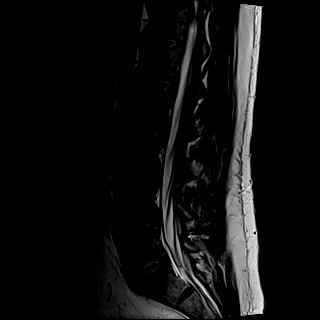
[im 13/17]
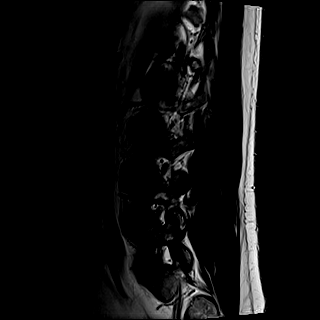
[im 17/17]
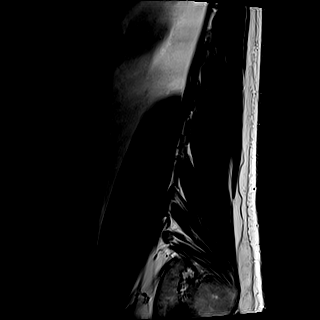

[Series 7: STIR · sagittal · 4.0mm · 0.51mm/px · 2 of 17 slices shown]
[im 1/17]
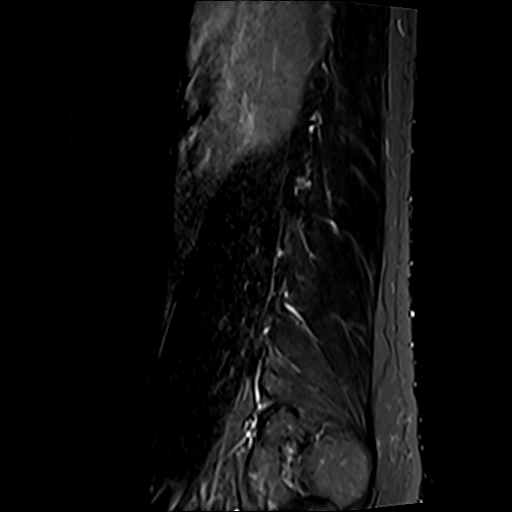
[im 4/17]
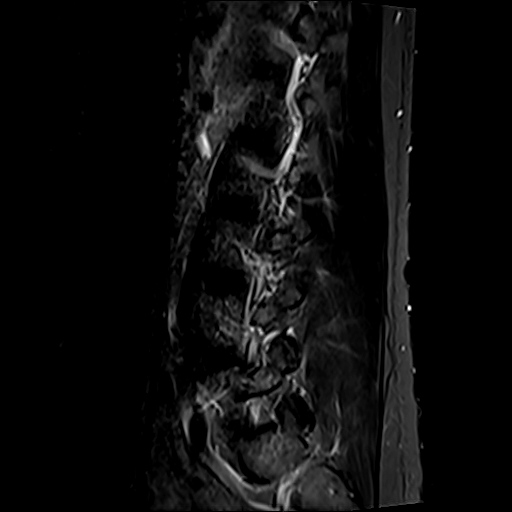

[Series 8: T2 · axial · 4.0mm · 0.62mm/px · z∈[-129,+104]mm · 9 of 45 slices shown (2 of 2)]
[im 1/45]
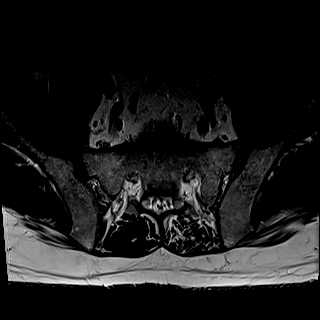
[im 7/45]
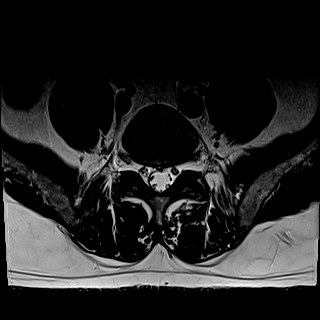
[im 13/45]
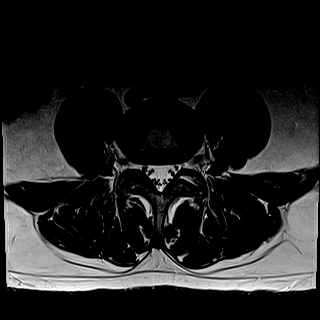
[im 19/45]
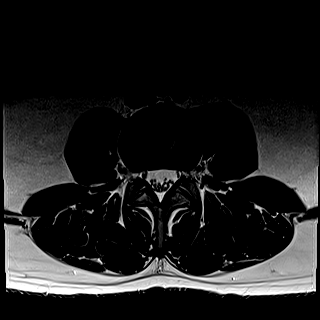
[im 23/45]
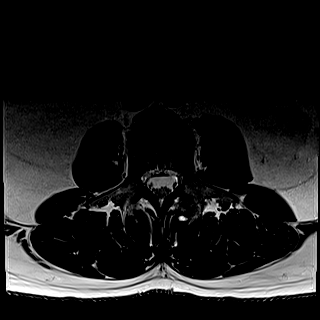
[im 26/45]
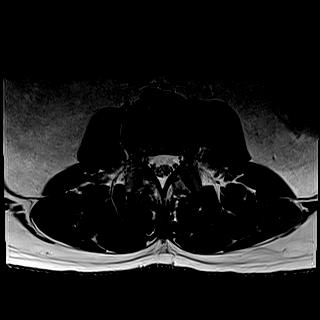
[im 32/45]
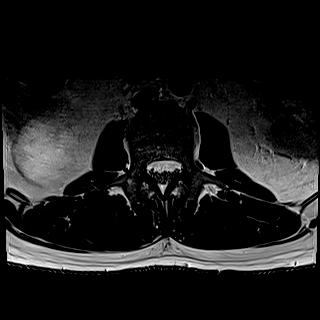
[im 38/45]
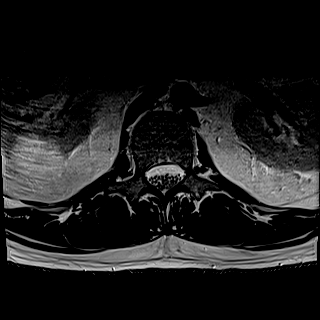
[im 45/45]
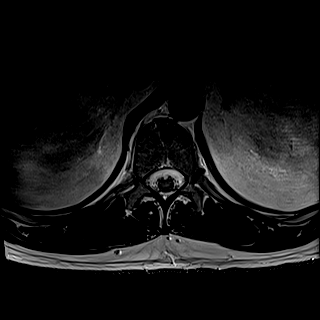

[Series 9: T1 · axial · 4.0mm · 0.39mm/px · z∈[-129,+104]mm · 9 of 45 slices shown (2 of 2)]
[im 1/45]
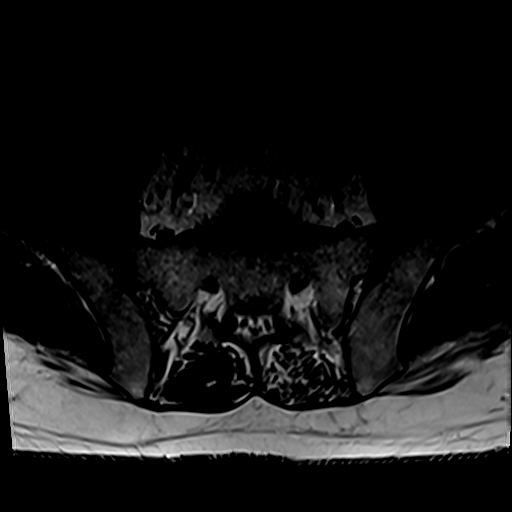
[im 7/45]
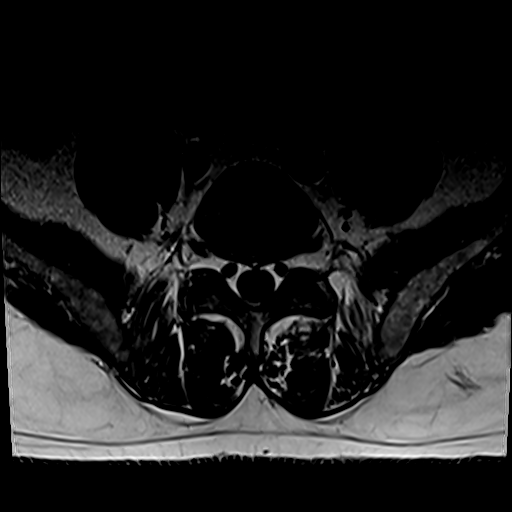
[im 13/45]
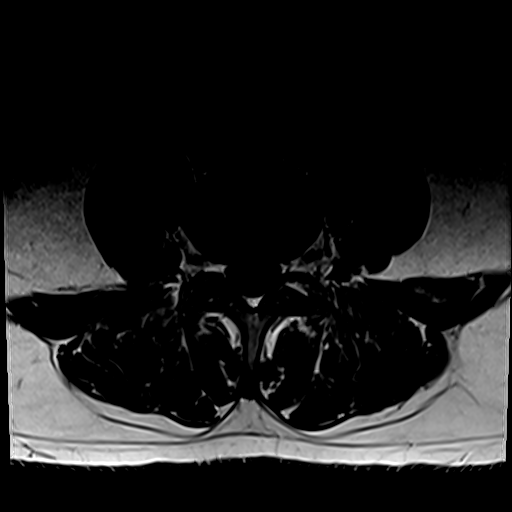
[im 19/45]
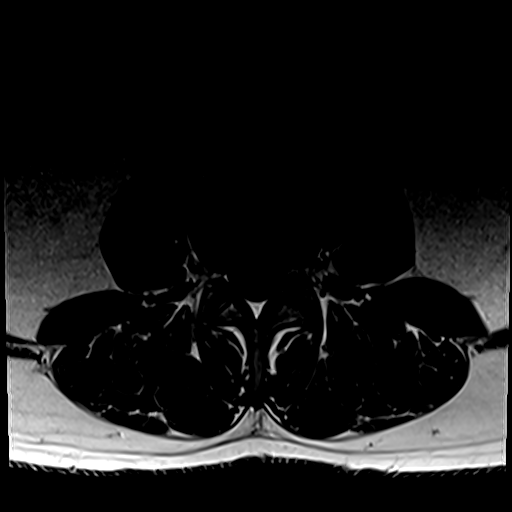
[im 23/45]
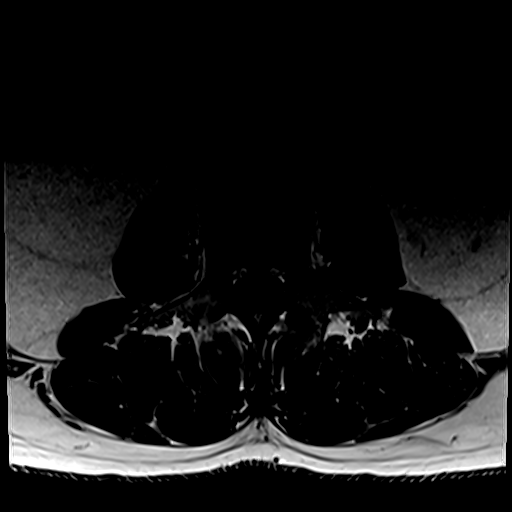
[im 26/45]
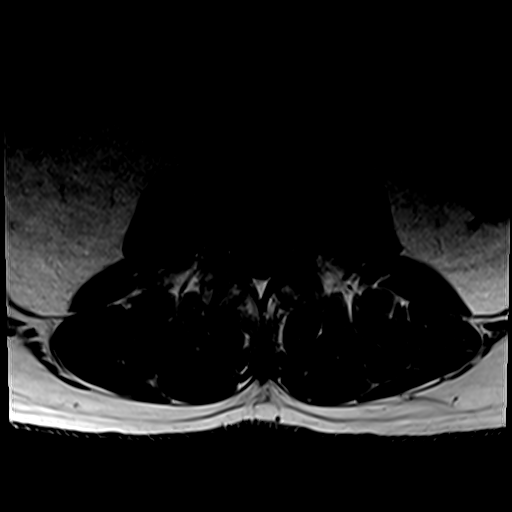
[im 32/45]
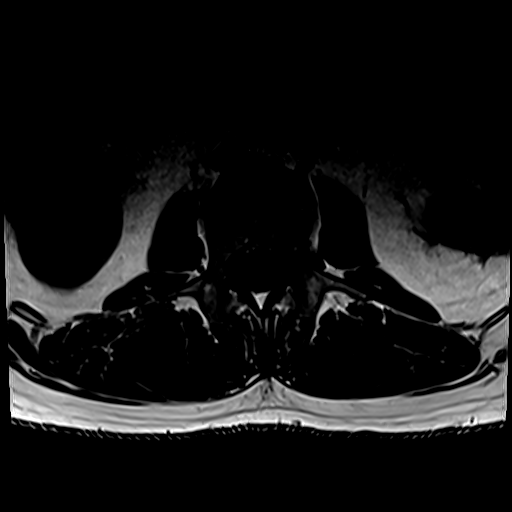
[im 38/45]
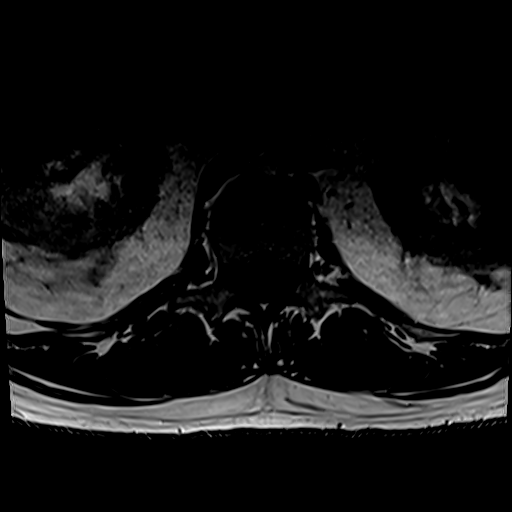
[im 45/45]
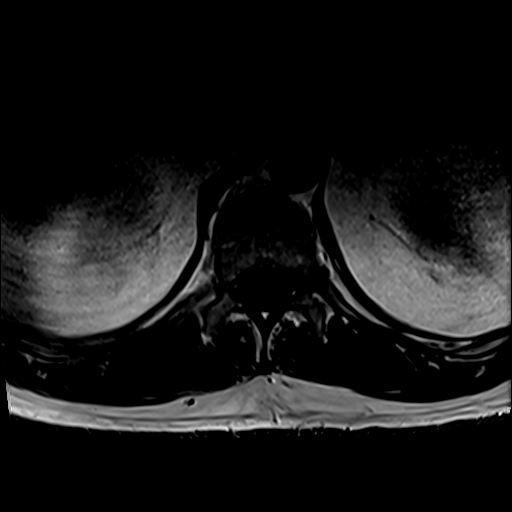

[32 of 48 positions shown; findings below may reference images not displayed]

FINDINGS: Segmentation:  Normal

Alignment:  Normal

Vertebrae: Normal bone marrow. Negative for fracture, mass, or
infection

Conus medullaris and cauda equina: Conus extends to the L1-2 level.
Conus and cauda equina appear normal.

Paraspinal and other soft tissues: Negative for paraspinous mass,
adenopathy, fluid collection

Disc levels:

Normal disc spaces.  No degenerative change or neural impingement.
IMPRESSION: Normal MRI lumbar spine.

## 2020-09-30 IMAGING — MR MR CERVICAL SPINE W/O CM
5 series · 40 of 48 positions shown · non-contrast
Comparison: None.

CLINICAL DATA: Cervical radiculopathy. Recent cervical spine
surgery approximately 4 days prior.

EXAM:
MRI CERVICAL SPINE WITHOUT CONTRAST
TECHNIQUE: Multiplanar, multisequence MR imaging of the cervical spine was
performed. No intravenous contrast was administered.

[Series 5: T1 · sagittal · 3.0mm · 0.69mm/px · 6 of 15 slices shown]
[im 1/15]
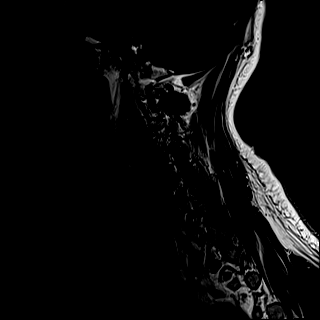
[im 3/15]
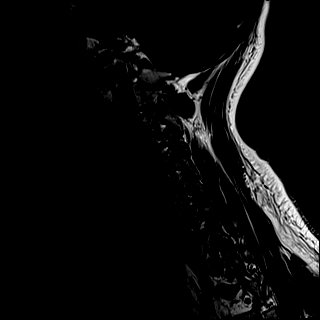
[im 6/15]
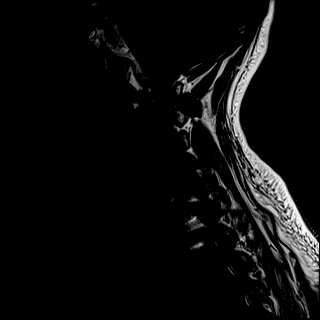
[im 9/15]
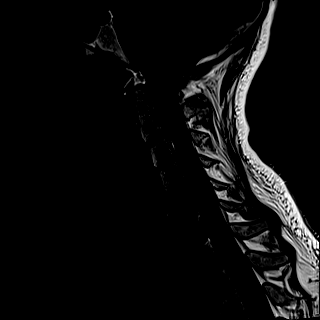
[im 12/15]
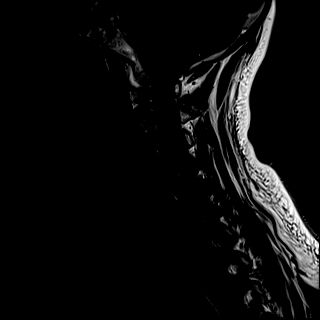
[im 15/15]
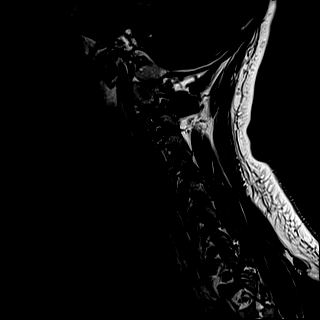

[Series 6: T2 · sagittal · 3.0mm · 0.69mm/px · 7 of 15 slices shown (1 of 2)]
[im 1/15]
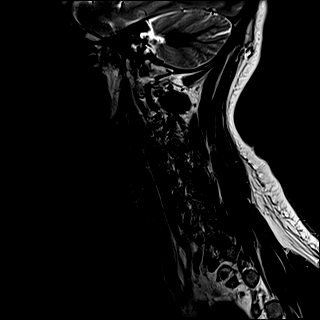
[im 3/15]
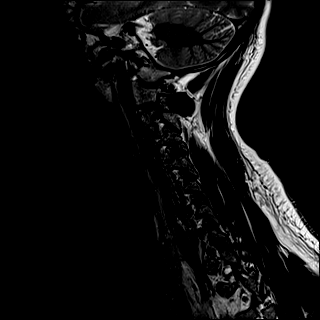
[im 5/15]
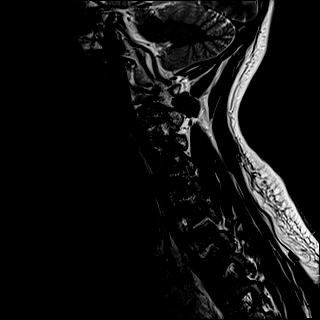
[im 8/15]
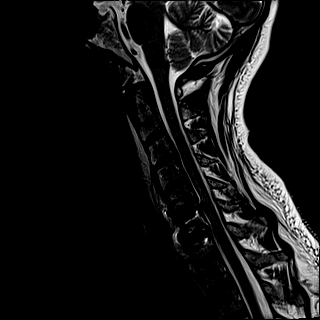
[im 10/15]
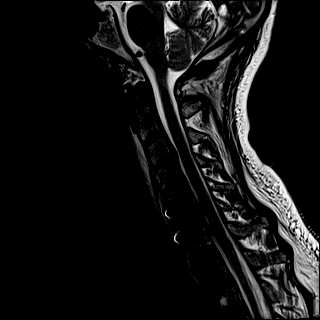
[im 12/15]
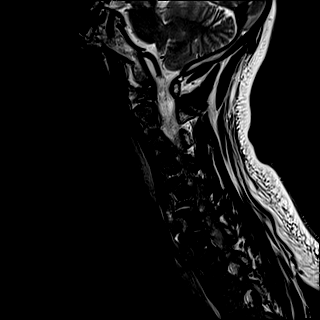
[im 15/15]
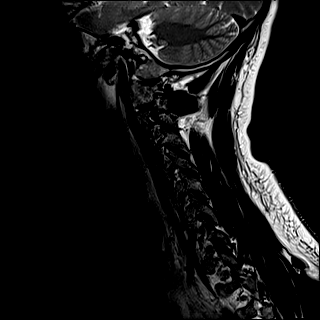

[Series 7: STIR · sagittal · 3.0mm · 0.86mm/px · 7 of 15 slices shown]
[im 1/15]
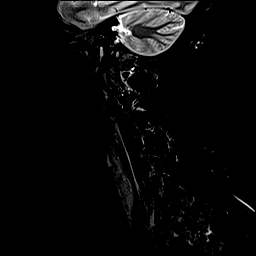
[im 3/15]
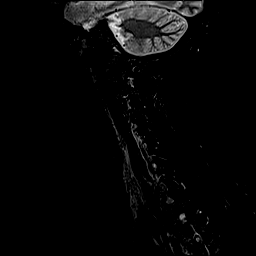
[im 5/15]
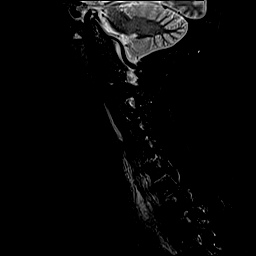
[im 8/15]
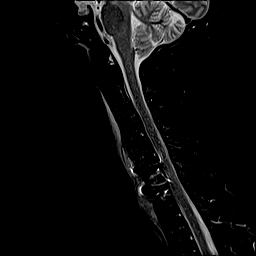
[im 10/15]
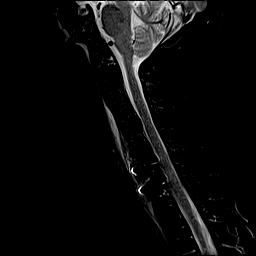
[im 12/15]
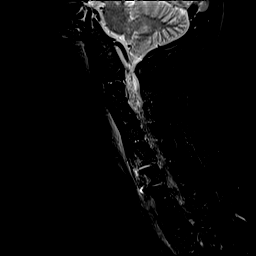
[im 15/15]
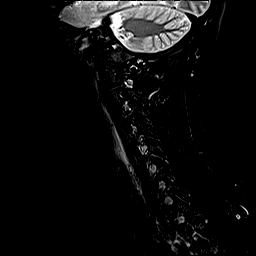

[Series 8: T2 · axial · 3.0mm · 0.70mm/px · z∈[-37,+63]mm · 12 of 31 slices shown (2 of 2)]
[im 1/31]
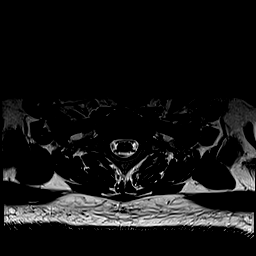
[im 3/31]
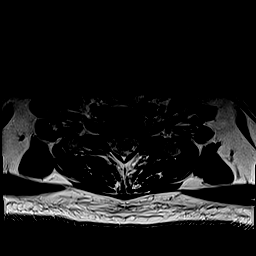
[im 5/31]
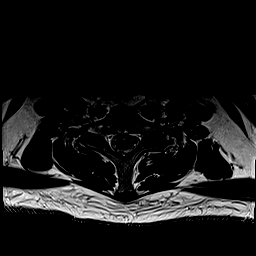
[im 7/31]
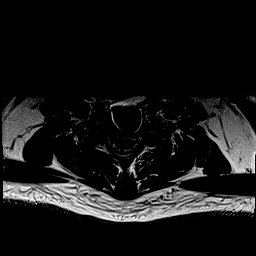
[im 10/31]
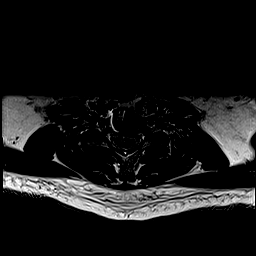
[im 12/31]
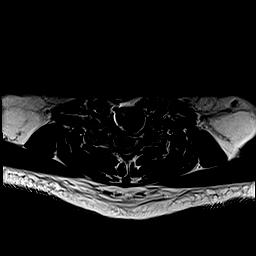
[im 14/31]
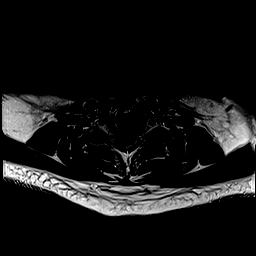
[im 17/31]
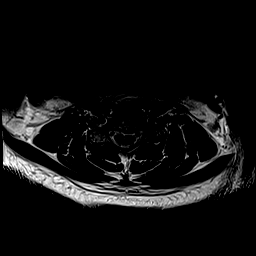
[im 19/31]
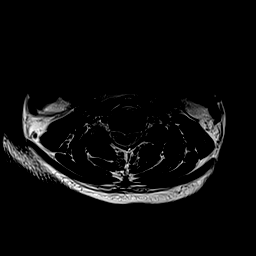
[im 21/31]
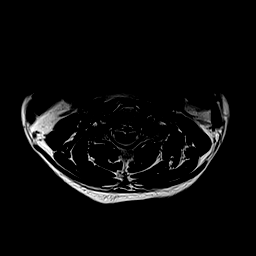
[im 26/31]
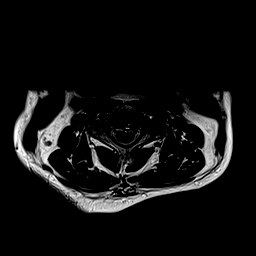
[im 31/31]
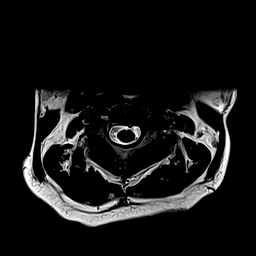

[Series 9: GRE · axial · 3.0mm · 0.35mm/px · z∈[-37,+63]mm · 8 of 31 slices shown]
[im 1/31]
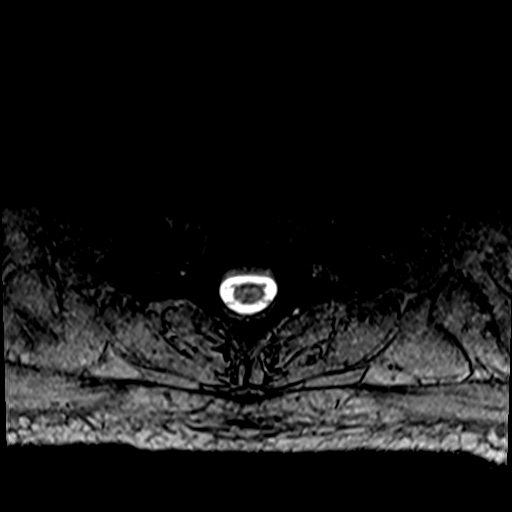
[im 5/31]
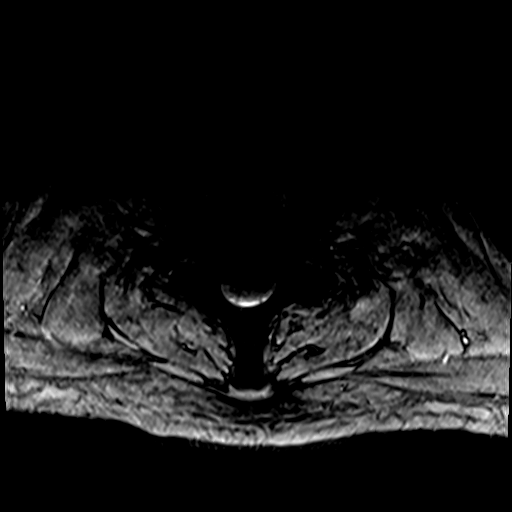
[im 10/31]
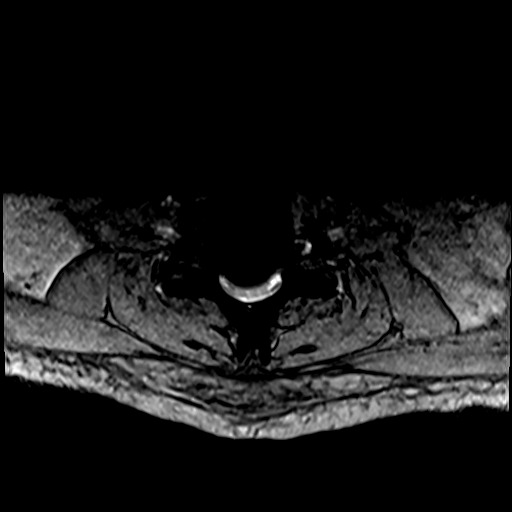
[im 14/31]
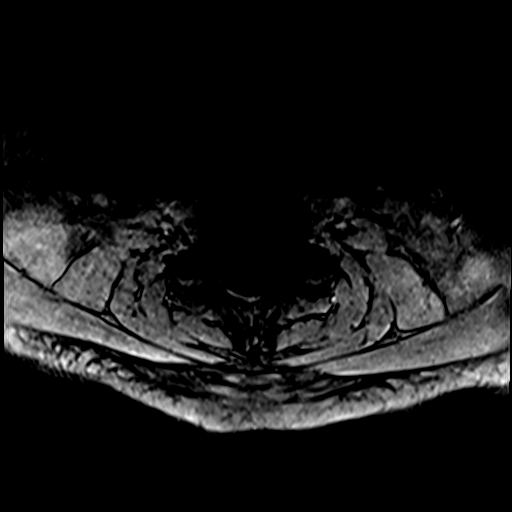
[im 17/31]
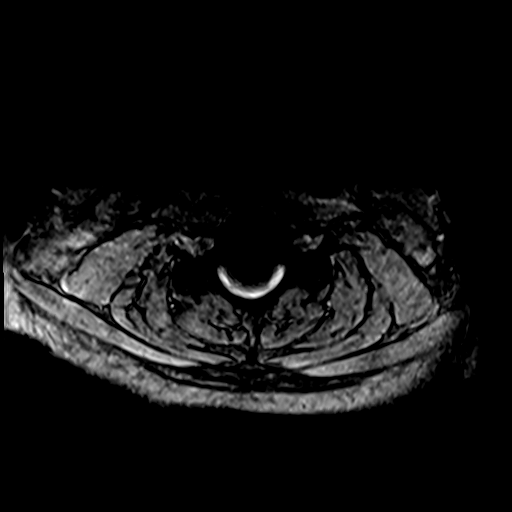
[im 21/31]
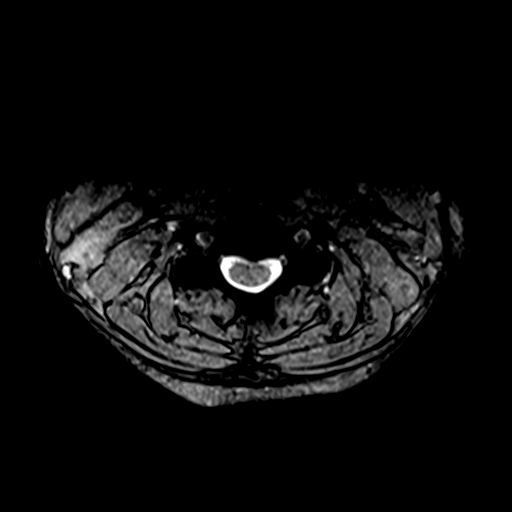
[im 26/31]
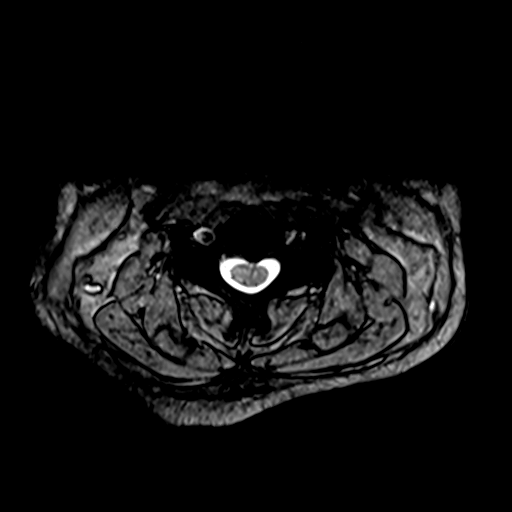
[im 31/31]
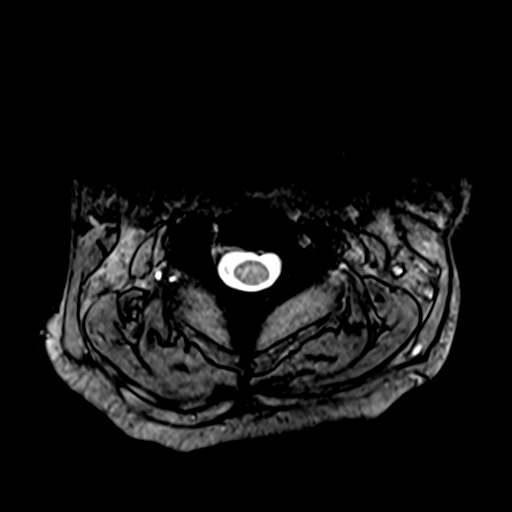

[40 of 48 positions shown; findings below may reference images not displayed]

FINDINGS: Alignment: Normal alignment.  Straightening of the cervical lordosis

Vertebrae: Normal bone marrow.  Negative for fracture or mass.

Cord: Normal signal and morphology.

Posterior Fossa, vertebral arteries, paraspinal tissues:
Prevertebral edema bilaterally throughout the cervical spine. No
associated bone marrow edema.

Disc levels:

C2-3: Negative

C3-4: Small central disc protrusion mild facet degeneration and mild
foraminal stenosis bilaterally.

C4-5: Small central disc protrusion.  Negative for stenosis

C5-6: Interbody metal prosthesis at C5-6. This is causing local
artifact. No significant stenosis

C6-7: Interbody metal prosthesis at C6-7. This is causing local
artifact. No significant stenosis

C7-T1: Negative
IMPRESSION: Disc prosthesis at C5-6 and C6-7 without significant stenosis

Central disc protrusion C3-4 with mild foraminal narrowing
bilaterally. Small central disc protrusion C4-5

Prevertebral soft tissue edema throughout the cervical spine
bilaterally extending into the upper cervicothoracic spine,
consistent with recent cervical spine surgery.

## 2020-09-30 MED ORDER — ONDANSETRON HCL 4 MG/2ML IJ SOLN
4.0000 mg | Freq: Once | INTRAMUSCULAR | Status: AC
  Administered 2020-09-30: 4 mg via INTRAVENOUS
  Filled 2020-09-30: qty 2

## 2020-09-30 MED ORDER — OXYCODONE-ACETAMINOPHEN 5-325 MG PO TABS
1.0000 | ORAL_TABLET | Freq: Once | ORAL | Status: DC
Start: 2020-09-30 — End: 2020-09-30

## 2020-09-30 MED ORDER — LORAZEPAM 2 MG/ML IJ SOLN
1.0000 mg | Freq: Once | INTRAMUSCULAR | Status: AC
  Administered 2020-09-30: 1 mg via INTRAVENOUS
  Filled 2020-09-30: qty 1

## 2020-09-30 MED ORDER — HYDROMORPHONE HCL 1 MG/ML IJ SOLN
1.0000 mg | Freq: Once | INTRAMUSCULAR | Status: AC
  Administered 2020-09-30: 1 mg via INTRAVENOUS
  Filled 2020-09-30: qty 1

## 2020-09-30 MED ORDER — HYDROMORPHONE HCL 2 MG/ML IJ SOLN
2.0000 mg | Freq: Once | INTRAMUSCULAR | Status: AC
  Administered 2020-09-30: 2 mg via INTRAVENOUS
  Filled 2020-09-30: qty 1

## 2020-09-30 MED ORDER — METHOCARBAMOL 500 MG PO TABS: 500.0000 mg | ORAL_TABLET | Freq: Four times a day (QID) | ORAL | 0 refills | Status: DC | PRN

## 2020-09-30 MED ORDER — METHOCARBAMOL 1000 MG/10ML IJ SOLN
1000.0000 mg | Freq: Once | INTRAVENOUS | Status: AC
  Administered 2020-09-30: 1000 mg via INTRAVENOUS
  Filled 2020-09-30: qty 1000

## 2020-09-30 NOTE — ED Notes (Signed)
Unable to update vital signs, pt at MRI.

## 2020-09-30 NOTE — ED Notes (Signed)
Pt removed neck brace and is ambulating in room without difficulty. Pt asked to lay in bed due to medications he received. Pt is nodding on and off in the bed falling asleep. Pt stating his pain is still an 8/10 and is continuing to fall asleep

## 2020-09-30 NOTE — ED Notes (Signed)
Pt states he is anxious

## 2020-09-30 NOTE — ED Triage Notes (Signed)
Emergency Medicine Provider Triage Evaluation Note  EZIAH NEGRO , a 50 y.o. male  was evaluated in triage.  Pt complains of ongoing pain. He was seen in the ER early this morning and sent home. He denies any changes since. He reports issues with insurance company and his pain meds.    Physical Exam  There were no vitals taken for this visit. Patient is awake alert in no distress. Respirations are even and unlabored.  Soft neck collar in place.  Appears anxious, pacing and ambulatory in room.   Medical Decision Making  Medically screening exam initiated at 12:44 PM.  Appropriate orders placed.  Dardan Shelton Arndt was informed that the remainder of the evaluation will be completed by another provider, this initial triage assessment does not replace that evaluation, and the importance of remaining in the ED until their evaluation is complete.     Cristina Gong, [place] 09/30/20 1246

## 2020-09-30 NOTE — ED Notes (Signed)
Pt ambulating without difficulty in hallway

## 2020-09-30 NOTE — Discharge Instructions (Addendum)
You need to work with Designer, industrial/product and pain doctor to find a way to get your pain under control.

## 2020-09-30 NOTE — ED Notes (Signed)
Bladder scan shows 0ml °
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
## 2020-09-30 NOTE — Discharge Instructions (Addendum)
Came to the emergency department to be assessed for your neck pain following your surgery.  Based on your symptoms of urinary retention and incontinence there was concern for injury to your spine.  Your physical exam and MRIs were reassuring.  No acute spinal injury was seen today.  These follow-up with your orthopedic surgeon Dr. Shon Baton on Friday.  Call his office tomorrow for further details on your appointment.  Get help right away if: Your pain gets much worse and cannot be controlled with medicines. You have weakness or numbness in your hand, arm, face, or leg. You have a high fever. You have a stiff, rigid neck. You lose control of your bowels or your bladder (have incontinence). You have trouble with walking, balance, or speaking.

## 2020-09-30 NOTE — ED Provider Notes (Signed)
[facility]-EMERGENCY DEPT Provider Note   CSN: [id number] Arrival date & time: 09/29/20  2306   History Chief Complaint  Patient presents with  . Pain    Samuel Ewing is a 50 y.o. male.  The history is provided by the patient.  He has history of hypertension, hyperlipidemia, migraines, chronic neck pain and comes in because of ongoing neck pain.  He had neck surgery 3 days ago and has had ongoing pain since then.  He was seen in the emergency department later that day and received fentanyl, methocarbamol, lorazepam with some temporary improvement.  He has been taking hydromorphone 4 mg 4 times a day without relief.  He apparently had been in touch with his surgeon who had ordered a higher dose of hydromorphone but there was some difficulty with insurance coverage and he was not able to get the prescription filled.  He is complaining of ongoing severe lower neck pain.  There is some numbness going into the left shoulder which had been present prior to the surgery.  He denies any weakness.  He denies any bowel or bladder dysfunction.  Past Medical History:  Diagnosis Date  . Anxiety   . Hypertension   . Migraine     Patient Active Problem List   Diagnosis Date Noted  . Mild episode of recurrent major depressive disorder (HCC) 06/09/2020  . Essential hypertension 06/06/2020  . Hyperlipidemia 06/06/2020  . Migraine with aura and without status migrainosus, not intractable 06/06/2020  . Generalized anxiety disorder 06/06/2020  . Hypothyroidism 06/06/2020  . 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 Moderate episode of recurrent major depressive disorder (HCC) 06/06/2020    History reviewed. No pertinent surgical history.     Family History  Problem Relation Age of Onset  . Heart failure Mother   . Heart failure Father   . Heart attack Father     Social History   Tobacco Use  . Smoking status: Never Smoker  . Smokeless tobacco: Never Used  Vaping Use  .  Vaping Use: Every day  . Substances: Nicotine  Substance Use Topics  . Alcohol use: Never  . Drug use: Yes    Types: Marijuana    Home Medications Prior to Admission medications   Medication Sig Start Date End Date Taking? Authorizing Provider  amLODipine (NORVASC) 10 MG tablet Take 1 tablet (10 mg total) by mouth daily. 06/06/20   Marcine Matar, MD  atorvastatin (LIPITOR) 20 MG tablet Take 1 tablet (20 mg total) by mouth daily. 06/06/20   Marcine Matar, MD  busPIRone (BUSPAR) 30 MG tablet Take 1 tablet (30 mg total) by mouth 2 (two) times daily. 09/08/20   Shanna Cisco, NP  cyclobenzaprine (FLEXERIL) 5 MG tablet Take 1 tablet (5 mg total) by mouth 3 (three) times daily as needed for muscle spasms. Patient not taking: No sig reported 07/16/20   Linus Mako B, NP  DULoxetine (CYMBALTA) 60 MG capsule Take 2 capsules (120 mg total) by mouth daily. Patient taking differently: Take 60 mg by mouth 2 (two) times daily. 09/08/20   Shanna Cisco, NP  gabapentin (NEURONTIN) 300 MG capsule Take 300-600 mg by mouth See admin instructions. Takes 300 mg in the morning 300 mg in the afternoon and 600 mg at night 08/04/20   [provider]  HYDROmorphone (DILAUDID) 4 MG tablet Take 4 mg by mouth 4 (four) times daily as needed for severe pain. 09/22/20   [provider]  hydrOXYzine (ATARAX/VISTARIL)  50 MG tablet Take 1 tablet (50 mg total) by mouth 3 (three) times daily as needed for anxiety. 09/08/20   Shanna Cisco, NP  levothyroxine (SYNTHROID) 50 MCG tablet Take 1 tablet (50 mcg total) by mouth daily. 06/06/20   Marcine Matar, MD  lidocaine (LIDODERM) 5 % Place 1 patch onto the skin daily. Remove & Discard patch within 12 hours or as directed by MD 08/17/20   Caccavale, Sophia, PA-C  lisinopril (ZESTRIL) 20 MG tablet Take 1 tablet (20 mg total) by mouth daily. 06/06/20   Marcine Matar, MD  LORazepam (ATIVAN) 0.5 MG tablet Take 1 tablet (0.5 mg total) by  mouth daily as needed for anxiety. 09/08/20   Shanna Cisco, NP  meloxicam (MOBIC) 15 MG tablet Take 1 tablet (15 mg total) by mouth daily. Patient not taking: No sig reported 07/19/20   Renne Crigler, PA-C  Multiple Vitamin (MULTIVITAMIN WITH MINERALS) TABS tablet Take 1 tablet by mouth daily.    [provider]  promethazine (PHENERGAN) 12.5 MG tablet Take 12.5 mg by mouth every 6 (six) hours as needed for nausea or vomiting. 09/12/20   [provider]  SUMAtriptan (IMITREX) 100 MG tablet Take 100 mg by mouth daily as needed for migraine. May repeat in 2 hours if headache persists or recurs.    [provider]  ziprasidone (GEODON) 40 MG capsule Take 1 capsule (40 mg total) by mouth 2 (two) times daily with a meal. 09/08/20   Shanna Cisco, NP    Allergies    Morphine and related  Review of Systems   Review of Systems  All other systems reviewed and are negative.   Physical Exam Updated Vital Signs BP (!) 115/97 (BP Location: Right Arm)   Pulse (!) 115   Temp 98.7 F (37.1 C) (Oral)   Resp 17   Ht 5\' 10"  (1.778 m)   Wt 88 kg   SpO2 95%   BMI 27.84 kg/m   Physical Exam Vitals and nursing note reviewed.   50 year old male, somewhat anxious, but in no acute distress. Vital signs are significant for elevated heart rate and mildly elevated blood pressure. Oxygen saturation is 95%, which is normal. Head is normocephalic and atraumatic. PERRLA, EOMI. Oropharynx is clear. Neck: Anterior surgical scar present without signs of infection. Back is nontender and there is no CVA tenderness. Lungs are clear without rales, wheezes, or rhonchi. Chest is nontender. Heart has regular rate and rhythm without murmur. Abdomen is soft, flat, nontender without masses or hepatosplenomegaly and peristalsis is normoactive. Extremities have no cyanosis or edema, full range of motion is present. Skin is warm and dry without rash. Neurologic: Awake and alert, speech  is normal, cranial nerves are intact.  Strength is 5/5 in both arms and both legs.  No gross sensory deficit.  ED Results / Procedures / Treatments    Procedures Procedures   Medications Ordered in ED Medications  ondansetron (ZOFRAN) injection 4 mg (has no administration in time range)  HYDROmorphone (DILAUDID) injection 2 mg (has no administration in time range)  methocarbamol (ROBAXIN) 1,000 mg in dextrose 5 % 100 mL IVPB (has no administration in time range)  LORazepam (ATIVAN) injection 1 mg (has no administration in time range)    ED Course  I have reviewed the triage vital signs and the nursing notes.  MDM Rules/Calculators/A&P Continuing neck pain following neck surgery.  Old records are reviewed confirming recent ED visit for neck pain following  surgery.  Also, his record on the [place] controlled substance reporting website was reviewed confirming prescriptions for hydromorphone 4 mg to take 4 times a day.  He also had a prescription for oxycodone-acetaminophen 10-325 which patient states he is not taking.  I have advised the patient that I cannot make any significant changes to his medication regimen but will give him an injection of hydromorphone as well as methocarbamol and lorazepam to try to give him temporary relief.  He had reasonably good pain relief with above-noted treatment.  It is noted that he had stopped taking cyclobenzaprine and does not have a muscle relaxer.  He is given a prescription for methocarbamol and is referred back to his surgeon and his pain specialist.  Final Clinical Impression(s) / ED Diagnoses Final diagnoses:  Chronic neck pain with history of cervical spinal surgery    Rx / DC Orders ED Discharge Orders         Ordered    methocarbamol (ROBAXIN) 500 MG tablet  Every 6 hours PRN        09/30/20 0323           Dione Booze, MD 09/30/20 [phone]

## 2020-09-30 NOTE — ED Triage Notes (Signed)
Patient was here last night for same symptoms-states he ran out of his pain meds-states the pharmacy has not filled his weekly script-states he is in pain and its causing him anxiety

## 2020-09-30 NOTE — ED Notes (Signed)
Pt ambulating in hallway requesting for his mother to come back to his room

## 2020-09-30 NOTE — ED Provider Notes (Signed)
[facility]-EMERGENCY DEPT Provider Note   CSN: [id number] Arrival date & time: 09/30/20  1023     History Chief Complaint  Patient presents with  . Medication Refill    Samuel Ewing is a 50 y.o. male with a history of cervical radiculopathy (status post surgical fixation performed by Dr. Shon Baton 09/26/2020), anxiety, hypertension, migraine.  Patient presents today with chief complaint of cervical neck pain.  Patient reports that he has had cervical neck pain since his surgery 4 days prior.  Patient reports pain is unchanged over this time.  Reports radiation of pain with some numbness into his left shoulder which was present prior to his surgery.  Pain is constant.  Patient rates his pain 10/10 on the pain scale.  Pain is worse with movement.  Per chart review patient  has been seen in the emergency department twice since since his surgery on 4/1.  Most recently patient was seen yesterday for similar complaints of neck pain.  Patient was given Dilaudid, Robaxin, and Ativan with improvement in his symptoms.  Patient was given prescription for Ativan.  Patient reports that he has been in contact with his surgeon.  Engineer, civil (consulting) has him a prescription for Dilaudid however he has been unable to pick up this medication due to a problem with his insurance.    Patient endorses urinary retention and urinary incontinence.  States that he has had the urge to urinate but is unable to.  In other instances patient has been unable to control his urination with episodes of enuresis.  Patient reports that these symptoms began yesterday.  Patient has not contacted his surgeon about these symptoms.  Patient denies any fever, chills, weakness to extremities, numbness to right upper extremity or bilateral lower extremities, facial asymmetry, slurred speech, focal neurological deficit.  HPI     Past Medical History:  Diagnosis Date  . Anxiety   . Hypertension   . Migraine      Patient Active Problem List   Diagnosis Date Noted  . Mild episode of recurrent major depressive disorder (HCC) 06/09/2020  . Essential hypertension 06/06/2020  . Hyperlipidemia 06/06/2020  . Migraine with aura and without status migrainosus, not intractable 06/06/2020  . Generalized anxiety disorder 06/06/2020  . Hypothyroidism 06/06/2020  . 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 Moderate episode of recurrent major depressive disorder (HCC) 06/06/2020    History reviewed. No pertinent surgical history.     Family History  Problem Relation Age of Onset  . Heart failure Mother   . Heart failure Father   . Heart attack Father     Social History   Tobacco Use  . Smoking status: Never Smoker  . Smokeless tobacco: Never Used  Vaping Use  . Vaping Use: Every day  . Substances: Nicotine  Substance Use Topics  . Alcohol use: Never  . Drug use: Yes    Types: Marijuana    Home Medications Prior to Admission medications   Medication Sig Start Date End Date Taking? Authorizing Provider  amLODipine (NORVASC) 10 MG tablet Take 1 tablet (10 mg total) by mouth daily. 06/06/20   Marcine Matar, MD  atorvastatin (LIPITOR) 20 MG tablet Take 1 tablet (20 mg total) by mouth daily. 06/06/20   Marcine Matar, MD  busPIRone (BUSPAR) 30 MG tablet Take 1 tablet (30 mg total) by mouth 2 (two) times daily. 09/08/20   Shanna Cisco, NP  DULoxetine (CYMBALTA) 60 MG capsule Take 2 capsules (120 mg total) by  mouth daily. Patient taking differently: Take 60 mg by mouth 2 (two) times daily. 09/08/20   Shanna CiscoParsons, Brittney E, NP  gabapentin (NEURONTIN) 300 MG capsule Take 300-600 mg by mouth See admin instructions. Takes 300 mg in the morning 300 mg in the afternoon and 600 mg at night 08/04/20   [provider]  HYDROmorphone (DILAUDID) 4 MG tablet Take 4 mg by mouth 4 (four) times daily as needed for severe pain. 09/22/20   [provider]  hydrOXYzine (ATARAX/VISTARIL) 50 MG  tablet Take 1 tablet (50 mg total) by mouth 3 (three) times daily as needed for anxiety. 09/08/20   Shanna CiscoParsons, Brittney E, NP  levothyroxine (SYNTHROID) 50 MCG tablet Take 1 tablet (50 mcg total) by mouth daily. 06/06/20   Marcine MatarJohnson, Deborah B, MD  lidocaine (LIDODERM) 5 % Place 1 patch onto the skin daily. Remove & Discard patch within 12 hours or as directed by MD 08/17/20   Caccavale, Sophia, PA-C  lisinopril (ZESTRIL) 20 MG tablet Take 1 tablet (20 mg total) by mouth daily. 06/06/20   Marcine MatarJohnson, Deborah B, MD  LORazepam (ATIVAN) 0.5 MG tablet Take 1 tablet (0.5 mg total) by mouth daily as needed for anxiety. 09/08/20   Shanna CiscoParsons, Brittney E, NP  meloxicam (MOBIC) 15 MG tablet Take 1 tablet (15 mg total) by mouth daily. Patient not taking: No sig reported 07/19/20   Renne CriglerGeiple, Joshua, PA-C  methocarbamol (ROBAXIN) 500 MG tablet Take 1 tablet (500 mg total) by mouth every 6 (six) hours as needed for muscle spasms. 09/30/20   Dione BoozeGlick, David, MD  Multiple Vitamin (MULTIVITAMIN WITH MINERALS) TABS tablet Take 1 tablet by mouth daily.    [provider]  promethazine (PHENERGAN) 12.5 MG tablet Take 12.5 mg by mouth every 6 (six) hours as needed for nausea or vomiting. 09/12/20   [provider]  SUMAtriptan (IMITREX) 100 MG tablet Take 100 mg by mouth daily as needed for migraine. May repeat in 2 hours if headache persists or recurs.    [provider]  ziprasidone (GEODON) 40 MG capsule Take 1 capsule (40 mg total) by mouth 2 (two) times daily with a meal. 09/08/20   Shanna CiscoParsons, Brittney E, NP    Allergies    Morphine and related  Review of Systems   Review of Systems  Constitutional: Negative for chills and fever.  Eyes: Negative for visual disturbance.  Respiratory: Negative for shortness of breath.   Cardiovascular: Negative for chest pain.  Gastrointestinal: Negative for abdominal pain, nausea and vomiting.  Genitourinary: Positive for difficulty urinating and enuresis. Negative for  dysuria, frequency, hematuria, penile discharge, penile pain, penile swelling, scrotal swelling and testicular pain.  Musculoskeletal: Positive for neck pain. Negative for back pain, joint swelling and neck stiffness.  Skin: Negative for color change and rash.  Neurological: Positive for numbness. Negative for dizziness, tremors, seizures, syncope, speech difficulty, weakness, light-headedness and headaches.  Psychiatric/Behavioral: Negative for confusion.    Physical Exam Updated Vital Signs BP 122/89   Pulse 85   Temp 98 F (36.7 C) (Oral)   Resp 18   SpO2 97%   Physical Exam Vitals and nursing note reviewed.  Constitutional:      General: He is not in acute distress.    Appearance: He is not ill-appearing, toxic-appearing or diaphoretic.  HENT:     Head: Normocephalic and atraumatic.  Eyes:     General: No scleral icterus.       Right eye: No discharge.  Left eye: No discharge.     Extraocular Movements: Extraocular movements intact.     Pupils: Pupils are equal, round, and reactive to light.  Cardiovascular:     Rate and Rhythm: Normal rate.  Pulmonary:     Effort: Pulmonary effort is normal. No respiratory distress.     Breath sounds: No stridor.  Musculoskeletal:     Cervical back: Normal range of motion and neck supple. Tenderness and bony tenderness present. No swelling, edema, deformity, erythema, signs of trauma, lacerations, rigidity, spasms, torticollis or crepitus. Pain with movement present. Normal range of motion.     Thoracic back: No swelling, edema, deformity, signs of trauma, lacerations, spasms, tenderness or bony tenderness. Normal range of motion.     Lumbar back: No swelling, edema, deformity, signs of trauma, lacerations, spasms, tenderness or bony tenderness. Normal range of motion.     Comments: Surgical incision to anterior neck; wound is dry tact with out surrounding erythema, bleeding or discharge.  No deformity or step-off noted to cervical,  thoracic, or lumbar spine.  Patient has tenderness to cervical spine and bilateral paraspinous muscles.  Skin:    General: Skin is warm and dry.  Neurological:     General: No focal deficit present.     Mental Status: He is alert and oriented to person, place, and time.     GCS: GCS eye subscore is 4. GCS verbal subscore is 5. GCS motor subscore is 6.     Cranial Nerves: No cranial nerve deficit or facial asymmetry.     Sensory: Sensation is intact.     Motor: No weakness, tremor, seizure activity or pronator drift.     Coordination: Romberg sign negative. Finger-Nose-Finger Test normal.     Gait: Gait is intact. Gait normal.     Comments: CN II-XII intact, equal grip strength, +5 strength to bilateral upper and lower extremities     Psychiatric:        Behavior: Behavior is cooperative.     ED Results / Procedures / Treatments   Labs (all labs ordered are listed, but only abnormal results are displayed) Labs Reviewed  I-STAT CREATININE, ED    EKG None  Radiology MR Cervical Spine Wo Contrast  Result Date: 09/30/2020 CLINICAL DATA:  Cervical radiculopathy. Recent cervical spine surgery approximately 4 days prior. EXAM: MRI CERVICAL SPINE WITHOUT CONTRAST TECHNIQUE: Multiplanar, multisequence MR imaging of the cervical spine was performed. No intravenous contrast was administered. COMPARISON:  None. FINDINGS: Alignment: Normal alignment.  Straightening of the cervical lordosis Vertebrae: Normal bone marrow.  Negative for fracture or mass. Cord: Normal signal and morphology. Posterior Fossa, vertebral arteries, paraspinal tissues: Prevertebral edema bilaterally throughout the cervical spine. No associated bone marrow edema. Disc levels: C2-3: Negative C3-4: Small central disc protrusion mild facet degeneration and mild foraminal stenosis bilaterally. C4-5: Small central disc protrusion.  Negative for stenosis C5-6: Interbody metal prosthesis at C5-6. This is causing local artifact. No  significant stenosis C6-7: Interbody metal prosthesis at C6-7. This is causing local artifact. 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 Small central disc protrusion C4-5 Prevertebral soft tissue edema throughout the cervical spine bilaterally extending into the upper cervicothoracic spine, consistent with recent cervical spine surgery. Electronically Signed   By: Marlan Palau M.D.   On: 09/30/2020 19:22   MR THORACIC SPINE WO CONTRAST  Result Date: 09/30/2020 CLINICAL DATA:  Cervical spine surgery  4 days prior. New urinary incontinence. Rule out cord compression. EXAM: MRI THORACIC SPINE WITHOUT CONTRAST TECHNIQUE: Multiplanar, multisequence MR imaging of the thoracic spine was performed. No intravenous contrast was administered. COMPARISON:  None. FINDINGS: Alignment:  Normal Vertebrae: Normal bone marrow. Negative for fracture, mass, or infection. Cord:  Normal signal and morphology.  No cord compression. Paraspinal and other soft tissues: Prevertebral edema in the cervical spine extending into the upper thoracic spine. No loculated fluid. This is most likely related to recent cervical spine surgery. Disc levels: T1-2: Negative T2-3: Negative T3-4: Negative T4-5: Negative T5-6: Negative T6-7: Small right paracentral disc protrusion touching the cord. No cord deformity or stenosis T7-8: Mild to moderate left paracentral disc protrusion touching the cord with cord flattening. Bilateral facet degeneration. Negative for stenosis. T8-9: Bilateral mild facet degeneration.  Negative for stenosis T9-10: Small central disc protrusion. Bilateral facet degeneration. Negative for stenosis. T10-11: Bilateral facet degeneration.  Negative for stenosis T11-12: Mild facet degeneration.  Negative for stenosis T12-L1: Negative IMPRESSION: Negative for cord compression Multilevel  degenerative change in the thoracic spine Prevertebral soft tissue swelling in the cervical and upper thoracic spine consistent with recent cervical spine surgery. Electronically Signed   By: Marlan Palau M.D.   On: 09/30/2020 19:28   MR LUMBAR SPINE WO CONTRAST  Result Date: 09/30/2020 CLINICAL DATA:  Cervical spine surgery 4 days ago. Urinary incontinence. Rule out cauda equina syndrome. EXAM: MRI LUMBAR SPINE WITHOUT CONTRAST TECHNIQUE: Multiplanar, multisequence MR imaging of the lumbar spine was performed. No intravenous contrast was administered. COMPARISON:  None. FINDINGS: Segmentation:  Normal Alignment:  Normal Vertebrae: Normal bone marrow. Negative for fracture, mass, or infection Conus medullaris and cauda equina: Conus extends to the L1-2 level. Conus and cauda equina appear normal. Paraspinal and other soft tissues: Negative for paraspinous mass, adenopathy, fluid collection Disc levels: Normal disc spaces.  No degenerative change or neural impingement. IMPRESSION: Normal MRI lumbar spine. Electronically Signed   By: Marlan Palau M.D.   On: 09/30/2020 19:38    Procedures Procedures   Medications Ordered in ED Medications  HYDROmorphone (DILAUDID) injection 1 mg (1 mg Intravenous Given 09/30/20 1558)  HYDROmorphone (DILAUDID) injection 1 mg (1 mg Intravenous Given 09/30/20 1938)    ED Course  I have reviewed the triage vital signs and the nursing notes.  Pertinent labs & imaging results that were available during my care of the patient were reviewed by me and considered in my medical decision making (see chart for details).    MDM Rules/Calculators/A&P                          Alert 50 year old male no acute distress, nontoxic-appearing.  Patient presents with chief complaint of cervical neck pain.  Patient has history of cervical radiculopathy and had surgery performed by Dr. Shon Baton on 09/26/2020.  Patient has had continued pain since his surgery with numbness to left shoulder  which was present prior to surgery.  During interview patient reports that he has had urinary retention and enuresis that began yesterday.  Patient reports he has not spoken to his surgeon about this.  Patient denies any fever, chills, weakness to extremities, numbness to right upper extremity or bilateral lower extremities, facial asymmetry, slurred speech, focal neurological deficit.  On physical exam has focal neurological deficits.  No deformity noted to cervical, thoracic or lumbar spine.  Patient has no decreased range of motion, erythema, edema, swelling, trauma or rigidity to cervical  spine.  Bladder scan shows 95mL.  Due to complaints of urinary retention and enuresis will reach out to on-call physician for [facility]. 1621 spoke to PA [place] who reports that Dr. Shon Baton advised to obtain MRI of cervical, thoracic, and lumbar spine due to patient's reports of urinary retention and enuresis.  If scans are unremarkable patient is to follow-up in clinic on Friday.  During hospital stay patient was given Dilaudid for pain management.  He reports that while in the emergency department was contacted that his insurance problem has been resolved and he will be able to pick up his prescribed medications.    MRI of cervical spine showed disc prosthesis at C5-C6 and C6-C7 without significant stenosis. Central disc protrusion C3-C4 with mild foraminal narrowing bilaterally.  Small central disc protrusion C4-5.  Prevertebral soft tissue edema throughout the cervical spine bilaterally extending into the upper cervicothoracic spine; consistent with recent cervical spine surgery.  MRI of thoracic spine showed no cord compression, multilevel degenerative change in thoracic spine, prevertebral soft tissue swelling in the cervical and upper thoracic spine consistent with recent cervical spine surgery.  MRI of lumbar spine showed normal lumbar spine.  On repeat examination patient continues to have no focal  neurological deficit.  Is in no acute distress.  Is hemodynamically stable.  Discussed results, findings, treatment and follow up. Patient advised of return precautions. Patient verbalized understanding and agreed with plan.   Final Clinical Impression(s) / ED Diagnoses Final diagnoses:  Neck pain    Rx / DC Orders ED Discharge Orders    None       Berneice Heinrich 10/01/20 1572    Tilden Fossa, MD 10/04/20 [phone]

## 2020-10-05 ENCOUNTER — Emergency Department (HOSPITAL_BASED_OUTPATIENT_CLINIC_OR_DEPARTMENT_OTHER)
Admission: EM | Admit: 2020-10-05 | Discharge: 2020-10-05 | Disposition: A | Discharge status: Home / Self Care | Payer: 59 | Source: Home / Self Care | Attending: Emergency Medicine | Admitting: Emergency Medicine

## 2020-10-05 ENCOUNTER — Other Ambulatory Visit: Payer: Self-pay

## 2020-10-05 ENCOUNTER — Encounter (HOSPITAL_BASED_OUTPATIENT_CLINIC_OR_DEPARTMENT_OTHER): Payer: Self-pay | Admitting: Emergency Medicine

## 2020-10-05 ENCOUNTER — Emergency Department (HOSPITAL_COMMUNITY)
Admission: EM | Admit: 2020-10-05 | Discharge: 2020-10-05 | Disposition: A | Discharge status: Home / Self Care | Payer: 59 | Attending: Emergency Medicine | Admitting: Emergency Medicine

## 2020-10-05 ENCOUNTER — Encounter (HOSPITAL_COMMUNITY): Payer: Self-pay

## 2020-10-05 DIAGNOSIS — F419 Anxiety disorder, unspecified: Secondary | ICD-10-CM | POA: Diagnosis present

## 2020-10-05 DIAGNOSIS — F411 Generalized anxiety disorder: Secondary | ICD-10-CM | POA: Insufficient documentation

## 2020-10-05 DIAGNOSIS — Z79899 Other long term (current) drug therapy: Secondary | ICD-10-CM | POA: Insufficient documentation

## 2020-10-05 DIAGNOSIS — E039 Hypothyroidism, unspecified: Secondary | ICD-10-CM | POA: Insufficient documentation

## 2020-10-05 DIAGNOSIS — I10 Essential (primary) hypertension: Secondary | ICD-10-CM | POA: Insufficient documentation

## 2020-10-05 DIAGNOSIS — R000 Tachycardia, unspecified: Secondary | ICD-10-CM | POA: Insufficient documentation

## 2020-10-05 MED ORDER — LORAZEPAM 1 MG PO TABS
1.0000 mg | ORAL_TABLET | Freq: Once | ORAL | Status: AC
  Administered 2020-10-05: 1 mg via ORAL
  Filled 2020-10-05: qty 1

## 2020-10-05 MED ORDER — DIAZEPAM 5 MG PO TABS
5.0000 mg | ORAL_TABLET | Freq: Once | ORAL | Status: AC
  Administered 2020-10-05: 5 mg via ORAL
  Filled 2020-10-05: qty 1

## 2020-10-05 NOTE — ED Triage Notes (Signed)
Patient complaining of anxiety. Patient does not have a plan to hurt himself.

## 2020-10-05 NOTE — ED Provider Notes (Signed)
[facility] EMERGENCY DEPARTMENT Provider Note   CSN: [id number] Arrival date & time: 10/05/20  1007     History No chief complaint on file.   Samuel Ewing is a 50 y.o. male with pertinent past medical history of major depression, generalized anxiety disorder that presents emerge department today for anxiety with his mom.  Patient states that anxiety his whole life, was seen earlier at [facility] ED at 3:40 AM.  States that he started having anxiety at that time, came in, they gave him some Ativan, patient states that his anxiety do not fully go away.  Patient states that he takes p.o. Ativan at least 3-4 times a month, states that he feels as if he is becoming resistant to Ativan and wants something else.  Sees a psychiatrist 1 time every 3 months.  Patient denies any recent triggers.  Denies any chest pain nausea vomiting, dizziness, headache, fevers, abdominal pain, vision changes, neck pain, confusion.  Denies any palpitations or shortness of breath.  States that he is currently asymptomatic, just feels anxious and feels as if he is crawling out of his skin.  Denies any substance use.  States that he just wants a medication to help with his anxiety currently.  Feels as if he is having a panic attack.  No other complaints.  No SI or HI. HPI     Past Medical History:  Diagnosis Date  . Anxiety   . Hypertension   . Migraine     Patient Active Problem List   Diagnosis Date Noted  . Mild episode of recurrent major depressive disorder (HCC) 06/09/2020  . Essential hypertension 06/06/2020  . Hyperlipidemia 06/06/2020  . Migraine with aura and without status migrainosus, not intractable 06/06/2020  . Generalized anxiety disorder 06/06/2020  . Hypothyroidism 06/06/2020  . 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 Moderate episode of recurrent major depressive disorder (HCC) 06/06/2020    Past Surgical History:  Procedure Laterality Date  . BACK SURGERY         Family  History  Problem Relation Age of Onset  . Heart failure Mother   . Heart failure Father   . Heart attack Father     Social History   Tobacco Use  . Smoking status: Never Smoker  . Smokeless tobacco: Never Used  Vaping Use  . Vaping Use: Every day  . Substances: Nicotine  Substance Use Topics  . Alcohol use: Never  . Drug use: Yes    Types: Marijuana    Home Medications Prior to Admission medications   Medication Sig Start Date End Date Taking? Authorizing Provider  amLODipine (NORVASC) 10 MG tablet Take 1 tablet (10 mg total) by mouth daily. 06/06/20   Marcine Matar, MD  atorvastatin (LIPITOR) 20 MG tablet Take 1 tablet (20 mg total) by mouth daily. 06/06/20   Marcine Matar, MD  busPIRone (BUSPAR) 30 MG tablet Take 1 tablet (30 mg total) by mouth 2 (two) times daily. 09/08/20   Shanna Cisco, NP  DULoxetine (CYMBALTA) 60 MG capsule Take 2 capsules (120 mg total) by mouth daily. Patient taking differently: Take 60 mg by mouth 2 (two) times daily. 09/08/20   Shanna Cisco, NP  gabapentin (NEURONTIN) 300 MG capsule Take 300-600 mg by mouth See admin instructions. Takes 300 mg in the morning 300 mg in the afternoon and 600 mg at night 08/04/20   [provider]  HYDROmorphone (DILAUDID) 4 MG tablet Take 4 mg by mouth 4 (four)  times daily as needed for severe pain. 09/22/20   [provider]  hydrOXYzine (ATARAX/VISTARIL) 50 MG tablet Take 1 tablet (50 mg total) by mouth 3 (three) times daily as needed for anxiety. 09/08/20   Shanna Cisco, NP  levothyroxine (SYNTHROID) 50 MCG tablet Take 1 tablet (50 mcg total) by mouth daily. 06/06/20   Marcine Matar, MD  lidocaine (LIDODERM) 5 % Place 1 patch onto the skin daily. Remove & Discard patch within 12 hours or as directed by MD 08/17/20   Caccavale, Sophia, PA-C  lisinopril (ZESTRIL) 20 MG tablet Take 1 tablet (20 mg total) by mouth daily. 06/06/20   Marcine Matar, MD  LORazepam (ATIVAN) 0.5  MG tablet Take 1 tablet (0.5 mg total) by mouth daily as needed for anxiety. 09/08/20   Shanna Cisco, NP  meloxicam (MOBIC) 15 MG tablet Take 1 tablet (15 mg total) by mouth daily. Patient not taking: No sig reported 07/19/20   Renne Crigler, PA-C  methocarbamol (ROBAXIN) 500 MG tablet Take 1 tablet (500 mg total) by mouth every 6 (six) hours as needed for muscle spasms. 09/30/20   Dione Booze, MD  Multiple Vitamin (MULTIVITAMIN WITH MINERALS) TABS tablet Take 1 tablet by mouth daily.    [provider]  promethazine (PHENERGAN) 12.5 MG tablet Take 12.5 mg by mouth every 6 (six) hours as needed for nausea or vomiting. 09/12/20   [provider]  SUMAtriptan (IMITREX) 100 MG tablet Take 100 mg by mouth daily as needed for migraine. May repeat in 2 hours if headache persists or recurs.    [provider]  ziprasidone (GEODON) 40 MG capsule Take 1 capsule (40 mg total) by mouth 2 (two) times daily with a meal. 09/08/20   Shanna Cisco, NP    Allergies    Morphine and related  Review of Systems   Review of Systems  Constitutional: Negative for chills, diaphoresis, fatigue and fever.  HENT: Negative for congestion, sore throat and trouble swallowing.   Eyes: Negative for pain and visual disturbance.  Respiratory: Negative for cough, shortness of breath and wheezing.   Cardiovascular: Negative for chest pain, palpitations and leg swelling.  Gastrointestinal: Negative for abdominal distention, abdominal pain, diarrhea, nausea and vomiting.  Genitourinary: Negative for difficulty urinating.  Musculoskeletal: Negative for back pain, neck pain and neck stiffness.  Skin: Negative for pallor.  Neurological: Negative for dizziness, speech difficulty, weakness and headaches.  Psychiatric/Behavioral: Negative for confusion and suicidal ideas. The patient is nervous/anxious.     Physical Exam Updated Vital Signs BP 106/83   Pulse 95   Temp 98.1 F (36.7 C) (Oral)    Resp 20   Ht 5\' 10"  (1.778 m)   Wt 86.2 kg   SpO2 94%   BMI 27.26 kg/m   Physical Exam Constitutional:      General: He is not in acute distress.    Appearance: Normal appearance. He is not ill-appearing, toxic-appearing or diaphoretic.  HENT:     Mouth/Throat:     Mouth: Mucous membranes are moist.     Pharynx: Oropharynx is clear.  Eyes:     General: No scleral icterus.    Extraocular Movements: Extraocular movements intact.     Pupils: Pupils are equal, round, and reactive to light.  Cardiovascular:     Rate and Rhythm: Normal rate and regular rhythm.     Pulses: Normal pulses.     Heart sounds: Normal heart sounds.  Pulmonary:  Effort: Pulmonary effort is normal. No respiratory distress.     Breath sounds: Normal breath sounds. No stridor. No wheezing, rhonchi or rales.  Chest:     Chest wall: No tenderness.  Abdominal:     General: Abdomen is flat. There is no distension.     Palpations: Abdomen is soft.     Tenderness: There is no abdominal tenderness. There is no guarding or rebound.  Musculoskeletal:        General: No swelling or tenderness. Normal range of motion.     Cervical back: Normal range of motion and neck supple. No rigidity.     Right lower leg: No edema.     Left lower leg: No edema.  Skin:    General: Skin is warm and dry.     Capillary Refill: Capillary refill takes less than 2 seconds.     Coloration: Skin is not pale.  Neurological:     General: No focal deficit present.     Mental Status: He is alert and oriented to person, place, and time. Mental status is at baseline.     Cranial Nerves: No cranial nerve deficit.     Sensory: No sensory deficit.     Motor: No weakness.     Gait: Gait normal.  Psychiatric:        Mood and Affect: Mood normal.        Behavior: Behavior normal.     ED Results / Procedures / Treatments   Labs (all labs ordered are listed, but only abnormal results are displayed) Labs Reviewed - No data to  display  EKG None  Radiology No results found.  Procedures Procedures   Medications Ordered in ED Medications  diazepam (VALIUM) tablet 5 mg (5 mg Oral Given 10/05/20 1128)    ED Course  I have reviewed the triage vital signs and the nursing notes.  Pertinent labs & imaging results that were available during my care of the patient were reviewed by me and considered in my medical decision making (see chart for details).    MDM Rules/Calculators/A&P                          Samuel Ewing is a 50 y.o. male with pertinent past medical history of major depression, generalized anxiety disorder that presents emerge department today for anxiety.  Patient is asymptomatic, slightly tachycardic.  Patient does not want any Ativan.Will try Valium, more longer acting and reevaluate.   An hour after evaluation, patient states that he feels much better, no longer anxious.  Tachycardia resolved.  Mom and patient ready for discharge.  Resources given, patient will follow up with psychiatrist and see PCP.  Doubt need for further emergent work up at this time. I explained the diagnosis and have given explicit precautions to return to the ER including for any other new or worsening symptoms. The patient understands and accepts the medical plan as it's been dictated and I have answered their questions. Discharge instructions concerning home care and prescriptions have been given. The patient is STABLE and is discharged to home in good condition.    Final Clinical Impression(s) / ED Diagnoses Final diagnoses:  Anxiety    Rx / DC Orders ED Discharge Orders    None       Farrel Gordon, PA-C 10/05/20 1247    Benjiman Core, MD 10/05/20 [phone]

## 2020-10-05 NOTE — ED Triage Notes (Signed)
Patient complaining of anxiety that impedes sleep, unable to ride in a car without panic. Does not believe his prescription Ativan is working any longer.

## 2020-10-05 NOTE — ED Provider Notes (Signed)
[facility]-EMERGENCY DEPT Provider Note   CSN: [id number] Arrival date & time: 10/05/20  4709     History Chief Complaint  Patient presents with  . Anxiety    Samuel Ewing is a 50 y.o. male.   Anxiety This is a chronic problem. The current episode started 3 to 5 hours ago. The problem occurs constantly. The problem has not changed since onset.Pertinent negatives include no chest pain, no abdominal pain and no headaches. Nothing aggravates the symptoms. Nothing relieves the symptoms. He has tried nothing for the symptoms.       Past Medical History:  Diagnosis Date  . Anxiety   . Hypertension   . Migraine     Patient Active Problem List   Diagnosis Date Noted  . Mild episode of recurrent major depressive disorder (HCC) 06/09/2020  . Essential hypertension 06/06/2020  . Hyperlipidemia 06/06/2020  . Migraine with aura and without status migrainosus, not intractable 06/06/2020  . Generalized anxiety disorder 06/06/2020  . Hypothyroidism 06/06/2020  . 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 Moderate episode of recurrent major depressive disorder (HCC) 06/06/2020    History reviewed. No pertinent surgical history.     Family History  Problem Relation Age of Onset  . Heart failure Mother   . Heart failure Father   . Heart attack Father     Social History   Tobacco Use  . Smoking status: Never Smoker  . Smokeless tobacco: Never Used  Vaping Use  . Vaping Use: Every day  . Substances: Nicotine  Substance Use Topics  . Alcohol use: Never  . Drug use: Yes    Types: Marijuana    Home Medications Prior to Admission medications   Medication Sig Start Date End Date Taking? Authorizing Provider  amLODipine (NORVASC) 10 MG tablet Take 1 tablet (10 mg total) by mouth daily. 06/06/20   Marcine Matar, MD  atorvastatin (LIPITOR) 20 MG tablet Take 1 tablet (20 mg total) by mouth daily. 06/06/20   Marcine Matar, MD  busPIRone (BUSPAR) 30 MG  tablet Take 1 tablet (30 mg total) by mouth 2 (two) times daily. 09/08/20   Shanna Cisco, NP  DULoxetine (CYMBALTA) 60 MG capsule Take 2 capsules (120 mg total) by mouth daily. Patient taking differently: Take 60 mg by mouth 2 (two) times daily. 09/08/20   Shanna Cisco, NP  gabapentin (NEURONTIN) 300 MG capsule Take 300-600 mg by mouth See admin instructions. Takes 300 mg in the morning 300 mg in the afternoon and 600 mg at night 08/04/20   [provider]  HYDROmorphone (DILAUDID) 4 MG tablet Take 4 mg by mouth 4 (four) times daily as needed for severe pain. 09/22/20   [provider]  hydrOXYzine (ATARAX/VISTARIL) 50 MG tablet Take 1 tablet (50 mg total) by mouth 3 (three) times daily as needed for anxiety. 09/08/20   Shanna Cisco, NP  levothyroxine (SYNTHROID) 50 MCG tablet Take 1 tablet (50 mcg total) by mouth daily. 06/06/20   Marcine Matar, MD  lidocaine (LIDODERM) 5 % Place 1 patch onto the skin daily. Remove & Discard patch within 12 hours or as directed by MD 08/17/20   Caccavale, Sophia, PA-C  lisinopril (ZESTRIL) 20 MG tablet Take 1 tablet (20 mg total) by mouth daily. 06/06/20   Marcine Matar, MD  LORazepam (ATIVAN) 0.5 MG tablet Take 1 tablet (0.5 mg total) by mouth daily as needed for anxiety. 09/08/20   Shanna Cisco, NP  meloxicam (  MOBIC) 15 MG tablet Take 1 tablet (15 mg total) by mouth daily. Patient not taking: No sig reported 07/19/20   Renne Crigler, PA-C  methocarbamol (ROBAXIN) 500 MG tablet Take 1 tablet (500 mg total) by mouth every 6 (six) hours as needed for muscle spasms. 09/30/20   Dione Booze, MD  Multiple Vitamin (MULTIVITAMIN WITH MINERALS) TABS tablet Take 1 tablet by mouth daily.    [provider]  promethazine (PHENERGAN) 12.5 MG tablet Take 12.5 mg by mouth every 6 (six) hours as needed for nausea or vomiting. 09/12/20   [provider]  SUMAtriptan (IMITREX) 100 MG tablet Take 100 mg by mouth daily as  needed for migraine. May repeat in 2 hours if headache persists or recurs.    [provider]  ziprasidone (GEODON) 40 MG capsule Take 1 capsule (40 mg total) by mouth 2 (two) times daily with a meal. 09/08/20   Shanna Cisco, NP    Allergies    Morphine and related  Review of Systems   Review of Systems  Cardiovascular: Negative for chest pain.  Gastrointestinal: Negative for abdominal pain.  Neurological: Negative for headaches.  All other systems reviewed and are negative.   Physical Exam Updated Vital Signs BP (!) 135/93 (BP Location: Left Arm)   Pulse 75   Temp 98.7 F (37.1 C) (Oral)   Resp 15   Ht 5\' 10"  (1.778 m)   Wt 88 kg   SpO2 96%   BMI 27.84 kg/m   Physical Exam Vitals and nursing note reviewed.  Constitutional:      Appearance: He is well-developed.  HENT:     Head: Normocephalic and atraumatic.     Mouth/Throat:     Mouth: Mucous membranes are moist.     Pharynx: Oropharynx is clear.  Eyes:     Pupils: Pupils are equal, round, and reactive to light.  Cardiovascular:     Rate and Rhythm: Normal rate.  Pulmonary:     Effort: Pulmonary effort is normal. No respiratory distress.  Abdominal:     General: There is no distension.  Musculoskeletal:        General: Normal range of motion.     Cervical back: Normal range of motion.  Skin:    General: Skin is warm and dry.  Neurological:     General: No focal deficit present.     Mental Status: He is alert.     ED Results / Procedures / Treatments   Labs (all labs ordered are listed, but only abnormal results are displayed) Labs Reviewed - No data to display  EKG None  Radiology No results found.  Procedures Procedures   Medications Ordered in ED Medications  LORazepam (ATIVAN) tablet 1 mg (1 mg Oral Given 10/05/20 12/05/20)    ED Course  I have reviewed the triage vital signs and the nursing notes.  Pertinent labs & imaging results that were available during my care of the  patient were reviewed by me and considered in my medical decision making (see chart for details).    MDM Rules/Calculators/A&P                          Anxiety. No SI/HI. H/o same. Home med provided. No further workup indicated. Stable for discharge.   Final Clinical Impression(s) / ED Diagnoses Final diagnoses:  Anxiety    Rx / DC Orders ED Discharge Orders    None  Syriah Delisi, Barbara Cower, MD 10/05/20 [phone]

## 2020-10-05 NOTE — Discharge Instructions (Addendum)
Please follow-up with your psychiatrist, use the attached instructions and resources.  I also provided you with the behavioral health urgent care resource which you can use. Please do not drive today since you were given benzos here in the emergency department today.  Please come back to the emerge department for any new worsening concerning symptoms.

## 2020-10-06 ENCOUNTER — Other Ambulatory Visit: Payer: Self-pay

## 2020-10-06 ENCOUNTER — Telehealth (HOSPITAL_COMMUNITY): Payer: Self-pay | Admitting: *Deleted

## 2020-10-06 ENCOUNTER — Other Ambulatory Visit (HOSPITAL_COMMUNITY): Payer: Self-pay | Admitting: Psychiatry

## 2020-10-06 DIAGNOSIS — E039 Hypothyroidism, unspecified: Secondary | ICD-10-CM

## 2020-10-06 DIAGNOSIS — I10 Essential (primary) hypertension: Secondary | ICD-10-CM

## 2020-10-06 DIAGNOSIS — F411 Generalized anxiety disorder: Secondary | ICD-10-CM

## 2020-10-06 MED ORDER — ATORVASTATIN CALCIUM 20 MG PO TABS: 20.0000 mg | ORAL_TABLET | Freq: Every day | ORAL | 0 refills | Status: DC

## 2020-10-06 MED ORDER — LEVOTHYROXINE SODIUM 50 MCG PO TABS: 50.0000 ug | ORAL_TABLET | Freq: Every day | ORAL | 0 refills | Status: DC

## 2020-10-06 MED ORDER — LORAZEPAM 0.5 MG PO TABS: 0.5000 mg | ORAL_TABLET | Freq: Every day | ORAL | 0 refills | Status: DC | PRN

## 2020-10-06 MED ORDER — AMLODIPINE BESYLATE 10 MG PO TABS: 10.0000 mg | ORAL_TABLET | Freq: Every day | ORAL | 0 refills | Status: DC

## 2020-10-06 MED ORDER — LISINOPRIL 20 MG PO TABS: 20.0000 mg | ORAL_TABLET | Freq: Every day | ORAL | 0 refills | Status: DC

## 2020-10-06 NOTE — Telephone Encounter (Signed)
Patient called and wanted a refill of Ativan sent to the [facility] on [address].

## 2020-10-06 NOTE — Telephone Encounter (Signed)
Copied from CRM [id number]. Topic: Quick Communication - Rx Refill/Question >> Oct 06, 2020 10:05 AM Louie Bun, Rosey Bath D wrote: Medication:lisinopril (ZESTRIL) 20 MG tablet, amLODipine (NORVASC) 10 MG tablet,  atorvastatin (LIPITOR) 20 MG tablet, levothyroxine (SYNTHROID) 50 MCG tablet  Has the patient contacted their pharmacy? Yes.   (Agent: If no, request that the patient contact the pharmacy for the refill.) (Agent: If yes, when and what did the pharmacy advise?)  Preferred Pharmacy (with phone number or street name): [facility] [address], [place] - [address]  Agent: Please be advised that RX refills may take up to 3 business days. We ask that you follow-up with your pharmacy.

## 2020-10-06 NOTE — Telephone Encounter (Signed)
Patient prescription for ordered and sent to [facility] on [address].  He is not due to pick the prescription up until 10/09/2020.  Pharmacy may not release medication until it is time to be refilled.

## 2020-10-17 ENCOUNTER — Emergency Department (HOSPITAL_BASED_OUTPATIENT_CLINIC_OR_DEPARTMENT_OTHER)
Admission: EM | Admit: 2020-10-17 | Discharge: 2020-10-17 | Disposition: A | Discharge status: Home / Self Care | Payer: 59 | Source: Home / Self Care | Attending: Emergency Medicine | Admitting: Emergency Medicine

## 2020-10-17 ENCOUNTER — Emergency Department (HOSPITAL_BASED_OUTPATIENT_CLINIC_OR_DEPARTMENT_OTHER): Payer: 59

## 2020-10-17 ENCOUNTER — Other Ambulatory Visit: Payer: Self-pay

## 2020-10-17 ENCOUNTER — Encounter (HOSPITAL_BASED_OUTPATIENT_CLINIC_OR_DEPARTMENT_OTHER): Payer: Self-pay | Admitting: Emergency Medicine

## 2020-10-17 DIAGNOSIS — R079 Chest pain, unspecified: Secondary | ICD-10-CM

## 2020-10-17 DIAGNOSIS — Z20822 Contact with and (suspected) exposure to covid-19: Secondary | ICD-10-CM | POA: Insufficient documentation

## 2020-10-17 DIAGNOSIS — E039 Hypothyroidism, unspecified: Secondary | ICD-10-CM | POA: Insufficient documentation

## 2020-10-17 DIAGNOSIS — F410 Panic disorder [episodic paroxysmal anxiety] without agoraphobia: Secondary | ICD-10-CM | POA: Insufficient documentation

## 2020-10-17 DIAGNOSIS — Z79899 Other long term (current) drug therapy: Secondary | ICD-10-CM | POA: Insufficient documentation

## 2020-10-17 DIAGNOSIS — F419 Anxiety disorder, unspecified: Secondary | ICD-10-CM

## 2020-10-17 DIAGNOSIS — I10 Essential (primary) hypertension: Secondary | ICD-10-CM | POA: Insufficient documentation

## 2020-10-17 LAB — URINALYSIS, ROUTINE W REFLEX MICROSCOPIC
Bilirubin Urine: NEGATIVE
Glucose, UA: NEGATIVE mg/dL
Ketones, ur: 40 mg/dL — AB
Leukocytes,Ua: NEGATIVE
Nitrite: NEGATIVE
Protein, ur: 30 mg/dL — AB
Specific Gravity, Urine: >1.03 — ABNORMAL HIGH (ref 1.005–1.030)
pH: 6 (ref 5.0–8.0)

## 2020-10-17 LAB — URINALYSIS, MICROSCOPIC (REFLEX)

## 2020-10-17 LAB — CBC WITH DIFFERENTIAL/PLATELET
Abs Immature Granulocytes: 0.04 10*3/uL (ref 0.00–0.07)
Basophils Absolute: 0 10*3/uL (ref 0.0–0.1)
Basophils Relative: 0 %
Eosinophils Absolute: 0 10*3/uL (ref 0.0–0.5)
Eosinophils Relative: 0 %
HCT: 33.7 % — ABNORMAL LOW (ref 39.0–52.0)
Hemoglobin: 11.5 g/dL — ABNORMAL LOW (ref 13.0–17.0)
Immature Granulocytes: 0 %
Lymphocytes Relative: 10 %
Lymphs Abs: 1.1 10*3/uL (ref 0.7–4.0)
MCH: 31.9 pg (ref 26.0–34.0)
MCHC: 34.1 g/dL (ref 30.0–36.0)
MCV: 93.6 fL (ref 80.0–100.0)
Monocytes Absolute: 0.6 10*3/uL (ref 0.1–1.0)
Monocytes Relative: 6 %
Neutro Abs: 9.2 10*3/uL — ABNORMAL HIGH (ref 1.7–7.7)
Neutrophils Relative %: 84 %
Platelets: 217 10*3/uL (ref 150–400)
RBC: 3.6 MIL/uL — ABNORMAL LOW (ref 4.22–5.81)
RDW: 11.9 % (ref 11.5–15.5)
WBC: 11 10*3/uL — ABNORMAL HIGH (ref 4.0–10.5)
nRBC: 0 % (ref 0.0–0.2)

## 2020-10-17 LAB — BASIC METABOLIC PANEL
Anion gap: 11 (ref 5–15)
BUN: 16 mg/dL (ref 6–20)
CO2: 22 mmol/L (ref 22–32)
Calcium: 8.9 mg/dL (ref 8.9–10.3)
Chloride: 100 mmol/L (ref 98–111)
Creatinine, Ser: 0.86 mg/dL (ref 0.61–1.24)
GFR, Estimated: >60 mL/min (ref >60)
Glucose, Bld: 148 mg/dL — ABNORMAL HIGH (ref 70–99)
Potassium: 4 mmol/L (ref 3.5–5.1)
Sodium: 133 mmol/L — ABNORMAL LOW (ref 135–145)

## 2020-10-17 LAB — SARS CORONAVIRUS 2 (TAT 6-24 HRS): SARS Coronavirus 2: NEGATIVE

## 2020-10-17 IMAGING — DX DG CHEST 1V PORT
1 series · 1 of 1 positions shown · non-contrast
Comparison: [DATE]

CLINICAL DATA: Shortness of breath and anxiety

EXAM:
PORTABLE CHEST 1 VIEW

[chest ap]
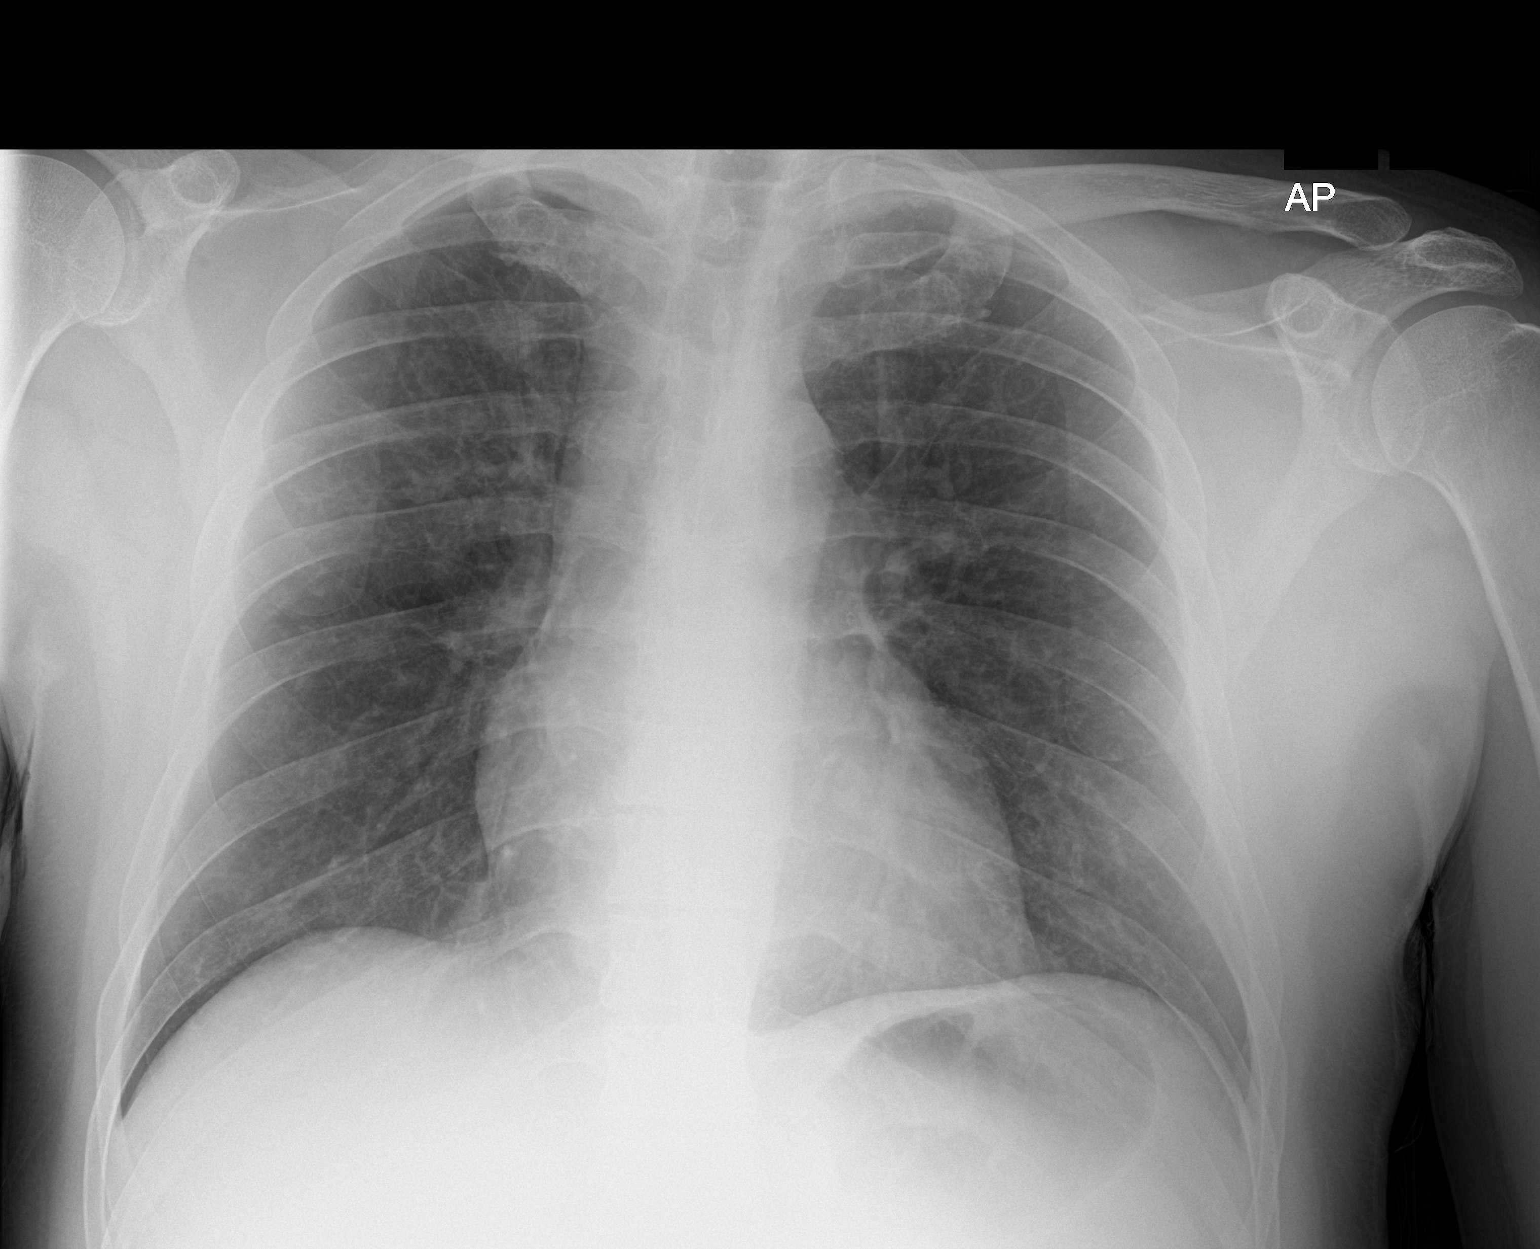

[1 of 1 positions shown; findings below may reference images not displayed]

FINDINGS: Normal heart size and mediastinal contours. Vague nodular density at
the right apex is attributed to overlap of the first and fourth
ribs. No mass in this area seen by recent MRI. No acute infiltrate
or edema. No effusion or pneumothorax. No acute osseous findings.
IMPRESSION: No evidence of active disease.

## 2020-10-17 MED ORDER — HYDROXYZINE HCL 25 MG PO TABS
25.0000 mg | ORAL_TABLET | Freq: Once | ORAL | Status: AC
  Administered 2020-10-17: 25 mg via ORAL
  Filled 2020-10-17: qty 1

## 2020-10-17 MED ORDER — DIAZEPAM 5 MG PO TABS
5.0000 mg | ORAL_TABLET | Freq: Once | ORAL | Status: AC
  Administered 2020-10-17: 5 mg via ORAL
  Filled 2020-10-17: qty 1

## 2020-10-17 NOTE — ED Triage Notes (Signed)
Pt states he thinks he is having a panic attack  Pt states this has been going on all day  Pt states he feels short of breath   Pt states he feels dehydrated and is not putting out much urine but has been drinking plenty of fluid  Pt denies dysuria

## 2020-10-17 NOTE — ED Notes (Signed)
EKG given to Mesner, Jason MD. 

## 2020-10-17 NOTE — ED Provider Notes (Signed)
[facility] EMERGENCY DEPARTMENT Provider Note   CSN: [id number] Arrival date & time: 10/17/20  0259     History Chief Complaint  Patient presents with  . Panic Attack    Samuel Ewing is a 50 y.o. male.   Anxiety This is a chronic problem. The current episode started 6 to 12 hours ago. The problem occurs constantly. The problem has not changed since onset.Associated symptoms include chest pain. Pertinent negatives include no abdominal pain, no headaches and no shortness of breath. Nothing aggravates the symptoms. Nothing relieves the symptoms. He has tried nothing for the symptoms. The treatment provided no relief.       Past Medical History:  Diagnosis Date  . Anxiety   . Hypertension   . Migraine     Patient Active Problem List   Diagnosis Date Noted  . Mild episode of recurrent major depressive disorder (HCC) 06/09/2020  . Essential hypertension 06/06/2020  . Hyperlipidemia 06/06/2020  . Migraine with aura and without status migrainosus, not intractable 06/06/2020  . Generalized anxiety disorder 06/06/2020  . Hypothyroidism 06/06/2020  . 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 Moderate episode of recurrent major depressive disorder (HCC) 06/06/2020    Past Surgical History:  Procedure Laterality Date  . BACK SURGERY    . CERVICAL SPINE SURGERY         Family History  Problem Relation Age of Onset  . Heart failure Mother   . Heart failure Father   . Heart attack Father     Social History   Tobacco Use  . Smoking status: Never Smoker  . Smokeless tobacco: Never Used  Vaping Use  . Vaping Use: Every day  . Substances: Nicotine  Substance Use Topics  . Alcohol use: Never  . Drug use: Not Currently    Types: Marijuana    Home Medications Prior to Admission medications   Medication Sig Start Date End Date Taking? Authorizing Provider  amLODipine (NORVASC) 10 MG tablet Take 1 tablet (10 mg total) by mouth daily. 10/06/20  Yes Marcine Matar, MD  atorvastatin (LIPITOR) 20 MG tablet Take 1 tablet (20 mg total) by mouth daily. 10/06/20  Yes Marcine Matar, MD  busPIRone (BUSPAR) 30 MG tablet Take 1 tablet (30 mg total) by mouth 2 (two) times daily. 09/08/20  Yes Toy Cookey E, NP  DULoxetine (CYMBALTA) 60 MG capsule Take 2 capsules (120 mg total) by mouth daily. Patient taking differently: Take 60 mg by mouth 2 (two) times daily. 09/08/20  Yes Toy Cookey E, NP  gabapentin (NEURONTIN) 300 MG capsule Take 300-600 mg by mouth See admin instructions. Takes 300 mg in the morning 300 mg in the afternoon and 600 mg at night 08/04/20  Yes [provider]  HYDROmorphone (DILAUDID) 4 MG tablet Take 4 mg by mouth 4 (four) times daily as needed for severe pain. 09/22/20  Yes [provider]  hydrOXYzine (ATARAX/VISTARIL) 50 MG tablet Take 1 tablet (50 mg total) by mouth 3 (three) times daily as needed for anxiety. 09/08/20  Yes Toy Cookey E, NP  levothyroxine (SYNTHROID) 50 MCG tablet Take 1 tablet (50 mcg total) by mouth daily. 10/06/20  Yes Marcine Matar, MD  lidocaine (LIDODERM) 5 % Place 1 patch onto the skin daily. Remove & Discard patch within 12 hours or as directed by MD 08/17/20  Yes Caccavale, Sophia, PA-C  lisinopril (ZESTRIL) 20 MG tablet Take 1 tablet (20 mg total) by mouth daily. 10/06/20  Yes Marcine Matar,  MD  LORazepam (ATIVAN) 0.5 MG tablet Take 1 tablet (0.5 mg total) by mouth daily as needed for anxiety. 10/06/20  Yes Toy Cookey E, NP  meloxicam (MOBIC) 15 MG tablet Take 1 tablet (15 mg total) by mouth daily. 07/19/20  Yes Renne Crigler, PA-C  methocarbamol (ROBAXIN) 500 MG tablet Take 1 tablet (500 mg total) by mouth every 6 (six) hours as needed for muscle spasms. 09/30/20  Yes Dione Booze, MD  Multiple Vitamin (MULTIVITAMIN WITH MINERALS) TABS tablet Take 1 tablet by mouth daily.   Yes [provider]  promethazine (PHENERGAN) 12.5 MG tablet Take 12.5 mg by mouth  every 6 (six) hours as needed for nausea or vomiting. 09/12/20  Yes [provider]  SUMAtriptan (IMITREX) 100 MG tablet Take 100 mg by mouth daily as needed for migraine. May repeat in 2 hours if headache persists or recurs.   Yes [provider]  ziprasidone (GEODON) 40 MG capsule Take 1 capsule (40 mg total) by mouth 2 (two) times daily with a meal. 09/08/20  Yes Toy Cookey E, NP    Allergies    Morphine and related  Review of Systems   Review of Systems  Respiratory: Negative for shortness of breath.   Cardiovascular: Positive for chest pain.  Gastrointestinal: Negative for abdominal pain.  Neurological: Negative for headaches.  All other systems reviewed and are negative.   Physical Exam Updated Vital Signs BP 124/78   Pulse (!) 105   Temp 99.8 F (37.7 C) (Oral)   Resp 16   Ht 5\' 10"  (1.778 m)   Wt 83.9 kg   SpO2 95%   BMI 26.54 kg/m   Physical Exam Vitals and nursing note reviewed.  Constitutional:      Appearance: He is well-developed.  HENT:     Head: Normocephalic and atraumatic.     Mouth/Throat:     Mouth: Mucous membranes are moist.     Pharynx: Oropharynx is clear.  Eyes:     Pupils: Pupils are equal, round, and reactive to light.  Cardiovascular:     Rate and Rhythm: Normal rate.  Pulmonary:     Effort: Pulmonary effort is normal. No respiratory distress.  Abdominal:     General: Abdomen is flat. There is no distension.  Musculoskeletal:        General: Normal range of motion.     Cervical back: Normal range of motion.  Skin:    General: Skin is warm and dry.     Coloration: Skin is not jaundiced or pale.  Neurological:     General: No focal deficit present.     Mental Status: He is alert.     ED Results / Procedures / Treatments   Labs (all labs ordered are listed, but only abnormal results are displayed) Labs Reviewed  CBC WITH DIFFERENTIAL/PLATELET - Abnormal; Notable for the following components:      Result  Value   WBC 11.0 (*)    RBC 3.60 (*)    Hemoglobin 11.5 (*)    HCT 33.7 (*)    Neutro Abs 9.2 (*)    All other components within normal limits  BASIC METABOLIC PANEL - Abnormal; Notable for the following components:   Sodium 133 (*)    Glucose, Bld 148 (*)    All other components within normal limits  URINALYSIS, ROUTINE W REFLEX MICROSCOPIC - Abnormal; Notable for the following components:   Specific Gravity, Urine >1.030 (*)    Hgb urine dipstick TRACE (*)  Ketones, ur 40 (*)    Protein, ur 30 (*)    All other components within normal limits  URINALYSIS, MICROSCOPIC (REFLEX) - Abnormal; Notable for the following components:   Bacteria, UA RARE (*)    All other components within normal limits  SARS CORONAVIRUS 2 (TAT 6-24 HRS)    EKG EKG Interpretation  Date/Time:  Friday October 17 2020 04:00:14 EDT Ventricular Rate:  105 PR Interval:  202 QRS Duration: 122 QT Interval:  368 QTC Calculation: 487 R Axis:   59 Text Interpretation: Sinus tachycardia Borderline prolonged PR interval Nonspecific intraventricular conduction delay Confirmed by Marily Memos [phone]) on 10/17/2020 4:30:47 AM   Radiology DG Chest Portable 1 View  Result Date: 10/17/2020 CLINICAL DATA:  Shortness of breath and anxiety EXAM: PORTABLE CHEST 1 VIEW COMPARISON:  04/30/2020 FINDINGS: Normal heart size and mediastinal contours. Vague nodular density at the right apex is attributed to overlap of the first and fourth ribs. No mass in this area seen by recent MRI. No acute infiltrate or edema. No effusion or pneumothorax. No acute osseous findings. IMPRESSION: No evidence of active disease. Electronically Signed   By: Marnee Spring M.D.   On: 10/17/2020 04:02    Procedures Procedures   Medications Ordered in ED Medications  diazepam (VALIUM) tablet 5 mg (5 mg Oral Given 10/17/20 0346)  hydrOXYzine (ATARAX/VISTARIL) tablet 25 mg (25 mg Oral Given 10/17/20 0433)    ED Course  I have reviewed the triage  vital signs and the nursing notes.  Pertinent labs & imaging results that were available during my care of the patient were reviewed by me and considered in my medical decision making (see chart for details).    MDM Rules/Calculators/A&P                          Workup unremarkable. Anxiety somewhat improved but still present. Will fu w/ psychiatrist for better/closer management.   Final Clinical Impression(s) / ED Diagnoses Final diagnoses:  Anxiety  Chest pain, unspecified type    Rx / DC Orders ED Discharge Orders    None       Khaila Velarde, Barbara Cower, MD 10/17/20 [phone]

## 2020-10-18 ENCOUNTER — Inpatient Hospital Stay (HOSPITAL_BASED_OUTPATIENT_CLINIC_OR_DEPARTMENT_OTHER)
Admission: EM | Admit: 2020-10-18 | Discharge: 2020-10-24 | DRG: 871 | Disposition: A | Discharge status: Home Health | Payer: 59 | Attending: Internal Medicine | Admitting: Internal Medicine

## 2020-10-18 ENCOUNTER — Encounter (HOSPITAL_BASED_OUTPATIENT_CLINIC_OR_DEPARTMENT_OTHER): Payer: Self-pay | Admitting: Emergency Medicine

## 2020-10-18 ENCOUNTER — Emergency Department (HOSPITAL_BASED_OUTPATIENT_CLINIC_OR_DEPARTMENT_OTHER): Payer: 59

## 2020-10-18 ENCOUNTER — Other Ambulatory Visit: Payer: Self-pay

## 2020-10-18 DIAGNOSIS — R7309 Other abnormal glucose: Secondary | ICD-10-CM

## 2020-10-18 DIAGNOSIS — E785 Hyperlipidemia, unspecified: Secondary | ICD-10-CM | POA: Diagnosis present

## 2020-10-18 DIAGNOSIS — J122 Parainfluenza virus pneumonia: Secondary | ICD-10-CM | POA: Diagnosis present

## 2020-10-18 DIAGNOSIS — J9601 Acute respiratory failure with hypoxia: Secondary | ICD-10-CM | POA: Diagnosis present

## 2020-10-18 DIAGNOSIS — F411 Generalized anxiety disorder: Secondary | ICD-10-CM | POA: Diagnosis present

## 2020-10-18 DIAGNOSIS — R7303 Prediabetes: Secondary | ICD-10-CM | POA: Insufficient documentation

## 2020-10-18 DIAGNOSIS — Z8249 Family history of ischemic heart disease and other diseases of the circulatory system: Secondary | ICD-10-CM

## 2020-10-18 DIAGNOSIS — Z20822 Contact with and (suspected) exposure to covid-19: Secondary | ICD-10-CM | POA: Diagnosis present

## 2020-10-18 DIAGNOSIS — G43909 Migraine, unspecified, not intractable, without status migrainosus: Secondary | ICD-10-CM | POA: Diagnosis present

## 2020-10-18 DIAGNOSIS — E538 Deficiency of other specified B group vitamins: Secondary | ICD-10-CM | POA: Diagnosis present

## 2020-10-18 DIAGNOSIS — F32A Depression, unspecified: Secondary | ICD-10-CM | POA: Diagnosis present

## 2020-10-18 DIAGNOSIS — I10 Essential (primary) hypertension: Secondary | ICD-10-CM | POA: Diagnosis present

## 2020-10-18 DIAGNOSIS — E039 Hypothyroidism, unspecified: Secondary | ICD-10-CM | POA: Diagnosis present

## 2020-10-18 DIAGNOSIS — E876 Hypokalemia: Secondary | ICD-10-CM | POA: Diagnosis not present

## 2020-10-18 DIAGNOSIS — A419 Sepsis, unspecified organism: Secondary | ICD-10-CM

## 2020-10-18 DIAGNOSIS — J189 Pneumonia, unspecified organism: Secondary | ICD-10-CM

## 2020-10-18 DIAGNOSIS — R5381 Other malaise: Secondary | ICD-10-CM

## 2020-10-18 DIAGNOSIS — A4189 Other specified sepsis: Principal | ICD-10-CM | POA: Diagnosis present

## 2020-10-18 DIAGNOSIS — Z791 Long term (current) use of non-steroidal anti-inflammatories (NSAID): Secondary | ICD-10-CM

## 2020-10-18 DIAGNOSIS — Z885 Allergy status to narcotic agent status: Secondary | ICD-10-CM

## 2020-10-18 DIAGNOSIS — F410 Panic disorder [episodic paroxysmal anxiety] without agoraphobia: Secondary | ICD-10-CM | POA: Diagnosis present

## 2020-10-18 DIAGNOSIS — D638 Anemia in other chronic diseases classified elsewhere: Secondary | ICD-10-CM | POA: Diagnosis present

## 2020-10-18 DIAGNOSIS — R739 Hyperglycemia, unspecified: Secondary | ICD-10-CM | POA: Diagnosis present

## 2020-10-18 DIAGNOSIS — Z79899 Other long term (current) drug therapy: Secondary | ICD-10-CM

## 2020-10-18 DIAGNOSIS — Z79890 Hormone replacement therapy (postmenopausal): Secondary | ICD-10-CM

## 2020-10-18 LAB — COMPREHENSIVE METABOLIC PANEL
ALT: 20 U/L (ref 0–44)
AST: 33 U/L (ref 15–41)
Albumin: 3.8 g/dL (ref 3.5–5.0)
Alkaline Phosphatase: 64 U/L (ref 38–126)
Anion gap: 12 (ref 5–15)
BUN: 18 mg/dL (ref 6–20)
CO2: 24 mmol/L (ref 22–32)
Calcium: 8.6 mg/dL — ABNORMAL LOW (ref 8.9–10.3)
Chloride: 93 mmol/L — ABNORMAL LOW (ref 98–111)
Creatinine, Ser: 0.93 mg/dL (ref 0.61–1.24)
GFR, Estimated: >60 mL/min (ref >60)
Glucose, Bld: 237 mg/dL — ABNORMAL HIGH (ref 70–99)
Potassium: 3.5 mmol/L (ref 3.5–5.1)
Sodium: 129 mmol/L — ABNORMAL LOW (ref 135–145)
Total Bilirubin: 0.8 mg/dL (ref 0.3–1.2)
Total Protein: 7.7 g/dL (ref 6.5–8.1)

## 2020-10-18 LAB — I-STAT VENOUS BLOOD GAS, ED
Acid-Base Excess: 2 mmol/L (ref 0.0–2.0)
Bicarbonate: 27.2 mmol/L (ref 20.0–28.0)
Calcium, Ion: 1.16 mmol/L (ref 1.15–1.40)
HCT: 33 % — ABNORMAL LOW (ref 39.0–52.0)
Hemoglobin: 11.2 g/dL — ABNORMAL LOW (ref 13.0–17.0)
O2 Saturation: 87 %
Patient temperature: 101.2
Potassium: 3.6 mmol/L (ref 3.5–5.1)
Sodium: 133 mmol/L — ABNORMAL LOW (ref 135–145)
TCO2: 29 mmol/L (ref 22–32)
pCO2, Ven: 46.7 mmHg (ref 44.0–60.0)
pH, Ven: 7.38 (ref 7.250–7.430)
pO2, Ven: 59 mmHg — ABNORMAL HIGH (ref 32.0–45.0)

## 2020-10-18 LAB — CBC WITH DIFFERENTIAL/PLATELET
Abs Immature Granulocytes: 0.04 10*3/uL (ref 0.00–0.07)
Basophils Absolute: 0 10*3/uL (ref 0.0–0.1)
Basophils Relative: 0 %
Eosinophils Absolute: 0 10*3/uL (ref 0.0–0.5)
Eosinophils Relative: 0 %
HCT: 31.1 % — ABNORMAL LOW (ref 39.0–52.0)
Hemoglobin: 10.9 g/dL — ABNORMAL LOW (ref 13.0–17.0)
Immature Granulocytes: 0 %
Lymphocytes Relative: 7 %
Lymphs Abs: 0.8 10*3/uL (ref 0.7–4.0)
MCH: 32.4 pg (ref 26.0–34.0)
MCHC: 35 g/dL (ref 30.0–36.0)
MCV: 92.6 fL (ref 80.0–100.0)
Monocytes Absolute: 0.5 10*3/uL (ref 0.1–1.0)
Monocytes Relative: 4 %
Neutro Abs: 10.2 10*3/uL — ABNORMAL HIGH (ref 1.7–7.7)
Neutrophils Relative %: 89 %
Platelets: 216 10*3/uL (ref 150–400)
RBC: 3.36 MIL/uL — ABNORMAL LOW (ref 4.22–5.81)
RDW: 11.8 % (ref 11.5–15.5)
WBC: 11.6 10*3/uL — ABNORMAL HIGH (ref 4.0–10.5)
nRBC: 0 % (ref 0.0–0.2)

## 2020-10-18 LAB — TROPONIN I (HIGH SENSITIVITY): Troponin I (High Sensitivity): 3 ng/L (ref <18)

## 2020-10-18 LAB — CK: Total CK: 189 U/L (ref 49–397)

## 2020-10-18 LAB — LACTIC ACID, PLASMA: Lactic Acid, Venous: 1.8 mmol/L (ref 0.5–1.9)

## 2020-10-18 IMAGING — CT CT ANGIO CHEST
3 of 9 series · 18 of 36 positions shown · IV contrast (Omnipaque)
Comparison: None.

CLINICAL DATA: Fever

EXAM:
CT ANGIOGRAPHY CHEST WITH CONTRAST
TECHNIQUE: Multidetector CT imaging of the chest was performed using the
standard protocol during bolus administration of intravenous
contrast. Multiplanar CT image reconstructions and MIPs were
obtained to evaluate the vascular anatomy.
CONTRAST:  100mL OMNIPAQUE IOHEXOL 350 MG/ML SOLN

[Series 5: pe thins · axial · 0.84mm/px · z∈[-410,-124]mm · 14 of 330 slices shown]
[im 22/330  lung]
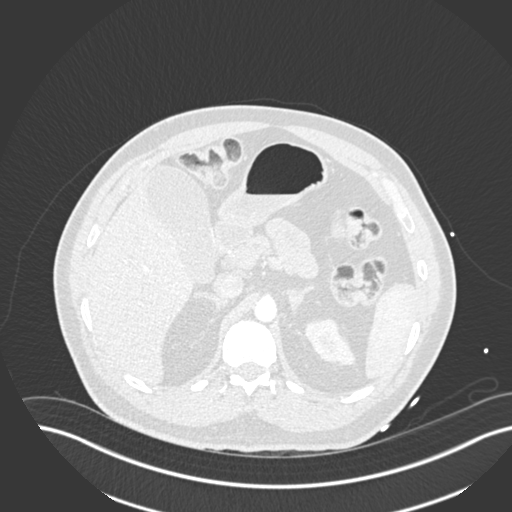
[im 44/330  mediastinal]
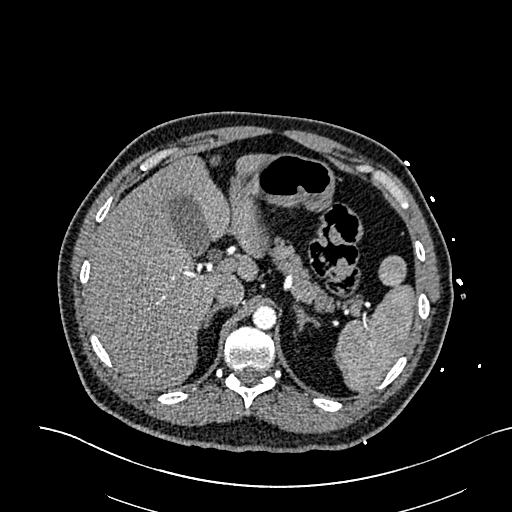
[im 66/330  lung]
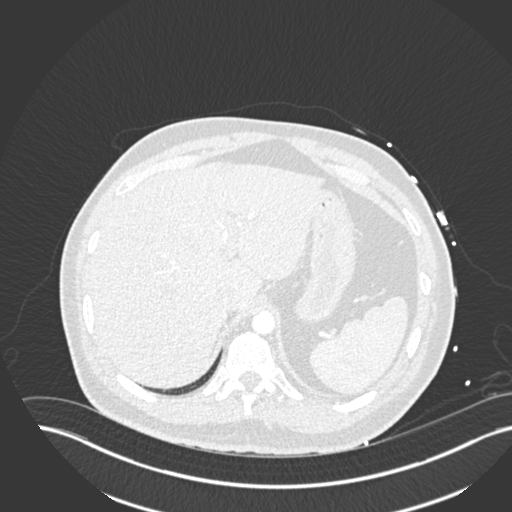
[im 88/330  mediastinal]
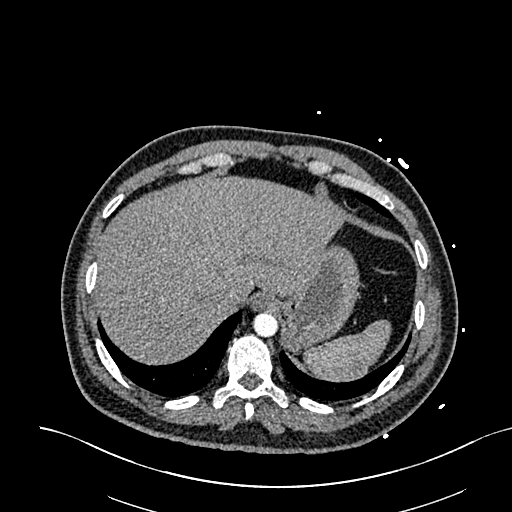
[im 110/330  lung]
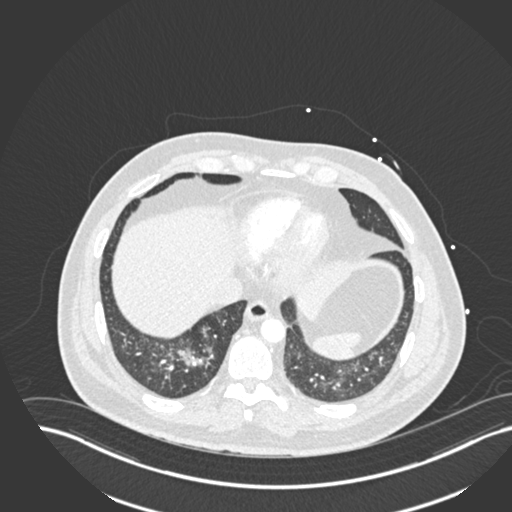
[im 132/330  mediastinal]
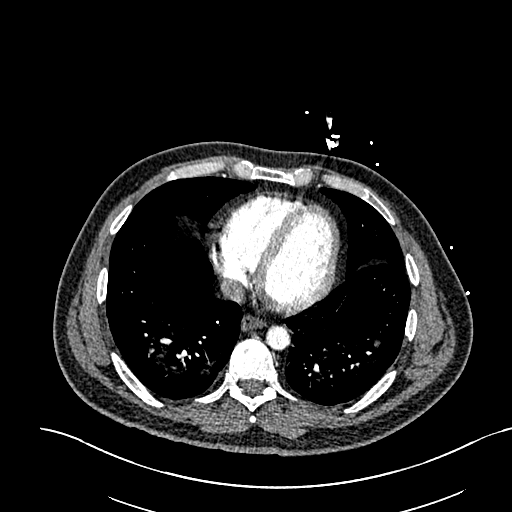
[im 154/330  lung]
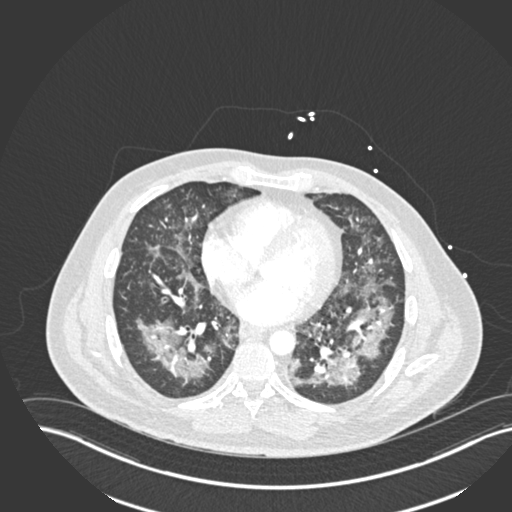
[im 176/330  mediastinal]
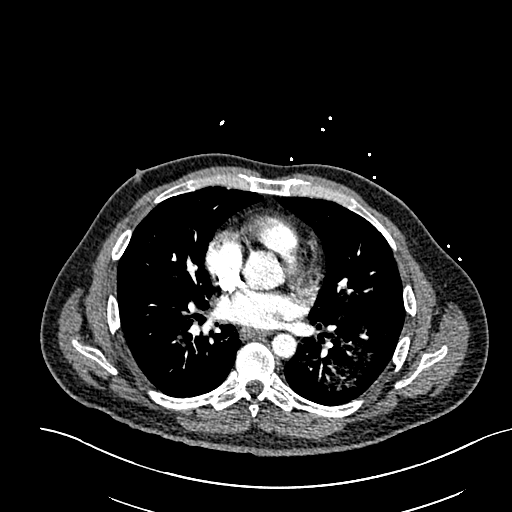
[im 198/330  lung]
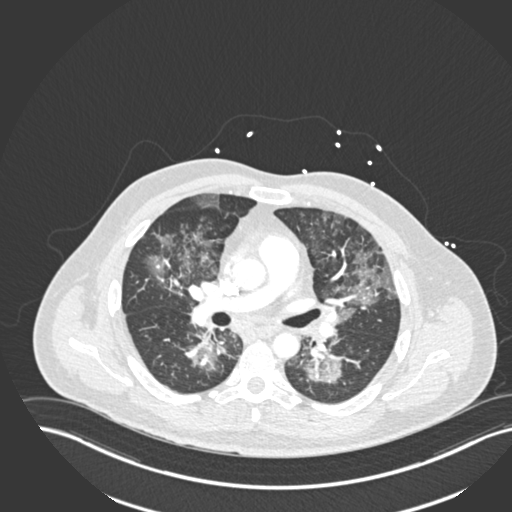
[im 220/330  mediastinal]
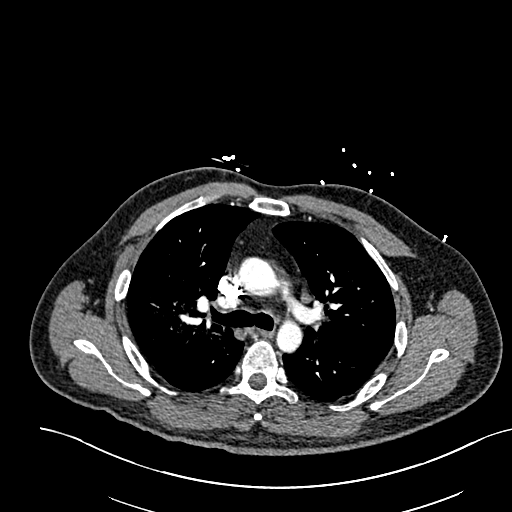
[im 242/330  lung]
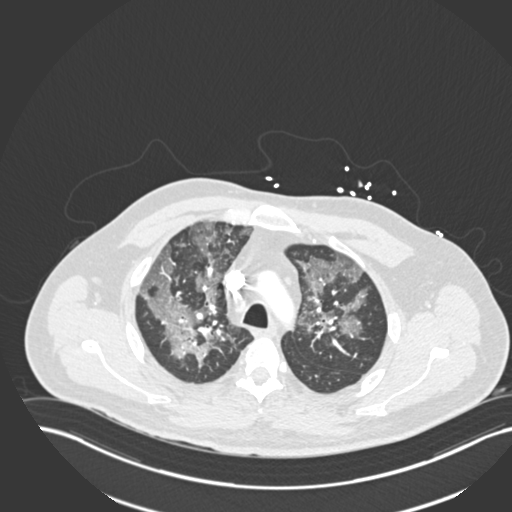
[im 264/330  mediastinal]
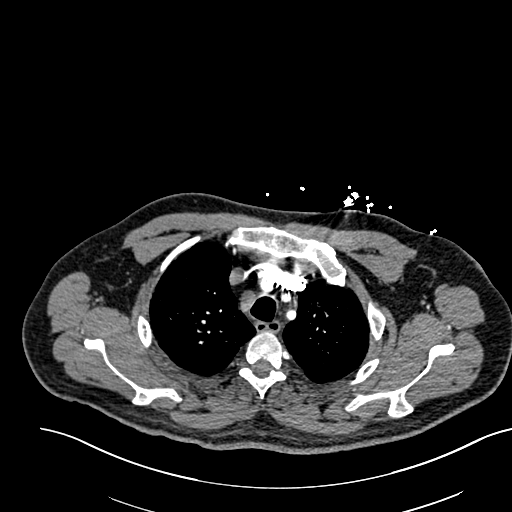
[im 286/330  lung]
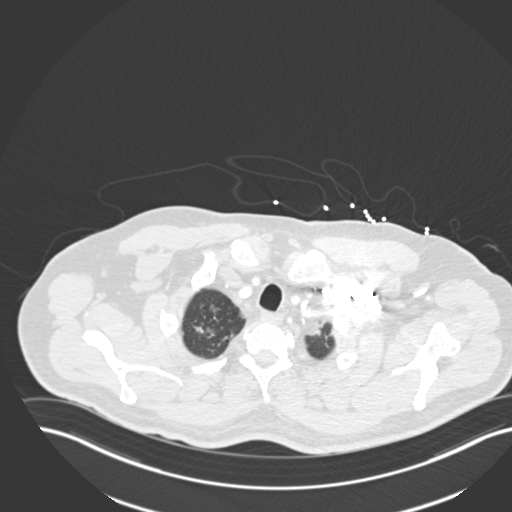
[im 308/330  mediastinal]
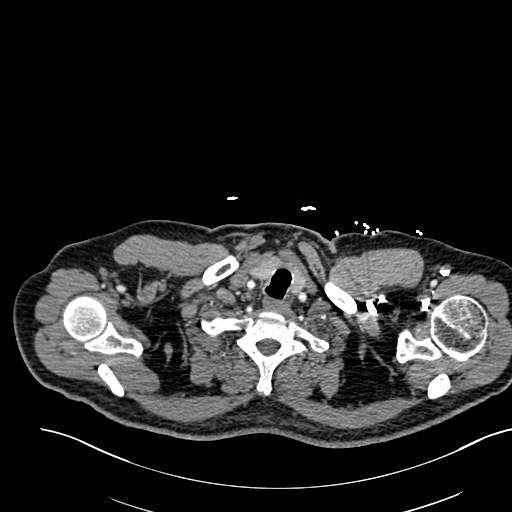

[Series 6: pe lung · axial · 0.84mm/px · z∈[-327,-177]mm · 3 of 101 slices shown]
[im 26/101  mediastinal]
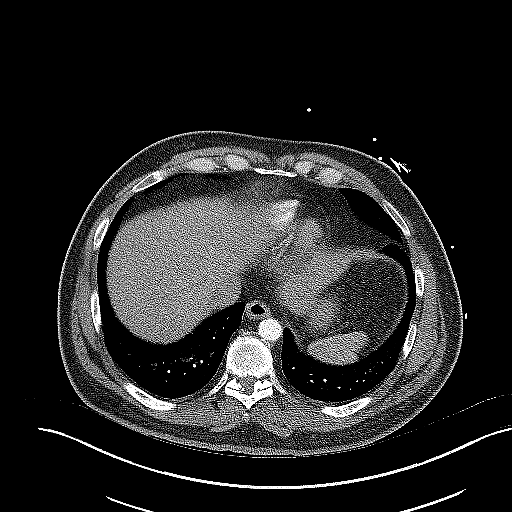
[im 51/101  mediastinal]
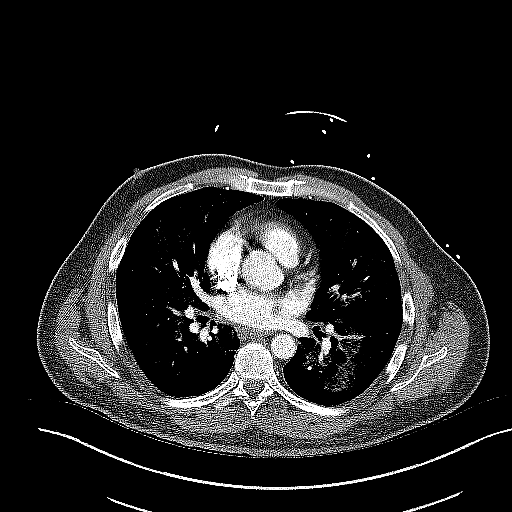
[im 76/101  mediastinal]
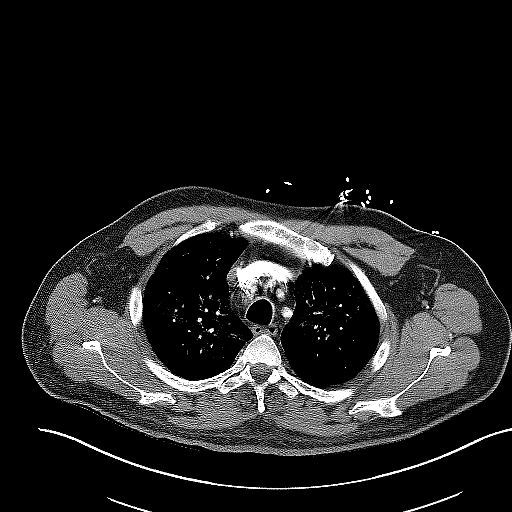

[Series 7: pe coronal mpr · coronal · 0.70mm/px · 1 of 153 slices shown]
[im 77/153  mediastinal]
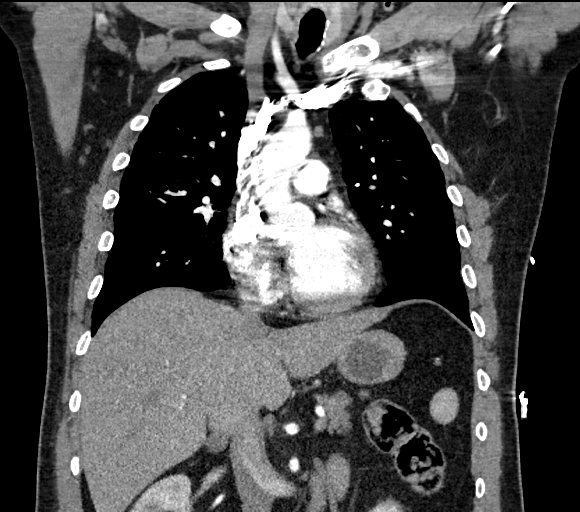

[18 of 36 positions shown; findings below may reference images not displayed]

FINDINGS: Cardiovascular: Contrast injection is sufficient to demonstrate
satisfactory opacification of the pulmonary arteries to the
segmental level. There is no pulmonary embolus or evidence of right
heart strain. The size of the main pulmonary artery is normal. Heart
size is normal, with no pericardial effusion. The course and caliber
of the aorta are normal. There is no atherosclerotic calcification.
Opacification decreased due to pulmonary arterial phase contrast
bolus timing.

Mediastinum/Nodes: No mediastinal, hilar or axillary
lymphadenopathy. Normal visualized thyroid. Thoracic esophageal
course is normal.

Lungs/Pleura: Multifocal, peripheral predominant ground glass
opacities. No pleural effusion.

Upper Abdomen: Contrast bolus timing is not optimized for evaluation
of the abdominal organs. The visualized portions of the organs of
the upper abdomen are normal.

Musculoskeletal: No chest wall abnormality. No bony spinal canal
stenosis.

Review of the MIP images confirms the above findings.
IMPRESSION: 1. No pulmonary embolus or acute aortic syndrome.
2. Multifocal, peripheral predominant ground glass opacities, most
consistent with pneumonia, including [6S].

## 2020-10-18 MED ORDER — SODIUM CHLORIDE 0.9 % IV SOLN
1.0000 g | Freq: Once | INTRAVENOUS | Status: AC
  Administered 2020-10-19: 1 g via INTRAVENOUS
  Filled 2020-10-18: qty 10

## 2020-10-18 MED ORDER — SODIUM CHLORIDE 0.9 % IV BOLUS
1000.0000 mL | Freq: Once | INTRAVENOUS | Status: AC
  Administered 2020-10-18: 1000 mL via INTRAVENOUS

## 2020-10-18 MED ORDER — IOHEXOL 350 MG/ML SOLN
100.0000 mL | Freq: Once | INTRAVENOUS | Status: AC | PRN
  Administered 2020-10-18: 100 mL via INTRAVENOUS

## 2020-10-18 MED ORDER — SODIUM CHLORIDE 0.9 % IV BOLUS
1000.0000 mL | Freq: Once | INTRAVENOUS | Status: AC
  Administered 2020-10-19: 1000 mL via INTRAVENOUS

## 2020-10-18 MED ORDER — SODIUM CHLORIDE 0.9 % IV SOLN
500.0000 mg | Freq: Once | INTRAVENOUS | Status: AC
  Administered 2020-10-19: 500 mg via INTRAVENOUS
  Filled 2020-10-18: qty 500

## 2020-10-18 MED ORDER — ACETAMINOPHEN 325 MG PO TABS
650.0000 mg | ORAL_TABLET | Freq: Once | ORAL | Status: AC | PRN
  Administered 2020-10-18: 650 mg via ORAL
  Filled 2020-10-18: qty 2

## 2020-10-18 NOTE — ED Provider Notes (Signed)
[facility] EMERGENCY DEPARTMENT Provider Note   CSN: [id number] Arrival date & time: 10/18/20  2053     History Chief Complaint  Patient presents with  . Fever  . Anxiety    Samuel Ewing is a 50 y.o. male hx of anxiety, depression, hypertension here presenting with shortness of breath and fever.  Patient's been running fever for about the last 3 days.  He states that daily he has been running temperature of 101-102.  He came in yesterday and was evaluated in the ED.  He had a normal urinalysis and normal white count.  His lactate was also normal and chest x-ray and COVID was negative.  Patient was thought to have some anxiety as well as viral infection.  Patient states that he went home and he has persistent fever.  He states that he also feels sweaty and also has shortness of breath.  He has no abdominal pain or vomiting or diarrhea.  While in triage, patient was noted to be hypoxic to 85% and tachycardic and was put on oxygen. He is not on oxygen at baseline.   The history is provided by the patient.       Past Medical History:  Diagnosis Date  . Anxiety   . Hypertension   . Migraine     Patient Active Problem List   Diagnosis Date Noted  . Mild episode of recurrent major depressive disorder (HCC) 06/09/2020  . Essential hypertension 06/06/2020  . Hyperlipidemia 06/06/2020  . Migraine with aura and without status migrainosus, not intractable 06/06/2020  . Generalized anxiety disorder 06/06/2020  . Hypothyroidism 06/06/2020  . 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 Moderate episode of recurrent major depressive disorder (HCC) 06/06/2020    Past Surgical History:  Procedure Laterality Date  . BACK SURGERY    . CERVICAL SPINE SURGERY         Family History  Problem Relation Age of Onset  . Heart failure Mother   . Heart failure Father   . Heart attack Father     Social History   Tobacco Use  . Smoking status: Never Smoker  . Smokeless tobacco: Never  Used  Vaping Use  . Vaping Use: Every day  . Substances: Nicotine  Substance Use Topics  . Alcohol use: Never  . Drug use: Not Currently    Types: Marijuana    Home Medications Prior to Admission medications   Medication Sig Start Date End Date Taking? Authorizing Provider  amLODipine (NORVASC) 10 MG tablet Take 1 tablet (10 mg total) by mouth daily. 10/06/20   Marcine Matar, MD  atorvastatin (LIPITOR) 20 MG tablet Take 1 tablet (20 mg total) by mouth daily. 10/06/20   Marcine Matar, MD  busPIRone (BUSPAR) 30 MG tablet Take 1 tablet (30 mg total) by mouth 2 (two) times daily. 09/08/20   Shanna Cisco, NP  DULoxetine (CYMBALTA) 60 MG capsule Take 2 capsules (120 mg total) by mouth daily. Patient taking differently: Take 60 mg by mouth 2 (two) times daily. 09/08/20   Shanna Cisco, NP  gabapentin (NEURONTIN) 300 MG capsule Take 300-600 mg by mouth See admin instructions. Takes 300 mg in the morning 300 mg in the afternoon and 600 mg at night 08/04/20   [provider]  HYDROmorphone (DILAUDID) 4 MG tablet Take 4 mg by mouth 4 (four) times daily as needed for severe pain. 09/22/20   [provider]  hydrOXYzine (ATARAX/VISTARIL) 50 MG tablet Take 1 tablet (50 mg  total) by mouth 3 (three) times daily as needed for anxiety. 09/08/20   Shanna Cisco, NP  levothyroxine (SYNTHROID) 50 MCG tablet Take 1 tablet (50 mcg total) by mouth daily. 10/06/20   Marcine Matar, MD  lidocaine (LIDODERM) 5 % Place 1 patch onto the skin daily. Remove & Discard patch within 12 hours or as directed by MD 08/17/20   Caccavale, Sophia, PA-C  lisinopril (ZESTRIL) 20 MG tablet Take 1 tablet (20 mg total) by mouth daily. 10/06/20   Marcine Matar, MD  LORazepam (ATIVAN) 0.5 MG tablet Take 1 tablet (0.5 mg total) by mouth daily as needed for anxiety. 10/06/20   Shanna Cisco, NP  meloxicam (MOBIC) 15 MG tablet Take 1 tablet (15 mg total) by mouth daily. 07/19/20   Renne Crigler, PA-C  methocarbamol (ROBAXIN) 500 MG tablet Take 1 tablet (500 mg total) by mouth every 6 (six) hours as needed for muscle spasms. 09/30/20   Dione Booze, MD  Multiple Vitamin (MULTIVITAMIN WITH MINERALS) TABS tablet Take 1 tablet by mouth daily.    [provider]  promethazine (PHENERGAN) 12.5 MG tablet Take 12.5 mg by mouth every 6 (six) hours as needed for nausea or vomiting. 09/12/20   [provider]  SUMAtriptan (IMITREX) 100 MG tablet Take 100 mg by mouth daily as needed for migraine. May repeat in 2 hours if headache persists or recurs.    [provider]  ziprasidone (GEODON) 40 MG capsule Take 1 capsule (40 mg total) by mouth 2 (two) times daily with a meal. 09/08/20   Shanna Cisco, NP    Allergies    Morphine and related  Review of Systems   Review of Systems  Constitutional: Positive for fever.  Respiratory: Positive for shortness of breath.   All other systems reviewed and are negative.   Physical Exam Updated Vital Signs BP 135/79   Pulse (!) 117   Temp (!) 101.5 F (38.6 C) (Oral)   Resp 13   Ht 5\' 10"  (1.778 m)   Wt 81.6 kg   SpO2 99%   BMI 25.83 kg/m   Physical Exam Vitals and nursing note reviewed.  Constitutional:      Comments: Uncomfortable and tachycardic and tachypneic  HENT:     Head: Normocephalic.     Nose: Nose normal.     Mouth/Throat:     Mouth: Mucous membranes are dry.  Eyes:     Extraocular Movements: Extraocular movements intact.     Pupils: Pupils are equal, round, and reactive to light.  Cardiovascular:     Rate and Rhythm: Regular rhythm. Tachycardia present.     Pulses: Normal pulses.     Heart sounds: Normal heart sounds.  Pulmonary:     Comments: Tachypneic, diminished breath sounds bilaterally Abdominal:     General: Abdomen is flat.     Palpations: Abdomen is soft.  Musculoskeletal:        General: Normal range of motion.     Cervical back: Normal range of motion and neck supple.   Skin:    General: Skin is warm.     Capillary Refill: Capillary refill takes less than 2 seconds.  Neurological:     General: No focal deficit present.     Mental Status: He is oriented to person, place, and time.  Psychiatric:        Mood and Affect: Mood normal.        Behavior: Behavior normal.  ED Results / Procedures / Treatments   Labs (all labs ordered are listed, but only abnormal results are displayed) Labs Reviewed  CBC WITH DIFFERENTIAL/PLATELET - Abnormal; Notable for the following components:      Result Value   WBC 11.6 (*)    RBC 3.36 (*)    Hemoglobin 10.9 (*)    HCT 31.1 (*)    Neutro Abs 10.2 (*)    All other components within normal limits  COMPREHENSIVE METABOLIC PANEL - Abnormal; Notable for the following components:   Sodium 129 (*)    Chloride 93 (*)    Glucose, Bld 237 (*)    Calcium 8.6 (*)    All other components within normal limits  I-STAT VENOUS BLOOD GAS, ED - Abnormal; Notable for the following components:   pO2, Ven 59.0 (*)    Sodium 133 (*)    HCT 33.0 (*)    Hemoglobin 11.2 (*)    All other components within normal limits  URINE CULTURE  LACTIC ACID, PLASMA  CK  LACTIC ACID, PLASMA  URINALYSIS, ROUTINE W REFLEX MICROSCOPIC  BLOOD GAS, VENOUS  TROPONIN I (HIGH SENSITIVITY)    EKG EKG Interpretation  Date/Time:  Saturday October 18 2020 21:55:54 EDT Ventricular Rate:  120 PR Interval:  171 QRS Duration: 113 QT Interval:  309 QTC Calculation: 437 R Axis:   -16 Text Interpretation: Sinus tachycardia Borderline intraventricular conduction delay ST elev, probable normal early repol pattern No significant change since last tracing Confirmed by Richardean Canal [phone]) on 10/18/2020 10:55:31 PM   Radiology DG Chest Portable 1 View  Result Date: 10/17/2020 CLINICAL DATA:  Shortness of breath and anxiety EXAM: PORTABLE CHEST 1 VIEW COMPARISON:  04/30/2020 FINDINGS: Normal heart size and mediastinal contours. Vague nodular density at  the right apex is attributed to overlap of the first and fourth ribs. No mass in this area seen by recent MRI. No acute infiltrate or edema. No effusion or pneumothorax. No acute osseous findings. IMPRESSION: No evidence of active disease. Electronically Signed   By: Marnee Spring M.D.   On: 10/17/2020 04:02    Procedures Procedures   CRITICAL CARE Performed by: Richardean Canal   Total critical care time: 30 minutes  Critical care time was exclusive of separately billable procedures and treating other patients.  Critical care was necessary to treat or prevent imminent or life-threatening deterioration.  Critical care was time spent personally by me on the following activities: development of treatment plan with patient and/or surrogate as well as nursing, discussions with consultants, evaluation of patient's response to treatment, examination of patient, obtaining history from patient or surrogate, ordering and performing treatments and interventions, ordering and review of laboratory studies, ordering and review of radiographic studies, pulse oximetry and re-evaluation of patient's condition.   Medications Ordered in ED Medications  acetaminophen (TYLENOL) tablet 650 mg (650 mg Oral Given 10/18/20 2127)  sodium chloride 0.9 % bolus 1,000 mL (1,000 mLs Intravenous New Bag/Given 10/18/20 2207)    ED Course  I have reviewed the triage vital signs and the nursing notes.  Pertinent labs & imaging results that were available during my care of the patient were reviewed by me and considered in my medical decision making (see chart for details).    MDM Rules/Calculators/A&P                         JERICK KHACHATRYAN is a 50 y.o. male here with shortness of  breath and tachycardia and hypoxia.  Patient is also febrile as well.  Reviewed labs from yesterday.  Patient was COVID-negative and labs were unremarkable yesterday.  I am concerned for possible pulmonary infarct versus pneumonia.  Patient  did not have an oxygen requirement yesterday.  We will repeat sepsis work-up with CBC, CMP, lactate, cultures, chest x-ray.  We will also get a CT angio chest.  We will give IV fluids and started on antibiotics.  Patient will need admission for hypoxia  11:16 PM WBC 11.6. Lactate nl. CTA chest pending. Anticipate admission after CTA results. Given IVF and rocephin and azithromycin. Signed out to Dr. Preston FleetingGlick in the ED.    Final Clinical Impression(s) / ED Diagnoses Final diagnoses:  None    Rx / DC Orders ED Discharge Orders    None       Charlynne PanderYao, Vernie Vinciguerra Hsienta, MD 10/18/20 2325

## 2020-10-18 NOTE — ED Notes (Signed)
Placed on 2L of oxygen via [place] for sat 85-88%.  Sat increased to 92%.  To monitor in triage room until a room is available.

## 2020-10-18 NOTE — ED Triage Notes (Signed)
Seen on the 22nd for the same.  Reports he is sick.  Only symptoms he has shared is fever, anxiety, sweats, and reports nothing tastes good so he isn't eating well.

## 2020-10-18 NOTE — ED Provider Notes (Signed)
Care assumed from Dr. Silverio Lay, patient with fever and hypoxia, negative COVID 19 test yesterday.  He has been started on empiric antibiotics, is being sent for CT angiogram of the chest.  He will need to be admitted following that.  CT seems most consistent with viral pneumonia, no evidence of pulmonary embolism.  He has already been started on antibiotics for community-acquired pneumonia.  Respiratory pathogen panel is negative for influenza and COVID-19.  Case is discussed with Dr. Leafy Half of [facility], who agrees to admit the patient.  Results for orders placed or performed during the hospital encounter of 10/18/20  Resp Panel by RT-PCR (Flu A&B, Covid) Urine, Clean Catch   Specimen: Urine, Clean Catch; Nasopharyngeal(NP) swabs in vial transport medium  Result Value Ref Range   SARS Coronavirus 2 by RT PCR NEGATIVE NEGATIVE   Influenza A by PCR NEGATIVE NEGATIVE   Influenza B by PCR NEGATIVE NEGATIVE  CBC with Differential/Platelet  Result Value Ref Range   WBC 11.6 (H) 4.0 - 10.5 K/uL   RBC 3.36 (L) 4.22 - 5.81 MIL/uL   Hemoglobin 10.9 (L) 13.0 - 17.0 g/dL   HCT 32.4 (L) 40.1 - 02.7 %   MCV 92.6 80.0 - 100.0 fL   MCH 32.4 26.0 - 34.0 pg   MCHC 35.0 30.0 - 36.0 g/dL   RDW 25.3 66.4 - 40.3 %   Platelets 216 150 - 400 K/uL   nRBC 0.0 0.0 - 0.2 %   Neutrophils Relative % 89 %   Neutro Abs 10.2 (H) 1.7 - 7.7 K/uL   Lymphocytes Relative 7 %   Lymphs Abs 0.8 0.7 - 4.0 K/uL   Monocytes Relative 4 %   Monocytes Absolute 0.5 0.1 - 1.0 K/uL   Eosinophils Relative 0 %   Eosinophils Absolute 0.0 0.0 - 0.5 K/uL   Basophils Relative 0 %   Basophils Absolute 0.0 0.0 - 0.1 K/uL   Immature Granulocytes 0 %   Abs Immature Granulocytes 0.04 0.00 - 0.07 K/uL  Comprehensive metabolic panel  Result Value Ref Range   Sodium 129 (L) 135 - 145 mmol/L   Potassium 3.5 3.5 - 5.1 mmol/L   Chloride 93 (L) 98 - 111 mmol/L   CO2 24 22 - 32 mmol/L   Glucose, Bld 237 (H) 70 - 99 mg/dL   BUN 18 6 - 20  mg/dL   Creatinine, Ser 4.74 0.61 - 1.24 mg/dL   Calcium 8.6 (L) 8.9 - 10.3 mg/dL   Total Protein 7.7 6.5 - 8.1 g/dL   Albumin 3.8 3.5 - 5.0 g/dL   AST 33 15 - 41 U/L   ALT 20 0 - 44 U/L   Alkaline Phosphatase 64 38 - 126 U/L   Total Bilirubin 0.8 0.3 - 1.2 mg/dL   GFR, Estimated >25 >95 mL/min   Anion gap 12 5 - 15  Lactic acid, plasma  Result Value Ref Range   Lactic Acid, Venous 1.8 0.5 - 1.9 mmol/L  Lactic acid, plasma  Result Value Ref Range   Lactic Acid, Venous 1.2 0.5 - 1.9 mmol/L  CK  Result Value Ref Range   Total CK 189 49 - 397 U/L  Urinalysis, Routine w reflex microscopic Urine, Clean Catch  Result Value Ref Range   Color, Urine YELLOW YELLOW   APPearance CLEAR CLEAR   Specific Gravity, Urine <1.005 (L) 1.005 - 1.030   pH 6.0 5.0 - 8.0   Glucose, UA NEGATIVE NEGATIVE mg/dL   Hgb urine dipstick TRACE (A)  NEGATIVE   Bilirubin Urine NEGATIVE NEGATIVE   Ketones, ur NEGATIVE NEGATIVE mg/dL   Protein, ur NEGATIVE NEGATIVE mg/dL   Nitrite NEGATIVE NEGATIVE   Leukocytes,Ua NEGATIVE NEGATIVE  Urinalysis, Microscopic (reflex)  Result Value Ref Range   RBC / HPF 0-5 0 - 5 RBC/hpf   WBC, UA 0-5 0 - 5 WBC/hpf   Bacteria, UA RARE (A) NONE SEEN   Squamous Epithelial / LPF 0-5 0 - 5  I-Stat venous blood gas, ED  Result Value Ref Range   pH, Ven 7.380 7.250 - 7.430   pCO2, Ven 46.7 44.0 - 60.0 mmHg   pO2, Ven 59.0 (H) 32.0 - 45.0 mmHg   Bicarbonate 27.2 20.0 - 28.0 mmol/L   TCO2 29 22 - 32 mmol/L   O2 Saturation 87.0 %   Acid-Base Excess 2.0 0.0 - 2.0 mmol/L   Sodium 133 (L) 135 - 145 mmol/L   Potassium 3.6 3.5 - 5.1 mmol/L   Calcium, Ion 1.16 1.15 - 1.40 mmol/L   HCT 33.0 (L) 39.0 - 52.0 %   Hemoglobin 11.2 (L) 13.0 - 17.0 g/dL   Patient temperature 161.0 F    Collection site IV start    Drawn by Nurse    Sample type VENOUS   Troponin I (High Sensitivity)  Result Value Ref Range   Troponin I (High Sensitivity) 3 <18 ng/L  Troponin I (High Sensitivity)  Result  Value Ref Range   Troponin I (High Sensitivity) 3 <18 ng/L   CT Angio Chest PE W and/or Wo Contrast  Result Date: 10/19/2020 CLINICAL DATA:  Fever EXAM: CT ANGIOGRAPHY CHEST WITH CONTRAST TECHNIQUE: Multidetector CT imaging of the chest was performed using the standard protocol during bolus administration of intravenous contrast. Multiplanar CT image reconstructions and MIPs were obtained to evaluate the vascular anatomy. CONTRAST:  OMNIPAQUE IOHEXOL 350 MG/ML SOLN COMPARISON:  None. FINDINGS: Cardiovascular: Contrast injection is sufficient to demonstrate satisfactory opacification of the pulmonary arteries to the segmental level. There is no pulmonary embolus or evidence of right heart strain. The size of the main pulmonary artery is normal. Heart size is normal, with no pericardial effusion. The course and caliber of the aorta are normal. There is no atherosclerotic calcification. Opacification decreased due to pulmonary arterial phase contrast bolus timing. Mediastinum/Nodes: No mediastinal, hilar or axillary lymphadenopathy. Normal visualized thyroid. Thoracic esophageal course is normal. Lungs/Pleura: Multifocal, peripheral predominant ground glass opacities. No pleural effusion. Upper Abdomen: Contrast bolus timing is not optimized for evaluation of the abdominal organs. The visualized portions of the organs of the upper abdomen are normal. Musculoskeletal: No chest wall abnormality. No bony spinal canal stenosis. Review of the MIP images confirms the above findings. IMPRESSION: 1. No pulmonary embolus or acute aortic syndrome. 2. Multifocal, peripheral predominant ground glass opacities, most consistent with pneumonia, including COVID-19. Electronically Signed   By: Deatra Robinson M.D.   On: 10/19/2020 00:06   MR Cervical Spine Wo Contrast  Result Date: 09/30/2020 CLINICAL DATA:  Cervical radiculopathy. Recent cervical spine surgery approximately 4 days prior. EXAM: MRI CERVICAL SPINE WITHOUT  CONTRAST TECHNIQUE: Multiplanar, multisequence MR imaging of the cervical spine was performed. No intravenous contrast was administered. COMPARISON:  None. FINDINGS: Alignment: Normal alignment.  Straightening of the cervical lordosis Vertebrae: Normal bone marrow.  Negative for fracture or mass. Cord: Normal signal and morphology. Posterior Fossa, vertebral arteries, paraspinal tissues: Prevertebral edema bilaterally throughout the cervical spine. No associated bone marrow edema. Disc levels: C2-3: Negative C3-4: Small central disc protrusion  mild facet degeneration and mild foraminal stenosis bilaterally. C4-5: Small central disc protrusion.  Negative for stenosis C5-6: Interbody metal prosthesis at C5-6. This is causing local artifact. No significant stenosis C6-7: Interbody metal prosthesis at C6-7. This is causing local artifact. 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 Small central disc protrusion C4-5 Prevertebral soft tissue edema throughout the cervical spine bilaterally extending into the upper cervicothoracic spine, consistent with recent cervical spine surgery. Electronically Signed   By: Marlan Palau M.D.   On: 09/30/2020 19:22   MR THORACIC SPINE WO CONTRAST  Result Date: 09/30/2020 CLINICAL DATA:  Cervical spine surgery 4 days prior. New urinary incontinence. Rule out cord compression. EXAM: MRI THORACIC SPINE WITHOUT CONTRAST TECHNIQUE: Multiplanar, multisequence MR imaging of the thoracic spine was performed. No intravenous contrast was administered. COMPARISON:  None. FINDINGS: Alignment:  Normal Vertebrae: Normal bone marrow. Negative for fracture, mass, or infection. Cord:  Normal signal and morphology.  No cord compression. Paraspinal and other soft tissues: Prevertebral edema in the cervical spine extending into the upper thoracic spine. No  loculated fluid. This is most likely related to recent cervical spine surgery. Disc levels: T1-2: Negative T2-3: Negative T3-4: Negative T4-5: Negative T5-6: Negative T6-7: Small right paracentral disc protrusion touching the cord. No cord deformity or stenosis T7-8: Mild to moderate left paracentral disc protrusion touching the cord with cord flattening. Bilateral facet degeneration. Negative for stenosis. T8-9: Bilateral mild facet degeneration.  Negative for stenosis T9-10: Small central disc protrusion. Bilateral facet degeneration. Negative for stenosis. T10-11: Bilateral facet degeneration.  Negative for stenosis T11-12: Mild facet degeneration.  Negative for stenosis T12-L1: Negative IMPRESSION: Negative for cord compression Multilevel degenerative change in the thoracic spine Prevertebral soft tissue swelling in the cervical and upper thoracic spine consistent with recent cervical spine surgery. Electronically Signed   By: Marlan Palau M.D.   On: 09/30/2020 19:28   MR LUMBAR SPINE WO CONTRAST  Result Date: 09/30/2020 CLINICAL DATA:  Cervical spine surgery 4 days ago. Urinary incontinence. Rule out cauda equina syndrome. EXAM: MRI LUMBAR SPINE WITHOUT CONTRAST TECHNIQUE: Multiplanar, multisequence MR imaging of the lumbar spine was performed. No intravenous contrast was administered. COMPARISON:  None. FINDINGS: Segmentation:  Normal Alignment:  Normal Vertebrae: Normal bone marrow. Negative for fracture, mass, or infection Conus medullaris and cauda equina: Conus extends to the L1-2 level. Conus and cauda equina appear normal. Paraspinal and other soft tissues: Negative for paraspinous mass, adenopathy, fluid collection Disc levels: Normal disc spaces.  No degenerative change or neural impingement. IMPRESSION: Normal MRI lumbar spine. Electronically Signed   By: Marlan Palau M.D.   On: 09/30/2020 19:38   DG Chest Portable 1 View  Result Date: 10/17/2020 CLINICAL DATA:  Shortness of breath and  anxiety EXAM: PORTABLE CHEST 1 VIEW COMPARISON:  04/30/2020 FINDINGS: Normal heart size and mediastinal contours. Vague nodular density at the right apex is attributed to overlap of the first and fourth ribs. No mass in this area seen by recent MRI. No acute infiltrate or edema. No effusion or pneumothorax. No acute osseous findings. IMPRESSION: No evidence of active disease. Electronically Signed   By: Marnee Spring M.D.   On: 10/17/2020 04:02   Images viewed by me.    Dione Booze, MD 10/19/20 [phone]

## 2020-10-18 NOTE — ED Notes (Signed)
02 while ambulating was 86% - Is now on 2L of 02. RT notified. Heart rate 126 ambulating.

## 2020-10-19 DIAGNOSIS — E876 Hypokalemia: Secondary | ICD-10-CM | POA: Diagnosis not present

## 2020-10-19 DIAGNOSIS — J9601 Acute respiratory failure with hypoxia: Secondary | ICD-10-CM

## 2020-10-19 DIAGNOSIS — E538 Deficiency of other specified B group vitamins: Secondary | ICD-10-CM | POA: Diagnosis present

## 2020-10-19 DIAGNOSIS — A419 Sepsis, unspecified organism: Secondary | ICD-10-CM

## 2020-10-19 DIAGNOSIS — Z79899 Other long term (current) drug therapy: Secondary | ICD-10-CM | POA: Diagnosis not present

## 2020-10-19 DIAGNOSIS — Z79890 Hormone replacement therapy (postmenopausal): Secondary | ICD-10-CM | POA: Diagnosis not present

## 2020-10-19 DIAGNOSIS — A4189 Other specified sepsis: Secondary | ICD-10-CM | POA: Diagnosis present

## 2020-10-19 DIAGNOSIS — G43909 Migraine, unspecified, not intractable, without status migrainosus: Secondary | ICD-10-CM | POA: Diagnosis present

## 2020-10-19 DIAGNOSIS — J122 Parainfluenza virus pneumonia: Secondary | ICD-10-CM | POA: Diagnosis present

## 2020-10-19 DIAGNOSIS — Z885 Allergy status to narcotic agent status: Secondary | ICD-10-CM | POA: Diagnosis not present

## 2020-10-19 DIAGNOSIS — Z8249 Family history of ischemic heart disease and other diseases of the circulatory system: Secondary | ICD-10-CM | POA: Diagnosis not present

## 2020-10-19 DIAGNOSIS — F411 Generalized anxiety disorder: Secondary | ICD-10-CM | POA: Diagnosis present

## 2020-10-19 DIAGNOSIS — D638 Anemia in other chronic diseases classified elsewhere: Secondary | ICD-10-CM | POA: Diagnosis present

## 2020-10-19 DIAGNOSIS — R7309 Other abnormal glucose: Secondary | ICD-10-CM

## 2020-10-19 DIAGNOSIS — E785 Hyperlipidemia, unspecified: Secondary | ICD-10-CM | POA: Diagnosis present

## 2020-10-19 DIAGNOSIS — F410 Panic disorder [episodic paroxysmal anxiety] without agoraphobia: Secondary | ICD-10-CM | POA: Diagnosis present

## 2020-10-19 DIAGNOSIS — R7303 Prediabetes: Secondary | ICD-10-CM | POA: Insufficient documentation

## 2020-10-19 DIAGNOSIS — R6520 Severe sepsis without septic shock: Secondary | ICD-10-CM

## 2020-10-19 DIAGNOSIS — R5381 Other malaise: Secondary | ICD-10-CM | POA: Diagnosis not present

## 2020-10-19 DIAGNOSIS — J189 Pneumonia, unspecified organism: Secondary | ICD-10-CM | POA: Insufficient documentation

## 2020-10-19 DIAGNOSIS — R739 Hyperglycemia, unspecified: Secondary | ICD-10-CM | POA: Diagnosis present

## 2020-10-19 DIAGNOSIS — F32A Depression, unspecified: Secondary | ICD-10-CM | POA: Diagnosis present

## 2020-10-19 DIAGNOSIS — E039 Hypothyroidism, unspecified: Secondary | ICD-10-CM | POA: Diagnosis present

## 2020-10-19 DIAGNOSIS — I10 Essential (primary) hypertension: Secondary | ICD-10-CM

## 2020-10-19 DIAGNOSIS — Z20822 Contact with and (suspected) exposure to covid-19: Secondary | ICD-10-CM | POA: Diagnosis present

## 2020-10-19 DIAGNOSIS — Z791 Long term (current) use of non-steroidal anti-inflammatories (NSAID): Secondary | ICD-10-CM | POA: Diagnosis not present

## 2020-10-19 LAB — COMPREHENSIVE METABOLIC PANEL
ALT: 15 U/L (ref 0–44)
AST: 26 U/L (ref 15–41)
Albumin: 3.3 g/dL — ABNORMAL LOW (ref 3.5–5.0)
Alkaline Phosphatase: 56 U/L (ref 38–126)
Anion gap: 8 (ref 5–15)
BUN: 13 mg/dL (ref 6–20)
CO2: 25 mmol/L (ref 22–32)
Calcium: 8 mg/dL — ABNORMAL LOW (ref 8.9–10.3)
Chloride: 102 mmol/L (ref 98–111)
Creatinine, Ser: 0.78 mg/dL (ref 0.61–1.24)
GFR, Estimated: >60 mL/min (ref >60)
Glucose, Bld: 155 mg/dL — ABNORMAL HIGH (ref 70–99)
Potassium: 3.4 mmol/L — ABNORMAL LOW (ref 3.5–5.1)
Sodium: 135 mmol/L (ref 135–145)
Total Bilirubin: 0.8 mg/dL (ref 0.3–1.2)
Total Protein: 6.6 g/dL (ref 6.5–8.1)

## 2020-10-19 LAB — URINALYSIS, ROUTINE W REFLEX MICROSCOPIC
Bilirubin Urine: NEGATIVE
Glucose, UA: NEGATIVE mg/dL
Ketones, ur: NEGATIVE mg/dL
Leukocytes,Ua: NEGATIVE
Nitrite: NEGATIVE
Protein, ur: NEGATIVE mg/dL
Specific Gravity, Urine: <1.005 — ABNORMAL LOW (ref 1.005–1.030)
pH: 6 (ref 5.0–8.0)

## 2020-10-19 LAB — URINALYSIS, MICROSCOPIC (REFLEX)

## 2020-10-19 LAB — CBC
HCT: 28 % — ABNORMAL LOW (ref 39.0–52.0)
Hemoglobin: 9.4 g/dL — ABNORMAL LOW (ref 13.0–17.0)
MCH: 32.2 pg (ref 26.0–34.0)
MCHC: 33.6 g/dL (ref 30.0–36.0)
MCV: 95.9 fL (ref 80.0–100.0)
Platelets: 158 10*3/uL (ref 150–400)
RBC: 2.92 MIL/uL — ABNORMAL LOW (ref 4.22–5.81)
RDW: 11.9 % (ref 11.5–15.5)
WBC: 8.3 10*3/uL (ref 4.0–10.5)
nRBC: 0 % (ref 0.0–0.2)

## 2020-10-19 LAB — HIV ANTIBODY (ROUTINE TESTING W REFLEX): HIV Screen 4th Generation wRfx: NONREACTIVE

## 2020-10-19 LAB — GLUCOSE, CAPILLARY
Glucose-Capillary: 133 mg/dL — ABNORMAL HIGH (ref 70–99)
Glucose-Capillary: 130 mg/dL — ABNORMAL HIGH (ref 70–99)
Glucose-Capillary: 102 mg/dL — ABNORMAL HIGH (ref 70–99)
Glucose-Capillary: 153 mg/dL — ABNORMAL HIGH (ref 70–99)

## 2020-10-19 LAB — MAGNESIUM: Magnesium: 2.1 mg/dL (ref 1.7–2.4)

## 2020-10-19 LAB — HEMOGLOBIN A1C
Hgb A1c MFr Bld: 6.4 % — ABNORMAL HIGH (ref 4.8–5.6)
Mean Plasma Glucose: 136.98 mg/dL

## 2020-10-19 LAB — RESP PANEL BY RT-PCR (FLU A&B, COVID) ARPGX2
Influenza A by PCR: NEGATIVE
Influenza B by PCR: NEGATIVE
SARS Coronavirus 2 by RT PCR: NEGATIVE

## 2020-10-19 LAB — PHOSPHORUS: Phosphorus: 2 mg/dL — ABNORMAL LOW (ref 2.5–4.6)

## 2020-10-19 LAB — TROPONIN I (HIGH SENSITIVITY): Troponin I (High Sensitivity): 3 ng/L (ref <18)

## 2020-10-19 LAB — LACTIC ACID, PLASMA: Lactic Acid, Venous: 1.2 mmol/L (ref 0.5–1.9)

## 2020-10-19 MED ORDER — ONDANSETRON HCL 4 MG/2ML IJ SOLN: 4.0000 mg | Freq: Four times a day (QID) | INTRAMUSCULAR | Status: DC | PRN

## 2020-10-19 MED ORDER — ALBUTEROL SULFATE (2.5 MG/3ML) 0.083% IN NEBU
2.5000 mg | INHALATION_SOLUTION | RESPIRATORY_TRACT | Status: DC | PRN
  Administered 2020-10-19 – 2020-10-23 (×7): 2.5 mg via RESPIRATORY_TRACT
  Filled 2020-10-19 (×7): qty 3

## 2020-10-19 MED ORDER — POTASSIUM CHLORIDE CRYS ER 20 MEQ PO TBCR
40.0000 meq | EXTENDED_RELEASE_TABLET | Freq: Once | ORAL | Status: AC
  Administered 2020-10-19: 40 meq via ORAL
  Filled 2020-10-19: qty 2

## 2020-10-19 MED ORDER — HYDROXYZINE HCL 25 MG PO TABS
25.0000 mg | ORAL_TABLET | Freq: Once | ORAL | Status: AC
  Administered 2020-10-19: 25 mg via ORAL
  Filled 2020-10-19: qty 1

## 2020-10-19 MED ORDER — MELOXICAM 15 MG PO TABS
15.0000 mg | ORAL_TABLET | Freq: Every day | ORAL | Status: DC
  Administered 2020-10-19 – 2020-10-24 (×6): 15 mg via ORAL
  Filled 2020-10-19 (×6): qty 1

## 2020-10-19 MED ORDER — BUSPIRONE HCL 5 MG PO TABS
30.0000 mg | ORAL_TABLET | Freq: Two times a day (BID) | ORAL | Status: DC
  Administered 2020-10-19 – 2020-10-24 (×11): 30 mg via ORAL
  Filled 2020-10-19 (×11): qty 6

## 2020-10-19 MED ORDER — ENSURE ENLIVE PO LIQD
237.0000 mL | Freq: Two times a day (BID) | ORAL | Status: DC
  Administered 2020-10-20 – 2020-10-24 (×7): 237 mL via ORAL

## 2020-10-19 MED ORDER — LORAZEPAM 0.5 MG PO TABS
0.5000 mg | ORAL_TABLET | Freq: Every day | ORAL | Status: DC | PRN
  Administered 2020-10-19 – 2020-10-21 (×3): 0.5 mg via ORAL
  Filled 2020-10-19 (×3): qty 1

## 2020-10-19 MED ORDER — ACETAMINOPHEN 325 MG PO TABS
650.0000 mg | ORAL_TABLET | Freq: Four times a day (QID) | ORAL | Status: DC | PRN
  Administered 2020-10-19 – 2020-10-21 (×4): 650 mg via ORAL
  Filled 2020-10-19 (×5): qty 2

## 2020-10-19 MED ORDER — DULOXETINE HCL 60 MG PO CPEP
60.0000 mg | ORAL_CAPSULE | Freq: Two times a day (BID) | ORAL | Status: DC
  Administered 2020-10-19 – 2020-10-24 (×11): 60 mg via ORAL
  Filled 2020-10-19 (×11): qty 1

## 2020-10-19 MED ORDER — DM-GUAIFENESIN ER 30-600 MG PO TB12
1.0000 | ORAL_TABLET | Freq: Two times a day (BID) | ORAL | Status: DC
  Administered 2020-10-19 – 2020-10-24 (×10): 1 via ORAL
  Filled 2020-10-19 (×11): qty 1

## 2020-10-19 MED ORDER — ZIPRASIDONE HCL 40 MG PO CAPS
40.0000 mg | ORAL_CAPSULE | Freq: Two times a day (BID) | ORAL | Status: DC
  Administered 2020-10-19 – 2020-10-24 (×11): 40 mg via ORAL
  Filled 2020-10-19 (×12): qty 1

## 2020-10-19 MED ORDER — LEVOTHYROXINE SODIUM 50 MCG PO TABS
50.0000 ug | ORAL_TABLET | Freq: Every day | ORAL | Status: DC
  Administered 2020-10-19 – 2020-10-24 (×6): 50 ug via ORAL
  Filled 2020-10-19 (×6): qty 1

## 2020-10-19 MED ORDER — INSULIN ASPART 100 UNIT/ML ~~LOC~~ SOLN
0.0000 [IU] | Freq: Three times a day (TID) | SUBCUTANEOUS | Status: DC
  Administered 2020-10-19 (×2): 1 [IU] via SUBCUTANEOUS
  Administered 2020-10-20: 2 [IU] via SUBCUTANEOUS
  Administered 2020-10-20 – 2020-10-21 (×3): 1 [IU] via SUBCUTANEOUS
  Administered 2020-10-22: 2 [IU] via SUBCUTANEOUS
  Administered 2020-10-22 (×2): 5 [IU] via SUBCUTANEOUS
  Administered 2020-10-23: 7 [IU] via SUBCUTANEOUS
  Administered 2020-10-23: 2 [IU] via SUBCUTANEOUS
  Administered 2020-10-23: 3 [IU] via SUBCUTANEOUS
  Administered 2020-10-24: 1 [IU] via SUBCUTANEOUS

## 2020-10-19 MED ORDER — ATORVASTATIN CALCIUM 20 MG PO TABS
20.0000 mg | ORAL_TABLET | Freq: Every day | ORAL | Status: DC
  Administered 2020-10-19 – 2020-10-24 (×6): 20 mg via ORAL
  Filled 2020-10-19 (×7): qty 1

## 2020-10-19 MED ORDER — SODIUM CHLORIDE 0.9 % IV SOLN
1.0000 g | INTRAVENOUS | Status: DC
  Administered 2020-10-19: 1 g via INTRAVENOUS
  Filled 2020-10-19: qty 10

## 2020-10-19 MED ORDER — AZITHROMYCIN 250 MG PO TABS
500.0000 mg | ORAL_TABLET | Freq: Every day | ORAL | Status: AC
  Administered 2020-10-19 – 2020-10-20 (×2): 500 mg via ORAL
  Filled 2020-10-19 (×2): qty 2

## 2020-10-19 MED ORDER — ONDANSETRON HCL 4 MG/2ML IJ SOLN
4.0000 mg | Freq: Once | INTRAMUSCULAR | Status: AC
  Administered 2020-10-19: 4 mg via INTRAVENOUS
  Filled 2020-10-19: qty 2

## 2020-10-19 MED ORDER — HYDROMORPHONE HCL 4 MG PO TABS: 4.0000 mg | ORAL_TABLET | Freq: Once | ORAL | Status: DC

## 2020-10-19 MED ORDER — ACETAMINOPHEN 650 MG RE SUPP: 650.0000 mg | Freq: Four times a day (QID) | RECTAL | Status: DC | PRN

## 2020-10-19 MED ORDER — ONDANSETRON HCL 4 MG PO TABS: 4.0000 mg | ORAL_TABLET | Freq: Four times a day (QID) | ORAL | Status: DC | PRN

## 2020-10-19 MED ORDER — ENOXAPARIN SODIUM 40 MG/0.4ML ~~LOC~~ SOLN
40.0000 mg | SUBCUTANEOUS | Status: DC
  Administered 2020-10-19 – 2020-10-24 (×6): 40 mg via SUBCUTANEOUS
  Filled 2020-10-19 (×6): qty 0.4

## 2020-10-19 MED ORDER — HYDROMORPHONE HCL 2 MG PO TABS
8.0000 mg | ORAL_TABLET | Freq: Four times a day (QID) | ORAL | Status: DC | PRN
  Administered 2020-10-19 – 2020-10-24 (×20): 8 mg via ORAL
  Filled 2020-10-19 (×20): qty 4

## 2020-10-19 MED ORDER — HYDROXYZINE HCL 25 MG PO TABS
50.0000 mg | ORAL_TABLET | Freq: Three times a day (TID) | ORAL | Status: DC | PRN
  Administered 2020-10-19 – 2020-10-24 (×11): 50 mg via ORAL
  Filled 2020-10-19 (×12): qty 2

## 2020-10-19 MED ORDER — GABAPENTIN 300 MG PO CAPS
600.0000 mg | ORAL_CAPSULE | Freq: Two times a day (BID) | ORAL | Status: DC
  Administered 2020-10-19 – 2020-10-24 (×11): 600 mg via ORAL
  Filled 2020-10-19 (×11): qty 2

## 2020-10-19 MED ORDER — SODIUM CHLORIDE 0.45 % IV SOLN: INTRAVENOUS | Status: DC

## 2020-10-19 MED ORDER — HYDROMORPHONE HCL 1 MG/ML IJ SOLN
2.0000 mg | Freq: Once | INTRAMUSCULAR | Status: AC
Start: 2020-10-19 — End: 2020-10-19
  Administered 2020-10-19: 2 mg via INTRAVENOUS
  Filled 2020-10-19: qty 2

## 2020-10-19 NOTE — Plan of Care (Signed)

## 2020-10-19 NOTE — Progress Notes (Signed)
Samuel Ewing  EXN:[id number] DOB: Mar 08, 1971 DOA: 10/18/2020 PCP: Marcine Matar, MD    Brief Narrative:  [phone] w/ a hx of anxiety/depression, HTN, and HLD who presented to the ER 4/23 with dyspnea and fever for 3 days.  He was COVID-negative.  T-max at home was 102.  In the ED he was found to have a temperature of 101.5 and a heart rate of 124.  Saturations were 88% on room air.  CTa of the chest revealed no evidence of PE but did note multifocal pulmonary infiltrates.  Consultants:  None  Code Status: FULL CODE  Antimicrobials:  Rocephin 4/23 > Azithromycin 4/23 >  DVT prophylaxis: Lovenox  Subjective: Patient was interviewed and examined by one of my partners earlier today.  He is seen in follow-up.  Assessment & Plan:  Multifocal pneumonia with acute hypoxic respiratory failure - Sepsis POA Bilateral pulmonary infiltrates noted on CT chest -confirmed COVID-negative -continue empiric antibiotic -supplemental oxygen as needed and wean as able, with patient presently still requiring 5 to 6 L nasal cannula support - sepsis physiology has resolved  Hypokalemia Likely due to simple poor intake -supplement and follow -magnesium normal  Normocytic anemia Unusual for an otherwise healthy 50 year old male -check anemia panel  Severe anxiety disorder with depression Continue usual home medications  Outpatient "cervical spine surgery" 09/26/20  Hyperglycemia Appears to have a history of this without being formally diagnosed with DM -A1c pending  Essential HTN   Family Communication:  Status is: Observation  The patient will require care spanning > 2 midnights and should be moved to inpatient because: Inpatient level of care appropriate due to severity of illness  Dispo: The patient is from: Home              Anticipated d/c is to: Home              Patient currently is not medically stable to d/c.   Difficult to place patient No  Objective: Blood pressure 126/84,  pulse 96, temperature 98.9 F (37.2 C), temperature source Oral, resp. rate 18, height 5\' 10"  (1.778 m), weight 88.6 kg, SpO2 93 %.  Intake/Output Summary (Last 24 hours) at 10/19/2020 0811 Last data filed at 10/19/2020 0600 Gross per 24 hour  Intake 2667.21 ml  Output 200 ml  Net 2467.21 ml   Filed Weights   10/18/20 2107 10/19/20 0258  Weight: 81.6 kg 88.6 kg    Examination: Examined by one of my partners earlier today  CBC: Recent Labs  Lab 10/17/20 0353 10/18/20 2149 10/18/20 2208 10/19/20 0604  WBC 11.0* 11.6*  --  8.3  NEUTROABS 9.2* 10.2*  --   --   HGB 11.5* 10.9* 11.2* 9.4*  HCT 33.7* 31.1* 33.0* 28.0*  MCV 93.6 92.6  --  95.9  PLT 217 216  --  158   Basic Metabolic Panel: Recent Labs  Lab 10/17/20 0353 10/18/20 2149 10/18/20 2208 10/19/20 0604  NA 133* 129* 133* 135  K 4.0 3.5 3.6 3.4*  CL 100 93*  --  102  CO2 22 24  --  25  GLUCOSE 148* 237*  --  155*  BUN 16 18  --  13  CREATININE 0.86 0.93  --  0.78  CALCIUM 8.9 8.6*  --  8.0*  MG  --   --   --  2.1  PHOS  --   --   --  2.0*   GFR: Estimated Creatinine Clearance: 125.1 mL/min (by C-G formula  based on SCr of 0.78 mg/dL).  Liver Function Tests: Recent Labs  Lab 10/18/20 2149 10/19/20 0604  AST 33 26  ALT 20 15  ALKPHOS 64 56  BILITOT 0.8 0.8  PROT 7.7 6.6  ALBUMIN 3.8 3.3*    Cardiac Enzymes: Recent Labs  Lab 10/18/20 2149  CKTOTAL 189    HbA1C: No results found for: HGBA1C  CBG: Recent Labs  Lab 10/19/20 0746  GLUCAP 133*    Recent Results (from the past 240 hour(s))  SARS CORONAVIRUS 2 (TAT 6-24 HRS) Nasopharyngeal Nasopharyngeal Swab     Status: None   Collection Time: 10/17/20  3:53 AM   Specimen: Nasopharyngeal Swab  Result Value Ref Range Status   SARS Coronavirus 2 NEGATIVE NEGATIVE Final    Comment: (NOTE) SARS-CoV-2 target nucleic acids are NOT DETECTED.  The SARS-CoV-2 RNA is generally detectable in upper and lower respiratory specimens during the acute  phase of infection. Negative results do not preclude SARS-CoV-2 infection, do not rule out co-infections with other pathogens, and should not be used as the sole basis for treatment or other patient management decisions. Negative results must be combined with clinical observations, patient history, and epidemiological information. The expected result is Negative.  Fact Sheet for Patients: HairSlick.no  Fact Sheet for Healthcare Providers: quierodirigir.com  This test is not yet approved or cleared by the [place] FDA and  has been authorized for detection and/or diagnosis of SARS-CoV-2 by FDA under an Emergency Use Authorization (EUA). This EUA will remain  in effect (meaning this test can be used) for the duration of the COVID-19 declaration under Se ction 564(b)(1) of the Act, 21 U.S.C. section 360bbb-3(b)(1), unless the authorization is terminated or revoked sooner.  Performed at [facility], [address] [place], [place] [address]   Resp Panel by RT-PCR (Flu A&B, Covid) Urine, Clean Catch     Status: None   Collection Time: 10/19/20 12:14 AM   Specimen: Urine, Clean Catch; Nasopharyngeal(NP) swabs in vial transport medium  Result Value Ref Range Status   SARS Coronavirus 2 by RT PCR NEGATIVE NEGATIVE Final    Comment: (NOTE) SARS-CoV-2 target nucleic acids are NOT DETECTED.  The SARS-CoV-2 RNA is generally detectable in upper respiratory specimens during the acute phase of infection. The lowest concentration of SARS-CoV-2 viral copies this assay can detect is 138 copies/mL. A negative result does not preclude SARS-Cov-2 infection and should not be used as the sole basis for treatment or other patient management decisions. A negative result may occur with  improper specimen collection/handling, submission of specimen other than nasopharyngeal swab, presence of viral mutation(s) within the areas targeted by  this assay, and inadequate number of viral copies(<138 copies/mL). A negative result must be combined with clinical observations, patient history, and epidemiological information. The expected result is Negative.  Fact Sheet for Patients:  BloggerCourse.com  Fact Sheet for Healthcare Providers:  SeriousBroker.it  This test is no t yet approved or cleared by the [place] FDA and  has been authorized for detection and/or diagnosis of SARS-CoV-2 by FDA under an Emergency Use Authorization (EUA). This EUA will remain  in effect (meaning this test can be used) for the duration of the COVID-19 declaration under Section 564(b)(1) of the Act, 21 U.S.C.section 360bbb-3(b)(1), unless the authorization is terminated  or revoked sooner.       Influenza A by PCR NEGATIVE NEGATIVE Final   Influenza B by PCR NEGATIVE NEGATIVE Final    Comment: (NOTE) The  Xpert Xpress SARS-CoV-2/FLU/RSV plus assay is intended as an aid in the diagnosis of influenza from Nasopharyngeal swab specimens and should not be used as a sole basis for treatment. Nasal washings and aspirates are unacceptable for Xpert Xpress SARS-CoV-2/FLU/RSV testing.  Fact Sheet for Patients: BloggerCourse.com  Fact Sheet for Healthcare Providers: SeriousBroker.it  This test is not yet approved or cleared by the [place] FDA and has been authorized for detection and/or diagnosis of SARS-CoV-2 by FDA under an Emergency Use Authorization (EUA). This EUA will remain in effect (meaning this test can be used) for the duration of the COVID-19 declaration under Section 564(b)(1) of the Act, 21 U.S.C. section 360bbb-3(b)(1), unless the authorization is terminated or revoked.  Performed at [facility], [address] [place], [place] [address]      Scheduled Meds: . atorvastatin  20 mg Oral Daily  . azithromycin   500 mg Oral Daily  . busPIRone  30 mg Oral BID  . DULoxetine  60 mg Oral BID  . enoxaparin (LOVENOX) injection  40 mg Subcutaneous Q24H  . feeding supplement  237 mL Oral BID BM  . gabapentin  600 mg Oral BID  . insulin aspart  0-9 Units Subcutaneous TID WC  . levothyroxine  50 mcg Oral Q0600  . meloxicam  15 mg Oral Daily  . ziprasidone  40 mg Oral BID WC   Continuous Infusions: . cefTRIAXone (ROCEPHIN)  IV       LOS: 0 days   Lonia Blood, MD [facility] Office  [phone] Pager - Text Page per Amion  If 7PM-7AM, please contact night-coverage per Amion 10/19/2020, 8:11 AM

## 2020-10-19 NOTE — Progress Notes (Signed)
Pt diaphoretic and complaint of night sweats on 6 L [place], would TB be neccessary to r/o?

## 2020-10-19 NOTE — H&P (Signed)
History and Physical  Patient Name: Samuel Ewing     IYM:[id number]    DOB: Sep 16, 1970    DOA: 10/18/2020 PCP: Marcine Matar, MD  Patient coming from: Home  Chief Complaint: Dyspnea, fever, chills, anxiety    HPI: Samuel Ewing is a 50 y.o. male, with PMH of anxiety, depression, hypertension, dyslipidemia, who presented to the ER on 10/18/2020 with dyspnea and fever.  Patient states his symptoms started occurring approximately 3 days ago.  Initially presented with anxiety as he became very anxious.  He then presented to the ED on 4/22 due to the symptoms, and it was thought it was just an anxiety attack.  Initial work-up was benign, COVID-negative.  However his symptoms continued.  He started to run a fever at home, T-max 102 F, with significant sweating.  He also started to have some dyspnea.  And additionally had worsening of his anxiety again.  And thus he presented back to the hospital for evaluation.  He denies any respiratory issues at baseline, but does vape.  He was hospitalized at the beginning of the month for surgery related to his neck.  He was intubated for the procedure.    ED course: -Vitals on admission: 101.5 F, heart rate 124, blood pressure 132/82, satting 88% on room air -Labs on initial presentation: Sodium 129, potassium 3.5, bicarb 24, glucose 237, BUN 18, creatinine 0.93, WBC 11.6, hemoglobin 10.8, COVID and influenza negative -Imaging obtained on admission: CTA of chest was negative for PE but did demonstrate multifocal pneumonia -In the ED the patient was given azithromycin, Rocephin, Tylenol, hydroxyzine, Zofran, IV fluids, Dilaudid, and he was transferred to [facility] for higher level care.     ROS: A complete and thorough 12 point review of systems obtained, negative listed in HPI.     Past Medical History:  Diagnosis Date  . Anxiety   . Hypertension   . Migraine     Past Surgical History:  Procedure Laterality Date  .  BACK SURGERY    . CERVICAL SPINE SURGERY      Social History: Patient lives at home.  The patient walks without assistance.  Non-smoker but vapes.  Allergies  Allergen Reactions  . Morphine And Related     Family history: family history includes Heart attack in his father; Heart failure in his father and mother.  Prior to Admission medications   Medication Sig Start Date End Date Taking? Authorizing Provider  amLODipine (NORVASC) 10 MG tablet Take 1 tablet (10 mg total) by mouth daily. 10/06/20   Marcine Matar, MD  atorvastatin (LIPITOR) 20 MG tablet Take 1 tablet (20 mg total) by mouth daily. 10/06/20   Marcine Matar, MD  busPIRone (BUSPAR) 30 MG tablet Take 1 tablet (30 mg total) by mouth 2 (two) times daily. 09/08/20   Shanna Cisco, NP  DULoxetine (CYMBALTA) 60 MG capsule Take 2 capsules (120 mg total) by mouth daily. Patient taking differently: Take 60 mg by mouth 2 (two) times daily. 09/08/20   Shanna Cisco, NP  gabapentin (NEURONTIN) 300 MG capsule Take 300-600 mg by mouth See admin instructions. Takes 300 mg in the morning 300 mg in the afternoon and 600 mg at night 08/04/20   [provider]  HYDROmorphone (DILAUDID) 4 MG tablet Take 4 mg by mouth 4 (four) times daily as needed for severe pain. 09/22/20   [provider]  hydrOXYzine (ATARAX/VISTARIL) 50 MG tablet Take 1 tablet (50 mg total) by  mouth 3 (three) times daily as needed for anxiety. 09/08/20   Shanna Cisco, NP  levothyroxine (SYNTHROID) 50 MCG tablet Take 1 tablet (50 mcg total) by mouth daily. 10/06/20   Marcine Matar, MD  lidocaine (LIDODERM) 5 % Place 1 patch onto the skin daily. Remove & Discard patch within 12 hours or as directed by MD 08/17/20   Caccavale, Sophia, PA-C  lisinopril (ZESTRIL) 20 MG tablet Take 1 tablet (20 mg total) by mouth daily. 10/06/20   Marcine Matar, MD  LORazepam (ATIVAN) 0.5 MG tablet Take 1 tablet (0.5 mg total) by mouth daily as needed for  anxiety. 10/06/20   Shanna Cisco, NP  meloxicam (MOBIC) 15 MG tablet Take 1 tablet (15 mg total) by mouth daily. 07/19/20   Renne Crigler, PA-C  methocarbamol (ROBAXIN) 500 MG tablet Take 1 tablet (500 mg total) by mouth every 6 (six) hours as needed for muscle spasms. 09/30/20   Dione Booze, MD  Multiple Vitamin (MULTIVITAMIN WITH MINERALS) TABS tablet Take 1 tablet by mouth daily.    [provider]  promethazine (PHENERGAN) 12.5 MG tablet Take 12.5 mg by mouth every 6 (six) hours as needed for nausea or vomiting. 09/12/20   [provider]  SUMAtriptan (IMITREX) 100 MG tablet Take 100 mg by mouth daily as needed for migraine. May repeat in 2 hours if headache persists or recurs.    [provider]  ziprasidone (GEODON) 40 MG capsule Take 1 capsule (40 mg total) by mouth 2 (two) times daily with a meal. 09/08/20   Shanna Cisco, NP       Physical Exam: BP 130/84 (BP Location: Right Arm)   Pulse 93   Temp 98.9 F (37.2 C) (Oral)   Resp 20   Ht 5\' 10"  (1.778 m)   Wt 81.6 kg   SpO2 96%   BMI 25.83 kg/m   General appearance: Well-developed, adult male, alert and in no acute distress .   Eyes: Anicteric, conjunctiva pink, lids and lashes normal. PERRL.    ENT: No nasal deformity, discharge, epistaxis.  Hearing intact. OP moist without lesions.   Neck: No neck masses.  Trachea midline.  No thyromegaly/tenderness. Lymph: No cervical or supraclavicular lymphadenopathy. Skin:  Sweaty Cardiac: Tachycardic, nl S1-S2, no murmurs appreciated.  No LE edema.  Radial and pedal pulses 2+ and symmetric. Respiratory:  Diminished breath sounds bilaterally, coarse, no wheezing Abdomen: Abdomen soft.  No tenderness with palpation. No ascites, distension, hepatosplenomegaly.   MSK: No deformities or effusions of the large joints of the upper or lower extremities bilaterally.  No cyanosis or clubbing. Neuro: Cranial nerves 2 through 12 grossly intact.  Sensation intact  to light touch. Speech is fluent.     Psych:  Anxious appearing    Labs on Admission:  I have personally reviewed following labs and imaging studies: CBC: Recent Labs  Lab 10/17/20 0353 10/18/20 2149 10/18/20 2208  WBC 11.0* 11.6*  --   NEUTROABS 9.2* 10.2*  --   HGB 11.5* 10.9* 11.2*  HCT 33.7* 31.1* 33.0*  MCV 93.6 92.6  --   PLT 217 216  --    Basic Metabolic Panel: Recent Labs  Lab 10/17/20 0353 10/18/20 2149 10/18/20 2208  NA 133* 129* 133*  K 4.0 3.5 3.6  CL 100 93*  --   CO2 22 24  --   GLUCOSE 148* 237*  --   BUN 16 18  --   CREATININE 0.86 0.93  --  CALCIUM 8.9 8.6*  --    GFR: Estimated Creatinine Clearance: 99.2 mL/min (by C-G formula based on SCr of 0.93 mg/dL).  Liver Function Tests: Recent Labs  Lab 10/18/20 2149  AST 33  ALT 20  ALKPHOS 64  BILITOT 0.8  PROT 7.7  ALBUMIN 3.8   No results for input(s): LIPASE, AMYLASE in the last 168 hours. No results for input(s): AMMONIA in the last 168 hours. Coagulation Profile: No results for input(s): INR, PROTIME in the last 168 hours. Cardiac Enzymes: Recent Labs  Lab 10/18/20 2149  CKTOTAL 189   BNP (last 3 results) No results for input(s): PROBNP in the last 8760 hours. HbA1C: No results for input(s): HGBA1C in the last 72 hours. CBG: No results for input(s): GLUCAP in the last 168 hours. Lipid Profile: No results for input(s): CHOL, HDL, LDLCALC, TRIG, CHOLHDL, LDLDIRECT in the last 72 hours. Thyroid Function Tests: No results for input(s): TSH, T4TOTAL, FREET4, T3FREE, THYROIDAB in the last 72 hours. Anemia Panel: No results for input(s): VITAMINB12, FOLATE, FERRITIN, TIBC, IRON, RETICCTPCT in the last 72 hours.   Recent Results (from the past 240 hour(s))  SARS CORONAVIRUS 2 (TAT 6-24 HRS) Nasopharyngeal Nasopharyngeal Swab     Status: None   Collection Time: 10/17/20  3:53 AM   Specimen: Nasopharyngeal Swab  Result Value Ref Range Status   SARS Coronavirus 2 NEGATIVE NEGATIVE  Final    Comment: (NOTE) SARS-CoV-2 target nucleic acids are NOT DETECTED.  The SARS-CoV-2 RNA is generally detectable in upper and lower respiratory specimens during the acute phase of infection. Negative results do not preclude SARS-CoV-2 infection, do not rule out co-infections with other pathogens, and should not be used as the sole basis for treatment or other patient management decisions. Negative results must be combined with clinical observations, patient history, and epidemiological information. The expected result is Negative.  Fact Sheet for Patients: HairSlick.no  Fact Sheet for Healthcare Providers: quierodirigir.com  This test is not yet approved or cleared by the [place] FDA and  has been authorized for detection and/or diagnosis of SARS-CoV-2 by FDA under an Emergency Use Authorization (EUA). This EUA will remain  in effect (meaning this test can be used) for the duration of the COVID-19 declaration under Se ction 564(b)(1) of the Act, 21 U.S.C. section 360bbb-3(b)(1), unless the authorization is terminated or revoked sooner.  Performed at [facility], [address], [place], [place] [address]   Resp Panel by RT-PCR (Flu A&B, Covid) Urine, Clean Catch     Status: None   Collection Time: 10/19/20 12:14 AM   Specimen: Urine, Clean Catch; Nasopharyngeal(NP) swabs in vial transport medium  Result Value Ref Range Status   SARS Coronavirus 2 by RT PCR NEGATIVE NEGATIVE Final    Comment: (NOTE) SARS-CoV-2 target nucleic acids are NOT DETECTED.  The SARS-CoV-2 RNA is generally detectable in upper respiratory specimens during the acute phase of infection. The lowest concentration of SARS-CoV-2 viral copies this assay can detect is 138 copies/mL. A negative result does not preclude SARS-Cov-2 infection and should not be used as the sole basis for treatment or other patient management decisions. A  negative result may occur with  improper specimen collection/handling, submission of specimen other than nasopharyngeal swab, presence of viral mutation(s) within the areas targeted by this assay, and inadequate number of viral copies(<138 copies/mL). A negative result must be combined with clinical observations, patient history, and epidemiological information. The expected result is Negative.  Fact Sheet for Patients:  BloggerCourse.com  Fact Sheet for Healthcare Providers:  SeriousBroker.it  This test is no t yet approved or cleared by the [place] FDA and  has been authorized for detection and/or diagnosis of SARS-CoV-2 by FDA under an Emergency Use Authorization (EUA). This EUA will remain  in effect (meaning this test can be used) for the duration of the COVID-19 declaration under Section 564(b)(1) of the Act, 21 U.S.C.section 360bbb-3(b)(1), unless the authorization is terminated  or revoked sooner.       Influenza A by PCR NEGATIVE NEGATIVE Final   Influenza B by PCR NEGATIVE NEGATIVE Final    Comment: (NOTE) The Xpert Xpress SARS-CoV-2/FLU/RSV plus assay is intended as an aid in the diagnosis of influenza from Nasopharyngeal swab specimens and should not be used as a sole basis for treatment. Nasal washings and aspirates are unacceptable for Xpert Xpress SARS-CoV-2/FLU/RSV testing.  Fact Sheet for Patients: BloggerCourse.com  Fact Sheet for Healthcare Providers: SeriousBroker.it  This test is not yet approved or cleared by the [place] FDA and has been authorized for detection and/or diagnosis of SARS-CoV-2 by FDA under an Emergency Use Authorization (EUA). This EUA will remain in effect (meaning this test can be used) for the duration of the COVID-19 declaration under Section 564(b)(1) of the Act, 21 U.S.C. section 360bbb-3(b)(1), unless the authorization  is terminated or revoked.  Performed at [facility], [address] [place], [place] [address]            Radiological Exams on Admission: Personally reviewed imaging which shows: CTA of chest did not show PE but did show findings consistent with pneumonia CT Angio Chest PE W and/or Wo Contrast  Result Date: 10/19/2020 CLINICAL DATA:  Fever EXAM: CT ANGIOGRAPHY CHEST WITH CONTRAST TECHNIQUE: Multidetector CT imaging of the chest was performed using the standard protocol during bolus administration of intravenous contrast. Multiplanar CT image reconstructions and MIPs were obtained to evaluate the vascular anatomy. CONTRAST:  OMNIPAQUE IOHEXOL 350 MG/ML SOLN COMPARISON:  None. FINDINGS: Cardiovascular: Contrast injection is sufficient to demonstrate satisfactory opacification of the pulmonary arteries to the segmental level. There is no pulmonary embolus or evidence of right heart strain. The size of the main pulmonary artery is normal. Heart size is normal, with no pericardial effusion. The course and caliber of the aorta are normal. There is no atherosclerotic calcification. Opacification decreased due to pulmonary arterial phase contrast bolus timing. Mediastinum/Nodes: No mediastinal, hilar or axillary lymphadenopathy. Normal visualized thyroid. Thoracic esophageal course is normal. Lungs/Pleura: Multifocal, peripheral predominant ground glass opacities. No pleural effusion. Upper Abdomen: Contrast bolus timing is not optimized for evaluation of the abdominal organs. The visualized portions of the organs of the upper abdomen are normal. Musculoskeletal: No chest wall abnormality. No bony spinal canal stenosis. Review of the MIP images confirms the above findings. IMPRESSION: 1. No pulmonary embolus or acute aortic syndrome. 2. Multifocal, peripheral predominant ground glass opacities, most consistent with pneumonia, including COVID-19. Electronically Signed   By: Deatra Robinson  M.D.   On: 10/19/2020 00:06   DG Chest Portable 1 View  Result Date: 10/17/2020 CLINICAL DATA:  Shortness of breath and anxiety EXAM: PORTABLE CHEST 1 VIEW COMPARISON:  04/30/2020 FINDINGS: Normal heart size and mediastinal contours. Vague nodular density at the right apex is attributed to overlap of the first and fourth ribs. No mass in this area seen by recent MRI. No acute infiltrate or edema. No effusion or pneumothorax. No acute osseous findings. IMPRESSION: No evidence of  active disease. Electronically Signed   By: Marnee SpringJonathon  Watts M.D.   On: 10/17/2020 04:02         Assessment/Plan   1.  Acute hypoxic respiratory failure -Suspect secondary to pneumonia - Initial presentation, patient was satting 88% on room air.  Additionally septic picture with fever and tachycardia - CTA of the chest was negative for PE but did show findings consistent with pneumonia - COVID and influenza negative - Started on Rocephin and azithromycin, continue for now. - Breathing treatments as needed - Wean O2 as tolerated  2.  Sepsis -On admission, patient febrile, tachycardic - Likely secondary to pneumonia - See further plans above  3.  Multifocal pneumonia -See further plans above  4.  Severe anxiety depression -Continue home regiment  5.  Postop pain from recent cervical/spine surgery -Continue home regiment  6.  Elevated glucose - Appears to have history of elevated glucose in the past.  On admission was 237 - Hemoglobin A1c ordered - Glucose checks, sliding scale added  7.  Essential hypertension -Currently blood pressure controlled - We will hold off on resuming home blood pressure medications until sepsis improves    DVT prophylaxis: Lovenox Code Status: Full Family Communication: None Disposition Plan: Anticipate discharge home when medically optimized Consults called: None Admission status: Observation   At the point of initial evaluation, it is my clinical opinion that  admission for OBSERVATION is reasonable and necessary because the patient's presenting complaints in the context of their chronic conditions represent sufficient risk of deterioration or significant morbidity to constitute reasonable grounds for close observation in the hospital setting, but that the patient may be medically stable for discharge from the hospital within 24 to 48 hours.    Medical decision making: Patient seen at 3:13 AM on 10/19/2020 What exists of the patient's chart was reviewed in depth and summarized above.  Clinical condition: Fair.        Laqueta Dueichard G Jennalee Greaves [facility] Please page though AMION or Epic secure chat:  For password, contact charge nurse

## 2020-10-20 ENCOUNTER — Inpatient Hospital Stay (HOSPITAL_COMMUNITY): Payer: 59

## 2020-10-20 DIAGNOSIS — J9601 Acute respiratory failure with hypoxia: Secondary | ICD-10-CM | POA: Diagnosis not present

## 2020-10-20 DIAGNOSIS — J122 Parainfluenza virus pneumonia: Secondary | ICD-10-CM | POA: Diagnosis not present

## 2020-10-20 DIAGNOSIS — F410 Panic disorder [episodic paroxysmal anxiety] without agoraphobia: Secondary | ICD-10-CM | POA: Diagnosis not present

## 2020-10-20 DIAGNOSIS — A4189 Other specified sepsis: Secondary | ICD-10-CM | POA: Diagnosis not present

## 2020-10-20 DIAGNOSIS — E039 Hypothyroidism, unspecified: Secondary | ICD-10-CM

## 2020-10-20 LAB — RESPIRATORY PANEL BY PCR

## 2020-10-20 LAB — COMPREHENSIVE METABOLIC PANEL
ALT: 18 U/L (ref 0–44)
AST: 30 U/L (ref 15–41)
Albumin: 2.9 g/dL — ABNORMAL LOW (ref 3.5–5.0)
Alkaline Phosphatase: 48 U/L (ref 38–126)
Anion gap: 10 (ref 5–15)
BUN: 15 mg/dL (ref 6–20)
CO2: 27 mmol/L (ref 22–32)
Calcium: 8.3 mg/dL — ABNORMAL LOW (ref 8.9–10.3)
Chloride: 103 mmol/L (ref 98–111)
Creatinine, Ser: 0.95 mg/dL (ref 0.61–1.24)
GFR, Estimated: >60 mL/min (ref >60)
Glucose, Bld: 114 mg/dL — ABNORMAL HIGH (ref 70–99)
Potassium: 3.6 mmol/L (ref 3.5–5.1)
Sodium: 140 mmol/L (ref 135–145)
Total Bilirubin: 0.2 mg/dL — ABNORMAL LOW (ref 0.3–1.2)
Total Protein: 6.4 g/dL — ABNORMAL LOW (ref 6.5–8.1)

## 2020-10-20 LAB — CORTISOL: Cortisol, Plasma: 12.4 ug/dL

## 2020-10-20 LAB — URINE CULTURE: Culture: <10000 — AB

## 2020-10-20 LAB — IRON AND TIBC
Iron: 11 ug/dL — ABNORMAL LOW (ref 45–182)
Saturation Ratios: 5 % — ABNORMAL LOW (ref 17.9–39.5)
TIBC: 206 ug/dL — ABNORMAL LOW (ref 250–450)
UIBC: 195 ug/dL

## 2020-10-20 LAB — CBC
HCT: 25.7 % — ABNORMAL LOW (ref 39.0–52.0)
Hemoglobin: 8.4 g/dL — ABNORMAL LOW (ref 13.0–17.0)
MCH: 31.5 pg (ref 26.0–34.0)
MCHC: 32.7 g/dL (ref 30.0–36.0)
MCV: 96.3 fL (ref 80.0–100.0)
Platelets: 176 10*3/uL (ref 150–400)
RBC: 2.67 MIL/uL — ABNORMAL LOW (ref 4.22–5.81)
RDW: 11.9 % (ref 11.5–15.5)
WBC: 5.5 10*3/uL (ref 4.0–10.5)
nRBC: 0 % (ref 0.0–0.2)

## 2020-10-20 LAB — GLUCOSE, CAPILLARY
Glucose-Capillary: 151 mg/dL — ABNORMAL HIGH (ref 70–99)
Glucose-Capillary: 110 mg/dL — ABNORMAL HIGH (ref 70–99)
Glucose-Capillary: 138 mg/dL — ABNORMAL HIGH (ref 70–99)
Glucose-Capillary: 118 mg/dL — ABNORMAL HIGH (ref 70–99)

## 2020-10-20 LAB — TROPONIN I (HIGH SENSITIVITY): Troponin I (High Sensitivity): 5 ng/L (ref <18)

## 2020-10-20 LAB — RETICULOCYTES
Immature Retic Fract: 18.3 % — ABNORMAL HIGH (ref 2.3–15.9)
RBC.: 2.67 MIL/uL — ABNORMAL LOW (ref 4.22–5.81)
Retic Count, Absolute: 24 10*3/uL (ref 19.0–186.0)
Retic Ct Pct: 0.9 % (ref 0.4–3.1)

## 2020-10-20 LAB — MAGNESIUM: Magnesium: 2.2 mg/dL (ref 1.7–2.4)

## 2020-10-20 LAB — VITAMIN B12: Vitamin B-12: 357 pg/mL (ref 180–914)

## 2020-10-20 LAB — FOLATE: Folate: 8 ng/mL (ref >5.9)

## 2020-10-20 LAB — PROCALCITONIN: Procalcitonin: 0.37 ng/mL

## 2020-10-20 LAB — TSH: TSH: 0.431 u[IU]/mL (ref 0.350–4.500)

## 2020-10-20 LAB — FERRITIN: Ferritin: 202 ng/mL (ref 24–336)

## 2020-10-20 IMAGING — DX DG CHEST 1V PORT
1 series · 1 of 1 positions shown · non-contrast
Comparison: [DATE] chest radiograph.

CLINICAL DATA: Follow-up pneumonia

EXAM:
PORTABLE CHEST 1 VIEW

[chest ap]
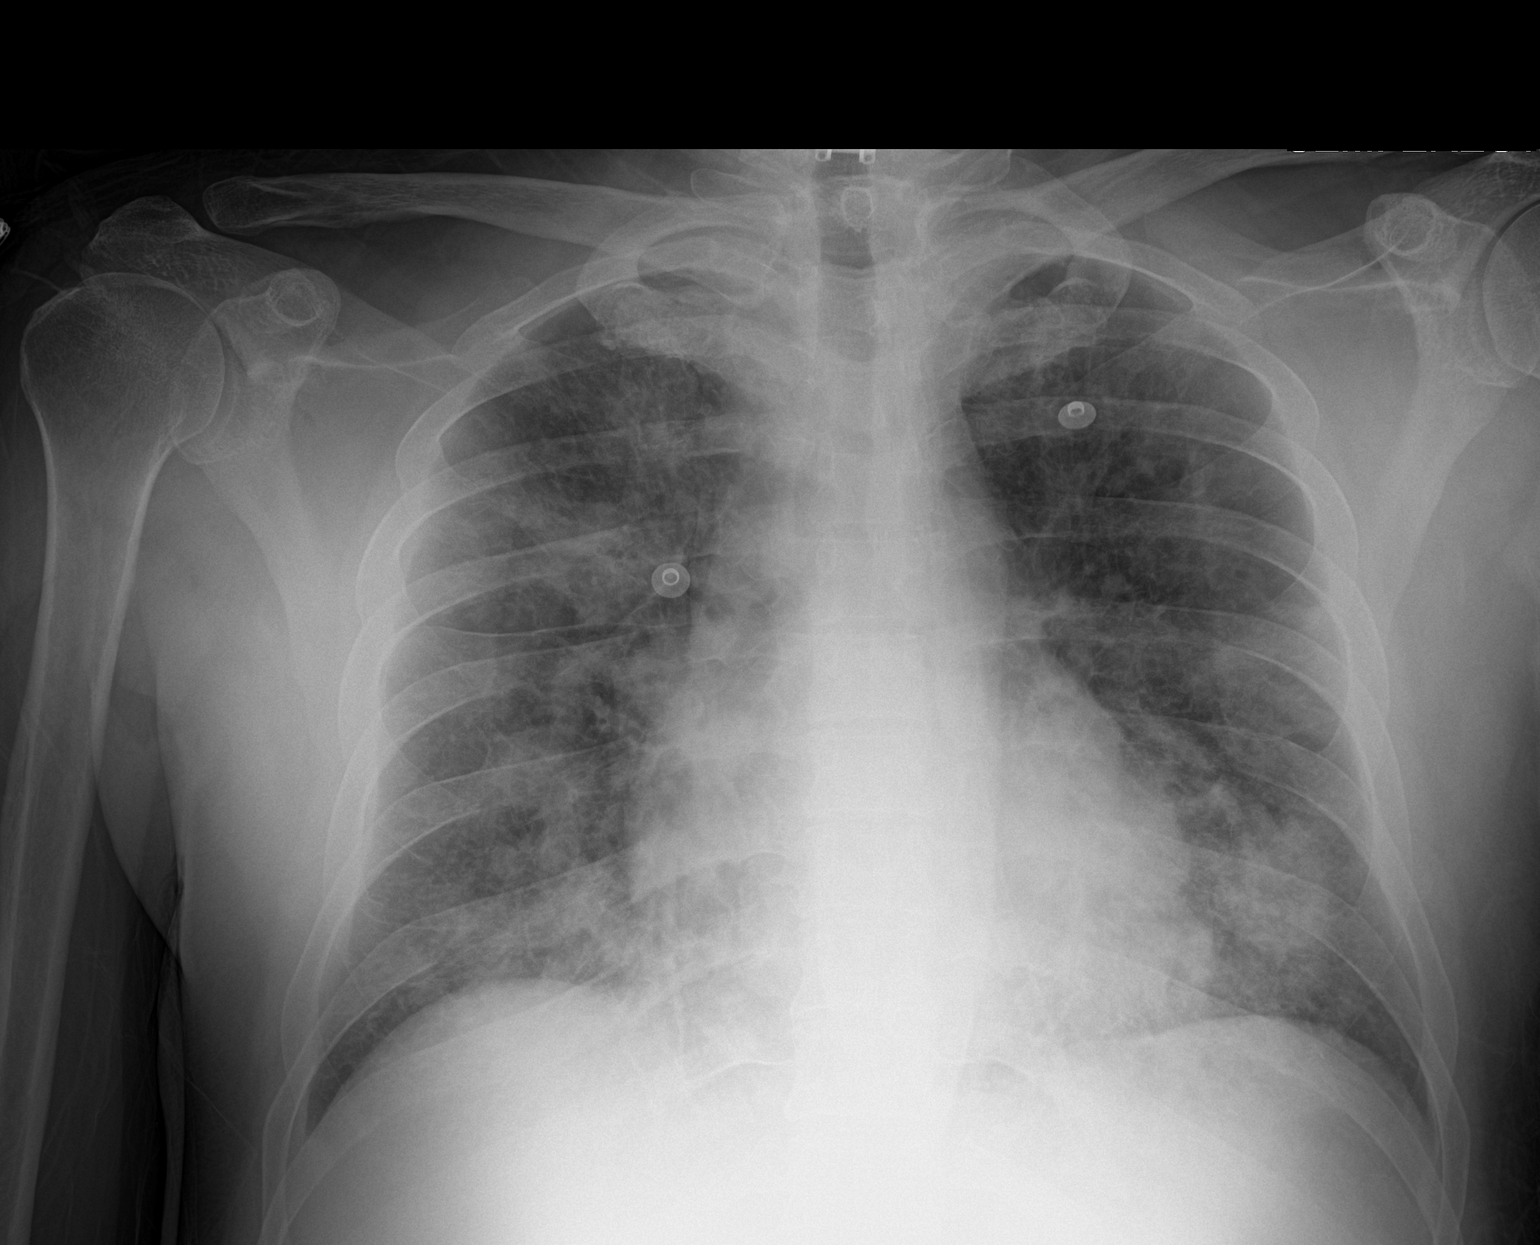

[1 of 1 positions shown; findings below may reference images not displayed]

FINDINGS: Surgical hardware overlies the lower cervical spine. Stable
cardiomediastinal silhouette with normal heart size. No
pneumothorax. No pleural effusion. Extensive patchy opacities
throughout both lungs, substantially worsened from prior chest
radiograph.
IMPRESSION: Extensive patchy opacities throughout both lungs, substantially
worsened from prior chest radiograph, compatible with multilobar
pneumonia.

## 2020-10-20 MED ORDER — ADULT MULTIVITAMIN W/MINERALS CH
1.0000 | ORAL_TABLET | Freq: Every day | ORAL | Status: DC
  Administered 2020-10-20 – 2020-10-24 (×5): 1 via ORAL
  Filled 2020-10-20 (×5): qty 1

## 2020-10-20 MED ORDER — CYANOCOBALAMIN 1000 MCG/ML IJ SOLN
1000.0000 ug | Freq: Every day | INTRAMUSCULAR | Status: AC
  Administered 2020-10-20 – 2020-10-21 (×2): 1000 ug via SUBCUTANEOUS
  Filled 2020-10-20 (×2): qty 1

## 2020-10-20 MED ORDER — SODIUM CHLORIDE 0.9 % IV SOLN
2.0000 g | INTRAVENOUS | Status: DC
  Administered 2020-10-20 – 2020-10-23 (×4): 2 g via INTRAVENOUS
  Filled 2020-10-20 (×2): qty 2
  Filled 2020-10-20: qty 20
  Filled 2020-10-20: qty 2
  Filled 2020-10-20: qty 20

## 2020-10-20 MED ORDER — AZITHROMYCIN 250 MG PO TABS
500.0000 mg | ORAL_TABLET | Freq: Every day | ORAL | Status: AC
  Administered 2020-10-21 – 2020-10-22 (×2): 500 mg via ORAL
  Filled 2020-10-20 (×2): qty 2

## 2020-10-20 MED ORDER — PROSOURCE PLUS PO LIQD
30.0000 mL | Freq: Two times a day (BID) | ORAL | Status: DC
  Administered 2020-10-20 – 2020-10-24 (×7): 30 mL via ORAL
  Filled 2020-10-20 (×7): qty 30

## 2020-10-20 NOTE — Progress Notes (Signed)
Initial Nutrition Assessment  DOCUMENTATION CODES:   Not applicable  INTERVENTION:  MVI with minerals daily  58ml Prosource Plus po BID, each supplement provides 100 kcals and 15 grams of protein  Continue Ensure Enlive po BID, each supplement provides 350 kcal and 20 grams of protein  NUTRITION DIAGNOSIS:   Predicted suboptimal nutrient intake related to decreased appetite as evidenced by other (comment) (per MST report).  GOAL:   Patient will meet greater than or equal to 90% of their needs  MONITOR:   PO intake,Supplement acceptance,I & O's,Labs,Weight trends  REASON FOR ASSESSMENT:   Malnutrition Screening Tool    ASSESSMENT:   Pt with a PMH significant for anxiety, depression, HTN, and dyslipidemia admitted with sepsis and acute hypoxic respiratory failure, suspected to be 2/2 PNA.   Pt unavailable at time of RD visit, receiving pt care. Per RN, pt was c/o night sweats overnight and required 6L [place] -- RN questioning whether TB should be ruled out.   No PO intake documented. Pt with order for Ensure BID and, per RN, was accepting of supplement this morning. Recommend continue current nutrition plan of care.  Reviewed weight history, no significant weight changes noted.   No UOP documented.   Medications: vitamin B12, SSI TID w/ meals Labs reviewed. CBGs 110-151  Diet Order:   Diet Order            Diet regular Room service appropriate? Yes; Fluid consistency: Thin  Diet effective now                 EDUCATION NEEDS:   No education needs have been identified at this time  Skin:  Skin Assessment: Reviewed RN Assessment  Last BM:  4/23  Height:   Ht Readings from Last 1 Encounters:  10/19/20 5\' 10"  (1.778 m)    Weight:   Wt Readings from Last 1 Encounters:  10/19/20 88.6 kg    BMI:  Body mass index is 28.03 kg/m.  Estimated Nutritional Needs:   Kcal:  2000-2200  Protein:  100-110 g  Fluid:  >/=2L/d    10/21/20, MS, RD,  LDN RD pager number and weekend/on-call pager number located in Amion.

## 2020-10-20 NOTE — Progress Notes (Signed)
Samuel Ewing  [id number]:[id number] DOB: 1971/03/15 DOA: 10/18/2020 PCP: Marcine Matar, MD    Brief Narrative:  [phone] w/ a hx of anxiety/depression, HTN, and HLD who presented to the ER 4/23 with dyspnea and fever for 3 days. He was COVID-negative x2. T-max at home was 102. In the ED he was found to have a temperature of 101.5 and a heart rate of 124.  Saturations were 88% on room air.  CTa of the chest revealed no evidence of PE but did note multifocal pulmonary infiltrates.  Consultants:  None  Code Status: FULL CODE  Antimicrobials:  Rocephin 4/23 > Azithromycin 4/23 >  DVT prophylaxis: Lovenox  Subjective: T-max 99.1 last 24 hours.  Requiring 6 L nasal cannula support to keep saturations at 92%.  Blood pressure marginal at 95-100 systolic.  Reports that he feels significantly dyspneic with even short ambulation across the room.  Denies chest pain nausea or vomiting.  Assessment & Plan:  Idiopathic Multifocal pneumonia with acute hypoxic respiratory failure - Sepsis POA Bilateral pulmonary infiltrates noted on CT chest but not appreciated on admit CXR - confirmed COVID-negative x2 - continue empiric antibiotic -supplemental oxygen as needed and wean as able, with patient presently still requiring 5 to 6 L nasal cannula support - sepsis physiology has resolved - check for atypical viral pneumonia - his persisting sx despite tx for bacterial pathogens is concerning   Hypokalemia Likely due to simple poor intake - cont to supplement and follow - magnesium normal  Normocytic anemia Unusual for an otherwise healthy 50yo male - Hgb continues to drop w/ hydration - no evidence of acute blood loss - anemia panel suggests "anemia of chronic disease" pattern w/ both Fe and TIBC very low - B12 also marginal at 357  Recent Labs  Lab 10/17/20 0353 10/18/20 2149 10/18/20 2208 10/19/20 0604 10/20/20 0529  HGB 11.5* 10.9* 11.2* 9.4* 8.4*    Borderline B12 deficiency  Goal is B12 of  400 - supplement SQ x2  Severe anxiety disorder with depression Continue usual home medications  Outpatient "cervical spine surgery" 09/26/20  Hyperglycemia Appears to have a history of this without being formally diagnosed with DM - A1c 6.4 therefore not yet true DM but very close   Essential HTN Not an active issue presently   Family Communication:  Status is: Inpatient  Remains inpatient appropriate because:Inpatient level of care appropriate due to severity of illness   Dispo: The patient is from: Home              Anticipated d/c is to: Home              Patient currently is not medically stable to d/c.   Difficult to place patient No  Objective: Blood pressure (!) 95/58, pulse 77, temperature 99.1 F (37.3 C), temperature source Oral, resp. rate 18, height 5\' 10"  (1.778 m), weight 88.6 kg, SpO2 92 %.  Intake/Output Summary (Last 24 hours) at 10/20/2020 10/22/2020 Last data filed at 10/20/2020 0400 Gross per 24 hour  Intake 400 ml  Output --  Net 400 ml   Filed Weights   10/18/20 2107 10/19/20 0258  Weight: 81.6 kg 88.6 kg    Examination: General: Appears mildly dyspneic during conversation even with oxygen in place but not in extremis Lungs: Faint bilateral crackles with no wheezing Cardiovascular: Regular rate and rhythm without murmur gallop or rub normal S1 and S2 Abdomen: Nontender, nondistended, soft, bowel sounds positive, no rebound, no ascites, no appreciable  mass Extremities: No significant cyanosis, clubbing, or edema bilateral lower extremities   CBC: Recent Labs  Lab 10/17/20 0353 10/18/20 2149 10/18/20 2208 10/19/20 0604 10/20/20 0529  WBC 11.0* 11.6*  --  8.3 5.5  NEUTROABS 9.2* 10.2*  --   --   --   HGB 11.5* 10.9* 11.2* 9.4* 8.4*  HCT 33.7* 31.1* 33.0* 28.0* 25.7*  MCV 93.6 92.6  --  95.9 96.3  PLT 217 216  --  158 176   Basic Metabolic Panel: Recent Labs  Lab 10/18/20 2149 10/18/20 2208 10/19/20 0604 10/20/20 0529  NA 129* 133* 135  140  K 3.5 3.6 3.4* 3.6  CL 93*  --  102 103  CO2 24  --  25 27  GLUCOSE 237*  --  155* 114*  BUN 18  --  13 15  CREATININE 0.93  --  0.78 0.95  CALCIUM 8.6*  --  8.0* 8.3*  MG  --   --  2.1 2.2  PHOS  --   --  2.0*  --    GFR: Estimated Creatinine Clearance: 105.4 mL/min (by C-G formula based on SCr of 0.95 mg/dL).  Liver Function Tests: Recent Labs  Lab 10/18/20 2149 10/19/20 0604 10/20/20 0529  AST 33 26 30  ALT 20 15 18   ALKPHOS 64 56 48  BILITOT 0.8 0.8 0.2*  PROT 7.7 6.6 6.4*  ALBUMIN 3.8 3.3* 2.9*    Cardiac Enzymes: Recent Labs  Lab 10/18/20 2149  CKTOTAL 189    HbA1C: Hgb A1c MFr Bld  Date/Time Value Ref Range Status  10/19/2020 06:04 AM 6.4 (H) 4.8 - 5.6 % Final    Comment:    (NOTE) Pre diabetes:          5.7%-6.4%  Diabetes:              >6.4%  Glycemic control for   <7.0% adults with diabetes     CBG: Recent Labs  Lab 10/19/20 0746 10/19/20 1141 10/19/20 1638 10/19/20 1958  GLUCAP 133* 130* 102* 153*    Recent Results (from the past 240 hour(s))  SARS CORONAVIRUS 2 (TAT 6-24 HRS) Nasopharyngeal Nasopharyngeal Swab     Status: None   Collection Time: 10/17/20  3:53 AM   Specimen: Nasopharyngeal Swab  Result Value Ref Range Status   SARS Coronavirus 2 NEGATIVE NEGATIVE Final    Comment: (NOTE) SARS-CoV-2 target nucleic acids are NOT DETECTED.  The SARS-CoV-2 RNA is generally detectable in upper and lower respiratory specimens during the acute phase of infection. Negative results do not preclude SARS-CoV-2 infection, do not rule out co-infections with other pathogens, and should not be used as the sole basis for treatment or other patient management decisions. Negative results must be combined with clinical observations, patient history, and epidemiological information. The expected result is Negative.  Fact Sheet for Patients: 10/19/20  Fact Sheet for Healthcare  Providers: HairSlick.no  This test is not yet approved or cleared by the quierodirigir.com FDA and  has been authorized for detection and/or diagnosis of SARS-CoV-2 by FDA under an Emergency Use Authorization (EUA). This EUA will remain  in effect (meaning this test can be used) for the duration of the COVID-19 declaration under Se ction 564(b)(1) of the Act, 21 U.S.C. section 360bbb-3(b)(1), unless the authorization is terminated or revoked sooner.  Performed at [facility], [address] [place], [place]   Resp Panel by RT-PCR (Flu A&B, Covid) Urine, Clean Catch  Status: None   Collection Time: 10/19/20 12:14 AM   Specimen: Urine, Clean Catch; Nasopharyngeal(NP) swabs in vial transport medium  Result Value Ref Range Status   SARS Coronavirus 2 by RT PCR NEGATIVE NEGATIVE Final    Comment: (NOTE) SARS-CoV-2 target nucleic acids are NOT DETECTED.  The SARS-CoV-2 RNA is generally detectable in upper respiratory specimens during the acute phase of infection. The lowest concentration of SARS-CoV-2 viral copies this assay can detect is 138 copies/mL. A negative result does not preclude SARS-Cov-2 infection and should not be used as the sole basis for treatment or other patient management decisions. A negative result may occur with  improper specimen collection/handling, submission of specimen other than nasopharyngeal swab, presence of viral mutation(s) within the areas targeted by this assay, and inadequate number of viral copies(<138 copies/mL). A negative result must be combined with clinical observations, patient history, and epidemiological information. The expected result is Negative.  Fact Sheet for Patients:  BloggerCourse.com  Fact Sheet for Healthcare Providers:  SeriousBroker.it  This test is no t yet approved or cleared by the [place] FDA and  has been authorized for  detection and/or diagnosis of SARS-CoV-2 by FDA under an Emergency Use Authorization (EUA). This EUA will remain  in effect (meaning this test can be used) for the duration of the COVID-19 declaration under Section 564(b)(1) of the Act, 21 U.S.C.section 360bbb-3(b)(1), unless the authorization is terminated  or revoked sooner.       Influenza A by PCR NEGATIVE NEGATIVE Final   Influenza B by PCR NEGATIVE NEGATIVE Final    Comment: (NOTE) The Xpert Xpress SARS-CoV-2/FLU/RSV plus assay is intended as an aid in the diagnosis of influenza from Nasopharyngeal swab specimens and should not be used as a sole basis for treatment. Nasal washings and aspirates are unacceptable for Xpert Xpress SARS-CoV-2/FLU/RSV testing.  Fact Sheet for Patients: BloggerCourse.com  Fact Sheet for Healthcare Providers: SeriousBroker.it  This test is not yet approved or cleared by the [place] FDA and has been authorized for detection and/or diagnosis of SARS-CoV-2 by FDA under an Emergency Use Authorization (EUA). This EUA will remain in effect (meaning this test can be used) for the duration of the COVID-19 declaration under Section 564(b)(1) of the Act, 21 U.S.C. section 360bbb-3(b)(1), unless the authorization is terminated or revoked.  Performed at [facility], [address] [place], [place] [address]      Scheduled Meds: . atorvastatin  20 mg Oral Daily  . azithromycin  500 mg Oral Daily  . busPIRone  30 mg Oral BID  . dextromethorphan-guaiFENesin  1 tablet Oral BID  . DULoxetine  60 mg Oral BID  . enoxaparin (LOVENOX) injection  40 mg Subcutaneous Q24H  . feeding supplement  237 mL Oral BID BM  . gabapentin  600 mg Oral BID  . insulin aspart  0-9 Units Subcutaneous TID WC  . levothyroxine  50 mcg Oral Q0600  . meloxicam  15 mg Oral Daily  . ziprasidone  40 mg Oral BID WC   Continuous Infusions: . cefTRIAXone (ROCEPHIN)   IV 1 g (10/19/20 2204)     LOS: 1 day   Lonia Blood, MD [facility] Office  [phone] Pager - Text Page per Amion  If 7PM-7AM, please contact night-coverage per Amion 10/20/2020, 8:12 AM

## 2020-10-20 NOTE — Progress Notes (Signed)
RT was called to give a breathing tx. RT entered the room and the Pt was asleep and on 7L [place] and his SATS was 82%. When the Pt woke up his SATS went to 96-88%. RT turned the O2 down to 6l because the Pt was on a small  water bottle. RT hooked up a SALTER and left it on 6L and came back after treating another Pt. The Pt was 85% asleep. RT increased his O2 to 8L [place] and he was awake and his SATS were 88- 93%. The coughed some while RT was in the room. RT asked the nurse if she could get the Pt some cough medicine. RT will continue to monitor the Pt.

## 2020-10-21 LAB — GLUCOSE, CAPILLARY
Glucose-Capillary: 113 mg/dL — ABNORMAL HIGH (ref 70–99)
Glucose-Capillary: 146 mg/dL — ABNORMAL HIGH (ref 70–99)
Glucose-Capillary: 131 mg/dL — ABNORMAL HIGH (ref 70–99)
Glucose-Capillary: 215 mg/dL — ABNORMAL HIGH (ref 70–99)

## 2020-10-21 LAB — CBC
HCT: 27.8 % — ABNORMAL LOW (ref 39.0–52.0)
Hemoglobin: 9.3 g/dL — ABNORMAL LOW (ref 13.0–17.0)
MCH: 31.6 pg (ref 26.0–34.0)
MCHC: 33.5 g/dL (ref 30.0–36.0)
MCV: 94.6 fL (ref 80.0–100.0)
Platelets: 226 10*3/uL (ref 150–400)
RBC: 2.94 MIL/uL — ABNORMAL LOW (ref 4.22–5.81)
RDW: 11.9 % (ref 11.5–15.5)
WBC: 7.7 10*3/uL (ref 4.0–10.5)
nRBC: 0.3 % — ABNORMAL HIGH (ref 0.0–0.2)

## 2020-10-21 LAB — BASIC METABOLIC PANEL
Anion gap: 9 (ref 5–15)
BUN: 16 mg/dL (ref 6–20)
CO2: 26 mmol/L (ref 22–32)
Calcium: 8.1 mg/dL — ABNORMAL LOW (ref 8.9–10.3)
Chloride: 98 mmol/L (ref 98–111)
Creatinine, Ser: 0.92 mg/dL (ref 0.61–1.24)
GFR, Estimated: >60 mL/min (ref >60)
Glucose, Bld: 120 mg/dL — ABNORMAL HIGH (ref 70–99)
Potassium: 3.8 mmol/L (ref 3.5–5.1)
Sodium: 133 mmol/L — ABNORMAL LOW (ref 135–145)

## 2020-10-21 LAB — PROCALCITONIN: Procalcitonin: 0.51 ng/mL

## 2020-10-21 MED ORDER — METHYLPREDNISOLONE SODIUM SUCC 125 MG IJ SOLR
60.0000 mg | Freq: Every day | INTRAMUSCULAR | Status: AC
  Administered 2020-10-21 – 2020-10-23 (×3): 60 mg via INTRAVENOUS
  Filled 2020-10-21 (×3): qty 2

## 2020-10-21 NOTE — Progress Notes (Signed)
Samuel AuerbachMatthew S Ewing  EXB:[id number] DOB: 11/02/1970 DOA: 10/18/2020 PCP: Samuel Ewing, Samuel B, MD    Brief Narrative:  [phone] w/ a hx of anxiety/depression, HTN, and HLD who presented to the ER 4/23 with dyspnea and fever for 3 days. He was COVID-negative x2. T-max at home was 102. In the ED he was found to have a temperature of 101.5 and a heart rate of 124.  Saturations were 88% on room air.  CTa of the chest revealed no evidence of PE but did note multifocal pulmonary infiltrates.  Consultants:  None  Code Status: FULL CODE  Antimicrobials:  Rocephin 4/23 >  Azithromycin 4/23 >  DVT prophylaxis: Lovenox  Subjective: Elevated temperature persist with T-max of 100.8 over last 24 hours.  Saturations 93% on 6 L salter high flow support.  Vitals otherwise stable.  Viral respiratory panel positive for parainfluenza virus 4.  No new complaints.  Patient states his breathing is improving somewhat.  He continues to require high level oxygen support.  He feels that nebulizer therapy is assisting him some.  Assessment & Plan:  Parainfluenza virus multifocal pneumonia with acute hypoxic respiratory failure - Sepsis POA signif bilateral pulmonary infiltrates noted on CT chest but not appreciated on admit CXR - confirmed COVID-negative x2 - continue empiric antibiotic to complete a 5-day course but will discontinue thereafter - supplemental oxygen as needed and wean as able, with patient presently still requiring 6 L [place] nasal cannula support - sepsis physiology has resolved - short course of steroid in hopes of improving pneumonitis  Hypokalemia Likely due to simple poor intake -corrected with supplementation -magnesium normal  Normocytic anemia Unusual for an otherwise healthy 50yo male - Hgb dropped with hydration but is now stabilizing- no evidence of acute blood loss - anemia panel suggests "anemia of chronic disease" pattern w/ both Fe and TIBC very low - B12 also marginal at 357 -suspect this  is a consequence of his prolonged respiratory disease as well as his recent surgery -will need to be monitored in the outpatient setting in follow-up  Recent Labs  Lab 10/18/20 2149 10/18/20 2208 10/19/20 0604 10/20/20 0529 10/21/20 0503  HGB 10.9* 11.2* 9.4* 8.4* 9.3*    Borderline B12 deficiency  Goal is B12 of 400 - supplemented SQ x2  Severe anxiety disorder with depression Continue usual home medications -well compensated at present  Outpatient "cervical spine surgery" 09/26/20 No apparent acute complications  Hyperglycemia Appears to have a history of this without being formally diagnosed with DM - A1c 6.4 therefore not yet true DM but very close -follow CBGs with a short steroid course  Essential HTN Not an active issue presently   Family Communication:  Status is: Inpatient  Remains inpatient appropriate because:Inpatient level of care appropriate due to severity of illness   Dispo: The patient is from: Home              Anticipated d/c is to: Home              Patient currently is not medically stable to d/c.   Difficult to place patient No  Objective: Blood pressure 133/73, pulse (!) 103, temperature (!) 100.8 F (38.2 C), temperature source Oral, resp. rate 20, height 5\' 10"  (1.778 m), weight 88.6 kg, SpO2 93 %.  Intake/Output Summary (Last 24 hours) at 10/21/2020 0841 Last data filed at 10/21/2020 0500 Gross per 24 hour  Intake 100 ml  Output 300 ml  Net -200 ml   [employer] PowerFiled Weights  10/18/20 2107 10/19/20 0258  Weight: 81.6 kg 88.6 kg    Examination: General: Somewhat less dyspneic today -not in extremis Lungs: Faint bilateral crackles persist bilaterally with no wheezing Cardiovascular: RRR without murmur Abdomen: NT/ND, soft, BS positive, no rebound Extremities: No significant edema bilateral lower extremities   CBC: Recent Labs  Lab 10/17/20 0353 10/18/20 2149 10/18/20 2208 10/19/20 0604 10/20/20 0529 10/21/20 0503  WBC 11.0* 11.6*  --   8.3 5.5 7.7  NEUTROABS 9.2* 10.2*  --   --   --   --   HGB 11.5* 10.9*   < > 9.4* 8.4* 9.3*  HCT 33.7* 31.1*   < > 28.0* 25.7* 27.8*  MCV 93.6 92.6  --  95.9 96.3 94.6  PLT 217 216  --  158 176 226   < > = values in this interval not displayed.   Basic Metabolic Panel: Recent Labs  Lab 10/19/20 0604 10/20/20 0529 10/21/20 0503  NA 135 140 133*  K 3.4* 3.6 3.8  CL 102 103 98  CO2 25 27 26   GLUCOSE 155* 114* 120*  BUN 13 15 16   CREATININE 0.78 0.95 0.92  CALCIUM 8.0* 8.3* 8.1*  MG 2.1 2.2  --   PHOS 2.0*  --   --    GFR: Estimated Creatinine Clearance: 108.8 mL/min (by C-G formula based on SCr of 0.92 mg/dL).  Liver Function Tests: Recent Labs  Lab 10/18/20 2149 10/19/20 0604 10/20/20 0529  AST 33 26 30  ALT 20 15 18   ALKPHOS 64 56 48  BILITOT 0.8 0.8 0.2*  PROT 7.7 6.6 6.4*  ALBUMIN 3.8 3.3* 2.9*    Cardiac Enzymes: Recent Labs  Lab 10/18/20 2149  CKTOTAL 189    HbA1C: Hgb A1c MFr Bld  Date/Time Value Ref Range Status  10/19/2020 06:04 AM 6.4 (H) 4.8 - 5.6 % Final    Comment:    (NOTE) Pre diabetes:          5.7%-6.4%  Diabetes:              >6.4%  Glycemic control for   <7.0% adults with diabetes     CBG: Recent Labs  Lab 10/20/20 0945 10/20/20 1229 10/20/20 1644 10/20/20 2031 10/21/20 0753  GLUCAP 151* 110* 138* 118* 113*    Recent Results (from the past 240 hour(s))  SARS CORONAVIRUS 2 (TAT 6-24 HRS) Nasopharyngeal Nasopharyngeal Swab     Status: None   Collection Time: 10/17/20  3:53 AM   Specimen: Nasopharyngeal Swab  Result Value Ref Range Status   SARS Coronavirus 2 NEGATIVE NEGATIVE Final    Comment: (NOTE) SARS-CoV-2 target nucleic acids are NOT DETECTED.  The SARS-CoV-2 RNA is generally detectable in upper and lower respiratory specimens during the acute phase of infection. Negative results do not preclude SARS-CoV-2 infection, do not rule out co-infections with other pathogens, and should not be used as the sole basis  for treatment or other patient management decisions. Negative results must be combined with clinical observations, patient history, and epidemiological information. The expected result is Negative.  Fact Sheet for Patients: 2032  Fact Sheet for Healthcare Providers: 10/23/20  This test is not yet approved or cleared by the 10/19/20 FDA and  has been authorized for detection and/or diagnosis of SARS-CoV-2 by FDA under an Emergency Use Authorization (EUA). This EUA will remain  in effect (meaning this test can be used) for the duration of the COVID-19 declaration under Se ction 564(b)(1) of the Act, 21 U.S.C.  section 360bbb-3(b)(1), unless the authorization is terminated or revoked sooner.  Performed at [facility], [address] [place], [place] [address]   Urine Culture     Status: Abnormal   Collection Time: 10/19/20 12:14 AM   Specimen: Urine, Clean Catch  Result Value Ref Range Status   Specimen Description   Final    URINE, CLEAN CATCH Performed at [facility], [address] [place], [place] [address]    Special Requests   Final    NONE Performed at [facility], [address] [place], [place] [address]    Culture (A)  Final    <10,000 COLONIES/mL INSIGNIFICANT GROWTH Performed at [facility], [address], [place], [place] [address]    Report Status 10/20/2020 FINAL  Final  Resp Panel by RT-PCR (Flu A&B, Covid) Urine, Clean Catch     Status: None   Collection Time: 10/19/20 12:14 AM   Specimen: Urine, Clean Catch; Nasopharyngeal(NP) swabs in vial transport medium  Result Value Ref Range Status   SARS Coronavirus 2 by RT PCR NEGATIVE NEGATIVE Final    Comment: (NOTE) SARS-CoV-2 target nucleic acids are NOT DETECTED.  The SARS-CoV-2 RNA is generally detectable in upper respiratory specimens during the acute phase of infection. The  lowest concentration of SARS-CoV-2 viral copies this assay can detect is 138 copies/mL. A negative result does not preclude SARS-Cov-2 infection and should not be used as the sole basis for treatment or other patient management decisions. A negative result may occur with  improper specimen collection/handling, submission of specimen other than nasopharyngeal swab, presence of viral mutation(s) within the areas targeted by this assay, and inadequate number of viral copies(<138 copies/mL). A negative result must be combined with clinical observations, patient history, and epidemiological information. The expected result is Negative.  Fact Sheet for Patients:  BloggerCourse.com  Fact Sheet for Healthcare Providers:  SeriousBroker.it  This test is no t yet approved or cleared by the [place] FDA and  has been authorized for detection and/or diagnosis of SARS-CoV-2 by FDA under an Emergency Use Authorization (EUA). This EUA will remain  in effect (meaning this test can be used) for the duration of the COVID-19 declaration under Section 564(b)(1) of the Act, 21 U.S.C.section 360bbb-3(b)(1), unless the authorization is terminated  or revoked sooner.       Influenza A by PCR NEGATIVE NEGATIVE Final   Influenza B by PCR NEGATIVE NEGATIVE Final    Comment: (NOTE) The Xpert Xpress SARS-CoV-2/FLU/RSV plus assay is intended as an aid in the diagnosis of influenza from Nasopharyngeal swab specimens and should not be used as a sole basis for treatment. Nasal washings and aspirates are unacceptable for Xpert Xpress SARS-CoV-2/FLU/RSV testing.  Fact Sheet for Patients: BloggerCourse.com  Fact Sheet for Healthcare Providers: SeriousBroker.it  This test is not yet approved or cleared by the [place] FDA and has been authorized for detection and/or diagnosis of SARS-CoV-2 by FDA under  an Emergency Use Authorization (EUA). This EUA will remain in effect (meaning this test can be used) for the duration of the COVID-19 declaration under Section 564(b)(1) of the Act, 21 U.S.C. section 360bbb-3(b)(1), unless the authorization is terminated or revoked.  Performed at [facility], [address] [place], [place] [address]   Respiratory (~20 pathogens) panel by PCR     Status: Abnormal   Collection Time: 10/20/20  6:08 PM   Specimen: Nasopharyngeal Swab; Respiratory  Result  Value Ref Range Status   Adenovirus NOT DETECTED NOT DETECTED Final   Coronavirus 229E NOT DETECTED NOT DETECTED Final    Comment: (NOTE) The Coronavirus on the Respiratory Panel, DOES NOT test for the novel  Coronavirus (2019 nCoV)    Coronavirus HKU1 NOT DETECTED NOT DETECTED Final   Coronavirus NL63 NOT DETECTED NOT DETECTED Final   Coronavirus OC43 NOT DETECTED NOT DETECTED Final   Metapneumovirus NOT DETECTED NOT DETECTED Final   Rhinovirus / Enterovirus NOT DETECTED NOT DETECTED Final   Influenza A NOT DETECTED NOT DETECTED Final   Influenza B NOT DETECTED NOT DETECTED Final   Parainfluenza Virus 1 NOT DETECTED NOT DETECTED Final   Parainfluenza Virus 2 NOT DETECTED NOT DETECTED Final   Parainfluenza Virus 3 NOT DETECTED NOT DETECTED Final   Parainfluenza Virus 4 DETECTED (A) NOT DETECTED Final   Respiratory Syncytial Virus NOT DETECTED NOT DETECTED Final   Bordetella pertussis NOT DETECTED NOT DETECTED Final   Bordetella Parapertussis NOT DETECTED NOT DETECTED Final   Chlamydophila pneumoniae NOT DETECTED NOT DETECTED Final   Mycoplasma pneumoniae NOT DETECTED NOT DETECTED Final    Comment: Performed at [facility], [address], [place], [place] [address]     Scheduled Meds: . (feeding supplement) PROSource Plus  30 mL Oral BID BM  . atorvastatin  20 mg Oral Daily  . azithromycin  500 mg Oral Daily  . busPIRone  30 mg Oral BID  . dextromethorphan-guaiFENesin  1  tablet Oral BID  . DULoxetine  60 mg Oral BID  . enoxaparin (LOVENOX) injection  40 mg Subcutaneous Q24H  . feeding supplement  237 mL Oral BID BM  . gabapentin  600 mg Oral BID  . insulin aspart  0-9 Units Subcutaneous TID WC  . levothyroxine  50 mcg Oral Q0600  . meloxicam  15 mg Oral Daily  . multivitamin with minerals  1 tablet Oral Daily  . ziprasidone  40 mg Oral BID WC   Continuous Infusions: . cefTRIAXone (ROCEPHIN)  IV Stopped (10/20/20 1922)     LOS: 2 days   Lonia Blood, MD [facility] Office  [phone] Pager - Text Page per Amion  If 7PM-7AM, please contact night-coverage per Amion 10/21/2020, 8:41 AM

## 2020-10-21 NOTE — Progress Notes (Signed)
   10/21/20 0643  Assess: MEWS Score  Temp (!) 100.8 F (38.2 C)  BP 133/73  Pulse Rate (!) 103  Resp 20  SpO2 93 %  Assess: MEWS Score  MEWS Temp 1  MEWS Systolic 0  MEWS Pulse 1  MEWS RR 0  MEWS LOC 0  MEWS Score 2  MEWS Score Color Yellow  Assess: if the MEWS score is Yellow or Red  Were vital signs taken at a resting state? Yes  Focused Assessment No change from prior assessment  Early Detection of Sepsis Score *See Row Information* High  MEWS guidelines implemented *See Row Information* Yes  Treat  MEWS Interventions Escalated (See documentation below)  Take Vital Signs  Increase Vital Sign Frequency  Yellow: Q 2hr X 2 then Q 4hr X 2, if remains yellow, continue Q 4hrs  Escalate  MEWS: Escalate Yellow: discuss with charge nurse/RN and consider discussing with provider and RRT  Notify: Charge Nurse/RN  Name of Charge Nurse/RN Notified Pam RN CN at 6:55 AM  Date Charge Nurse/RN Notified 10/21/20  Time Charge Nurse/RN Notified [phone]

## 2020-10-22 DIAGNOSIS — R5381 Other malaise: Secondary | ICD-10-CM

## 2020-10-22 LAB — GLUCOSE, CAPILLARY
Glucose-Capillary: 151 mg/dL — ABNORMAL HIGH (ref 70–99)
Glucose-Capillary: 272 mg/dL — ABNORMAL HIGH (ref 70–99)
Glucose-Capillary: 272 mg/dL — ABNORMAL HIGH (ref 70–99)
Glucose-Capillary: 253 mg/dL — ABNORMAL HIGH (ref 70–99)

## 2020-10-22 LAB — BASIC METABOLIC PANEL
Anion gap: 8 (ref 5–15)
BUN: 20 mg/dL (ref 6–20)
CO2: 28 mmol/L (ref 22–32)
Calcium: 8.8 mg/dL — ABNORMAL LOW (ref 8.9–10.3)
Chloride: 104 mmol/L (ref 98–111)
Creatinine, Ser: 0.73 mg/dL (ref 0.61–1.24)
GFR, Estimated: >60 mL/min (ref >60)
Glucose, Bld: 175 mg/dL — ABNORMAL HIGH (ref 70–99)
Potassium: 4 mmol/L (ref 3.5–5.1)
Sodium: 140 mmol/L (ref 135–145)

## 2020-10-22 LAB — PROCALCITONIN: Procalcitonin: 0.38 ng/mL

## 2020-10-22 MED ORDER — LORAZEPAM 1 MG PO TABS
1.0000 mg | ORAL_TABLET | Freq: Two times a day (BID) | ORAL | Status: DC | PRN
  Administered 2020-10-22 – 2020-10-24 (×5): 1 mg via ORAL
  Filled 2020-10-22 (×5): qty 1

## 2020-10-22 MED ORDER — INSULIN ASPART 100 UNIT/ML ~~LOC~~ SOLN
0.0000 [IU] | Freq: Three times a day (TID) | SUBCUTANEOUS | Status: DC
Start: 2020-10-23 — End: 2020-10-22

## 2020-10-22 MED ORDER — INSULIN ASPART 100 UNIT/ML ~~LOC~~ SOLN
0.0000 [IU] | Freq: Every day | SUBCUTANEOUS | Status: DC
  Administered 2020-10-22: 3 [IU] via SUBCUTANEOUS

## 2020-10-22 MED ORDER — OXYCODONE HCL 5 MG PO TABS
10.0000 mg | ORAL_TABLET | Freq: Once | ORAL | Status: AC | PRN
  Administered 2020-10-22: 10 mg via ORAL
  Filled 2020-10-22: qty 2

## 2020-10-22 NOTE — Assessment & Plan Note (Addendum)
-   Patient rather anxious at baseline.  Takes BuSpar at home but has worsened during hospitalization likely in setting of underlying acute illness as well as initiation of steroids - Continue BuSpar and Cymbalta -Required increased doses of Ativan during hospitalization but discharged back on home dose

## 2020-10-22 NOTE — Plan of Care (Signed)
  Problem: Clinical Measurements: Goal: Respiratory complications will improve Outcome: Progressing   Problem: Pain Managment: Goal: General experience of comfort will improve Outcome: Progressing   Problem: Education: Goal: Knowledge of General Education information will improve Description: Including pain rating scale, medication(s)/side effects and non-pharmacologic comfort measures Outcome: Completed/Met   Problem: Nutrition: Goal: Adequate nutrition will be maintained Outcome: Completed/Met

## 2020-10-22 NOTE — Assessment & Plan Note (Signed)
-   see PNA - weaning O2 as able; not on home O2 at baseline

## 2020-10-22 NOTE — Assessment & Plan Note (Signed)
-  Continue Lipitor °

## 2020-10-22 NOTE — Assessment & Plan Note (Addendum)
-   PT eval; home health PT ordered

## 2020-10-22 NOTE — Assessment & Plan Note (Signed)
-   Currently controlled without medicine

## 2020-10-22 NOTE — Hospital Course (Addendum)
Samuel Ewing is a 50 year old male with PMH hypertension, hyperlipidemia, anxiety/depression who presented to the hospital on 10/19/2020 with shortness of breath, fever, cough.  There were no prior reported sick contacts. He underwent work-up on admission with CXR and CTA chest.  This was negative for PE but showed bilateral multifocal pneumonia with groundglass opacities.  COVID-19 and flu swabs were negative on admission.  RVP was obtained and was positive for parainfluenza virus.  He is not on oxygen at home and had increasing oxygen requirements during hospitalization.  He was started on IV steroids due to poor improvement on 10/21/2020. He had good clinical improvement after starting steroids and oxygen was able to be weaned further.  He underwent walk testing prior to discharge and was deemed to require 2 L nasal cannula oxygen mostly with exertion. He completed antibiotic courses during hospitalization and was discharged with prednisone to complete his course and given an albuterol inhaler for any shortness of breath. He will need a repeat CXR in 4 to 6 weeks for resolution.

## 2020-10-22 NOTE — Evaluation (Signed)
Occupational Therapy Evaluation Patient Details Name: Samuel Ewing MRN: [id number] DOB: 08-Jun-1971 Today's Date: 10/22/2020    History of Present Illness 50yo w/ a hx of anxiety/depression, HTN, and HLD who presented to the ER 4/23 with dyspnea and fever for 3 days. He was COVID-negative x2. CTa of the chest revealed no evidence of PE but did note multifocal pulmonary infiltrates.   Clinical Impression   Mr. Fenris Cauble is a 50 year old man who presents with above medical history and s/p cervical ACDF approximately a month ago. On evaluation patient exhibits generalized weakness and decreased activity tolerance and on 6 L salter [place]. Patient required overall min guard and supervision during evaluation. Patient reports overall feelings of weakness and fatigue but functional. Patient's o2 sat 99% on 6 L. Therapist decreased oxygen liters consistently until patient on room air at 94%. Patient ambulated approximately 20 feet on RA and o2 sat 89% with patient exhibited mild dyspnea.  replaced at 2 L to maintain goal of 92% or higher. Do not expect patient will need OT services at discharge. Will follow acutely to advance abilities to modified independence in order to return home at discharge.     Follow Up Recommendations  No OT follow up    Equipment Recommendations  Tub/shower seat    Recommendations for Other Services       Precautions / Restrictions Precautions Precautions: Other (comment) Precaution Comments: monitor O2 sats Restrictions Weight Bearing Restrictions: No      Mobility Bed Mobility Overal bed mobility: Modified Independent                  Transfers Overall transfer level: Needs assistance Equipment used: None Transfers: Sit to/from Stand;Stand Pivot Transfers Sit to Stand: Min guard Stand pivot transfers: Min guard            Balance Overall balance assessment: Mild deficits observed, not formally tested                                          ADL either performed or assessed with clinical judgement   ADL Overall ADL's : Needs assistance/impaired Eating/Feeding: Independent   Grooming: Set up;Sitting   Upper Body Bathing: Sitting;Set up   Lower Body Bathing: Sit to/from stand;Set up   Upper Body Dressing : Set up;Sitting   Lower Body Dressing: Set up;Sit to/from stand   Toilet Transfer: Supervision/safety;Ambulation   Toileting- Clothing Manipulation and Hygiene: Supervision/safety;Sit to/from stand               Vision Patient Visual Report: No change from baseline       Perception     Praxis      Pertinent Vitals/Pain Pain Assessment: No/denies pain     Hand Dominance Right   Extremity/Trunk Assessment Upper Extremity Assessment Upper Extremity Assessment: Overall WFL for tasks assessed RUE Deficits / Details: WFL ROM, 4/5 strength RUE Sensation: WNL RUE Coordination: WNL LUE Deficits / Details: WFL ROM, 4/5 strength LUE Sensation: WNL LUE Coordination: WNL   Lower Extremity Assessment Lower Extremity Assessment: Defer to PT evaluation   Cervical / Trunk Assessment Cervical / Trunk Assessment: Normal   Communication Communication Communication: No difficulties   Cognition Arousal/Alertness: Awake/alert Behavior During Therapy: WFL for tasks assessed/performed Overall Cognitive Status: Within Functional Limits for tasks assessed  General Comments       Exercises     Shoulder Instructions      Home Living Family/patient expects to be discharged to:: Private residence Living Arrangements: Alone   Type of Home: Apartment Home Access: Stairs to enter Entrance Stairs-Number of Steps: 2   Home Layout: One level     Bathroom Shower/Tub: Chief Strategy Officer: Standard     Home Equipment: None          Prior Functioning/Environment Level of Independence: Independent         Comments: works as a Geneticist, molecular at [employer]        OT Problem List: Decreased activity tolerance;Cardiopulmonary status limiting activity      OT Treatment/Interventions: Self-care/ADL training;Therapeutic exercise;DME and/or AE instruction;Therapeutic activities;Patient/family education    OT Goals(Current goals can be found in the care plan section) Acute Rehab OT Goals Patient Stated Goal: to improve strength and independence OT Goal Formulation: With patient Time For Goal Achievement: 11/05/20 Potential to Achieve Goals: Good  OT Frequency: Min 2X/week   Barriers to D/C:            Co-evaluation              AM-PAC OT "6 Clicks" Daily Activity     Outcome Measure Help from another person eating meals?: None Help from another person taking care of personal grooming?: A Little Help from another person toileting, which includes using toliet, bedpan, or urinal?: A Little Help from another person bathing (including washing, rinsing, drying)?: A Little Help from another person to put on and taking off regular upper body clothing?: A Little Help from another person to put on and taking off regular lower body clothing?: A Little 6 Click Score: 19   End of Session Equipment Utilized During Treatment: Gait belt;Oxygen Nurse Communication: Mobility status  Activity Tolerance: Patient tolerated treatment well Patient left: in chair;with call bell/phone within reach;with chair alarm set  OT Visit Diagnosis: Muscle weakness (generalized) (M62.81)                Time: 3086-5784 OT Time Calculation (min): 26 min Charges:  OT General Charges $OT Visit: 1 Visit OT Evaluation $OT Eval Low Complexity: 1 Low OT Treatments $Therapeutic Activity: 8-22 mins  Cliff Damiani, OTR/L Acute Care Rehab Services  Office [phone] Pager: [phone]   Kelli Churn 10/22/2020, 4:07 PM

## 2020-10-22 NOTE — Assessment & Plan Note (Signed)
-   Continue Synthroid -TSH 0.431 on 10/20/2020

## 2020-10-22 NOTE — Progress Notes (Signed)
Progress Note    DAI MCADAMS   YQM:[id number]  DOB: 1970/10/20  DOA: 10/18/2020     3  PCP: Marcine Matar, MD  CC: cough, SOB, fever  Hospital Course: Mr. Samuel Ewing is a 50 year old male with PMH hypertension, hyperlipidemia, anxiety/depression who presented to the hospital on 10/19/2020 with shortness of breath, fever, cough.  There were no prior reported sick contacts. He underwent work-up on admission with CXR and CTA chest.  This was negative for PE but showed bilateral multifocal pneumonia with groundglass opacities.  COVID-19 and flu swabs were negative on admission.  RVP was obtained and was positive for parainfluenza virus.  He is not on oxygen at home and had increasing oxygen requirements during hospitalization.  He was started on IV steroids due to poor improvement on 10/21/2020.   Interval History:  No events overnight.  Resting in bed in no distress.  Does appear slightly anxious.  Breathing is comfortable.  Does have some coughing but not excessive and is not short of breath. We discussed discharge plans likely in the next 2 to 3 days.  He is okay with going home with oxygen if needed.  ROS: Constitutional: negative for chills and fevers, Respiratory: positive for cough and dyspnea on exertion, Cardiovascular: negative for chest pain and Gastrointestinal: negative for abdominal pain  Assessment & Plan: Sepsis with acute hypoxic respiratory failure (HCC) - see PNA - weaning O2 as able; not on home O2 at baseline   Pneumonia of both lungs due to infectious organism - Bilateral groundglass opacities seen on CTA chest on admission - COVID and flu swabs negative - RVP positive for parainfluenza virus -Patient has had slow improvement during hospitalization.  - Solu-Medrol started on 10/21/2020 due to escalating O2 requirements -Has been able to be weaned some today.  Down to 6 L oxygen on salter high flow -Goal is weaning oxygen down to around 4 L nasal cannula  oxygen and discharging home with O2.  Patient amenable with plan  Physical deconditioning - PT eval  Prediabetes - A1c 6.4% on 10/19/2020 - Continue SSI and CBG monitoring -Elevated glucose expected in setting of steroid use as well  Hypothyroidism - Continue Synthroid -TSH 0.431 on 10/20/2020  Generalized anxiety disorder - Patient rather anxious at baseline.  Takes BuSpar at home but has worsened during hospitalization likely in setting of underlying acute illness as well as initiation of steroids - Increase Ativan further while hospitalized - Continue BuSpar and Cymbalta  Hyperlipidemia - Continue Lipitor  Essential hypertension - Currently controlled without medicine   Old records reviewed in assessment of this patient  Antimicrobials: Azithro 4/24 >> current Rocephin 4/24 >> 4/263  DVT prophylaxis: enoxaparin (LOVENOX) injection 40 mg Start: 10/19/20 1000   Code Status:   Code Status: Full Code Family Communication:   Disposition Plan: Status is: Inpatient  Remains inpatient appropriate because:Inpatient level of care appropriate due to severity of illness and Still on salter high flow this morning   Dispo:  Patient From: Home  Planned Disposition: Home  Medically stable for discharge: No    Risk of unplanned readmission score: Unplanned Admission- Pilot do not use: 33.23   Objective: Blood pressure 123/74, pulse 76, temperature 97.9 F (36.6 C), resp. rate 18, height 5\' 10"  (1.778 m), weight 88.6 kg, SpO2 92 %.  Examination: General appearance: alert, cooperative, no distress and anxious appearing Head: Normocephalic, without obvious abnormality, atraumatic Eyes: EOMI Lungs: Coarse breath sounds bilaterally, worse on right.  No wheezing Heart:  regular rate and rhythm and S1, S2 normal Abdomen: normal findings: bowel sounds normal and soft, non-tender Extremities: No edema Skin: mobility and turgor normal Neurologic: Grossly normal  Consultants:      Procedures:     Data Reviewed: I have personally reviewed following labs and imaging studies Results for orders placed or performed during the hospital encounter of 10/18/20 (from the past 24 hour(s))  Glucose, capillary     Status: Abnormal   Collection Time: 10/21/20  4:20 PM  Result Value Ref Range   Glucose-Capillary 131 (H) 70 - 99 mg/dL  Glucose, capillary     Status: Abnormal   Collection Time: 10/21/20  8:45 PM  Result Value Ref Range   Glucose-Capillary 215 (H) 70 - 99 mg/dL  Procalcitonin     Status: None   Collection Time: 10/22/20  4:29 AM  Result Value Ref Range   Procalcitonin 0.38 ng/mL  Basic metabolic panel     Status: Abnormal   Collection Time: 10/22/20  4:29 AM  Result Value Ref Range   Sodium 140 135 - 145 mmol/L   Potassium 4.0 3.5 - 5.1 mmol/L   Chloride 104 98 - 111 mmol/L   CO2 28 22 - 32 mmol/L   Glucose, Bld 175 (H) 70 - 99 mg/dL   BUN 20 6 - 20 mg/dL   Creatinine, Ser 3.55 0.61 - 1.24 mg/dL   Calcium 8.8 (L) 8.9 - 10.3 mg/dL   GFR, Estimated >73 >22 mL/min   Anion gap 8 5 - 15  Glucose, capillary     Status: Abnormal   Collection Time: 10/22/20  7:35 AM  Result Value Ref Range   Glucose-Capillary 151 (H) 70 - 99 mg/dL  Glucose, capillary     Status: Abnormal   Collection Time: 10/22/20 12:30 PM  Result Value Ref Range   Glucose-Capillary 272 (H) 70 - 99 mg/dL    Recent Results (from the past 240 hour(s))  SARS CORONAVIRUS 2 (TAT 6-24 HRS) Nasopharyngeal Nasopharyngeal Swab     Status: None   Collection Time: 10/17/20  3:53 AM   Specimen: Nasopharyngeal Swab  Result Value Ref Range Status   SARS Coronavirus 2 NEGATIVE NEGATIVE Final    Comment: (NOTE) SARS-CoV-2 target nucleic acids are NOT DETECTED.  The SARS-CoV-2 RNA is generally detectable in upper and lower respiratory specimens during the acute phase of infection. Negative results do not preclude SARS-CoV-2 infection, do not rule out co-infections with other pathogens, and  should not be used as the sole basis for treatment or other patient management decisions. Negative results must be combined with clinical observations, patient history, and epidemiological information. The expected result is Negative.  Fact Sheet for Patients: HairSlick.no  Fact Sheet for Healthcare Providers: quierodirigir.com  This test is not yet approved or cleared by the [place] FDA and  has been authorized for detection and/or diagnosis of SARS-CoV-2 by FDA under an Emergency Use Authorization (EUA). This EUA will remain  in effect (meaning this test can be used) for the duration of the COVID-19 declaration under Se ction 564(b)(1) of the Act, 21 U.S.C. section 360bbb-3(b)(1), unless the authorization is terminated or revoked sooner.  Performed at [facility], [address], [place], [place] [address]   Urine Culture     Status: Abnormal   Collection Time: 10/19/20 12:14 AM   Specimen: Urine, Clean Catch  Result Value Ref Range Status   Specimen Description   Final    URINE, CLEAN CATCH Performed at  [facility] [place], [address] [place], [place] [address]    Special Requests   Final    NONE Performed at [facility] [place], [address] [place], [place] [address]    Culture (A)  Final    <10,000 COLONIES/mL INSIGNIFICANT GROWTH Performed at [facility], [address] [place], [place] [address]    Report Status 10/20/2020 FINAL  Final  Resp Panel by RT-PCR (Flu A&B, Covid) Urine, Clean Catch     Status: None   Collection Time: 10/19/20 12:14 AM   Specimen: Urine, Clean Catch; Nasopharyngeal(NP) swabs in vial transport medium  Result Value Ref Range Status   SARS Coronavirus 2 by RT PCR NEGATIVE NEGATIVE Final    Comment: (NOTE) SARS-CoV-2 target nucleic acids are NOT DETECTED.  The SARS-CoV-2 RNA is generally detectable in upper respiratory specimens during the acute  phase of infection. The lowest concentration of SARS-CoV-2 viral copies this assay can detect is 138 copies/mL. A negative result does not preclude SARS-Cov-2 infection and should not be used as the sole basis for treatment or other patient management decisions. A negative result may occur with  improper specimen collection/handling, submission of specimen other than nasopharyngeal swab, presence of viral mutation(s) within the areas targeted by this assay, and inadequate number of viral copies(<138 copies/mL). A negative result must be combined with clinical observations, patient history, and epidemiological information. The expected result is Negative.  Fact Sheet for Patients:  BloggerCourse.comhttps://www.fda.gov/media/152166/download  Fact Sheet for Healthcare Providers:  SeriousBroker.ithttps://www.fda.gov/media/152162/download  This test is no t yet approved or cleared by the [place] FDA and  has been authorized for detection and/or diagnosis of SARS-CoV-2 by FDA under an Emergency Use Authorization (EUA). This EUA will remain  in effect (meaning this test can be used) for the duration of the COVID-19 declaration under Section 564(b)(1) of the Act, 21 U.S.C.section 360bbb-3(b)(1), unless the authorization is terminated  or revoked sooner.       Influenza A by PCR NEGATIVE NEGATIVE Final   Influenza B by PCR NEGATIVE NEGATIVE Final    Comment: (NOTE) The Xpert Xpress SARS-CoV-2/FLU/RSV plus assay is intended as an aid in the diagnosis of influenza from Nasopharyngeal swab specimens and should not be used as a sole basis for treatment. Nasal washings and aspirates are unacceptable for Xpert Xpress SARS-CoV-2/FLU/RSV testing.  Fact Sheet for Patients: BloggerCourse.comhttps://www.fda.gov/media/152166/download  Fact Sheet for Healthcare Providers: SeriousBroker.ithttps://www.fda.gov/media/152162/download  This test is not yet approved or cleared by the [place] FDA and has been authorized for detection and/or diagnosis of  SARS-CoV-2 by FDA under an Emergency Use Authorization (EUA). This EUA will remain in effect (meaning this test can be used) for the duration of the COVID-19 declaration under Section 564(b)(1) of the Act, 21 U.S.C. section 360bbb-3(b)(1), unless the authorization is terminated or revoked.  Performed at [facility] [place], [address] [place], [place] [address]   Respiratory (~20 pathogens) panel by PCR     Status: Abnormal   Collection Time: 10/20/20  6:08 PM   Specimen: Nasopharyngeal Swab; Respiratory  Result Value Ref Range Status   Adenovirus NOT DETECTED NOT DETECTED Final   Coronavirus 229E NOT DETECTED NOT DETECTED Final    Comment: (NOTE) The Coronavirus on the Respiratory Panel, DOES NOT test for the novel  Coronavirus (2019 nCoV)    Coronavirus HKU1 NOT DETECTED NOT DETECTED Final   Coronavirus NL63 NOT DETECTED NOT DETECTED Final   Coronavirus OC43 NOT DETECTED NOT  DETECTED Final   Metapneumovirus NOT DETECTED NOT DETECTED Final   Rhinovirus / Enterovirus NOT DETECTED NOT DETECTED Final   Influenza A NOT DETECTED NOT DETECTED Final   Influenza B NOT DETECTED NOT DETECTED Final   Parainfluenza Virus 1 NOT DETECTED NOT DETECTED Final   Parainfluenza Virus 2 NOT DETECTED NOT DETECTED Final   Parainfluenza Virus 3 NOT DETECTED NOT DETECTED Final   Parainfluenza Virus 4 DETECTED (A) NOT DETECTED Final   Respiratory Syncytial Virus NOT DETECTED NOT DETECTED Final   Bordetella pertussis NOT DETECTED NOT DETECTED Final   Bordetella Parapertussis NOT DETECTED NOT DETECTED Final   Chlamydophila pneumoniae NOT DETECTED NOT DETECTED Final   Mycoplasma pneumoniae NOT DETECTED NOT DETECTED Final    Comment: Performed at [facility], [address] [place], [place] [address]     Radiology Studies: No results found. DG Chest Port 1 View  Final Result    CT Angio Chest PE W and/or Wo Contrast  Final Result      Scheduled Meds: . (feeding supplement)  PROSource Plus  30 mL Oral BID BM  . atorvastatin  20 mg Oral Daily  . busPIRone  30 mg Oral BID  . dextromethorphan-guaiFENesin  1 tablet Oral BID  . DULoxetine  60 mg Oral BID  . enoxaparin (LOVENOX) injection  40 mg Subcutaneous Q24H  . feeding supplement  237 mL Oral BID BM  . gabapentin  600 mg Oral BID  . insulin aspart  0-9 Units Subcutaneous TID WC  . levothyroxine  50 mcg Oral Q0600  . meloxicam  15 mg Oral Daily  . methylPREDNISolone (SOLU-MEDROL) injection  60 mg Intravenous Daily  . multivitamin with minerals  1 tablet Oral Daily  . ziprasidone  40 mg Oral BID WC   PRN Meds: acetaminophen **OR** [DISCONTINUED] acetaminophen, albuterol, HYDROmorphone, hydrOXYzine, LORazepam, ondansetron **OR** ondansetron (ZOFRAN) IV Continuous Infusions: . cefTRIAXone (ROCEPHIN)  IV 2 g (10/21/20 1733)     LOS: 3 days  Time spent: Greater than 50% of the 35 minute visit was spent in counseling/coordination of care for the patient as laid out in the A&P.   Lewie Chamber, MD [facility] 10/22/2020, 3:03 PM

## 2020-10-22 NOTE — Assessment & Plan Note (Signed)
-   A1c 6.4% on 10/19/2020 - Continue SSI and CBG monitoring -Elevated glucose expected in setting of steroid use as well

## 2020-10-22 NOTE — Evaluation (Signed)
Physical Therapy Evaluation Patient Details Name: Samuel Ewing MRN: [id number] DOB: [birth year] Today's Date: 10/22/2020   History of Present Illness  50 year old male presented to the ER 4/23 with dyspnea and fever for 3 days. He was COVID-negative x2. CTA of the chest revealed no evidence of PE but did note multifocal pulmonary infiltrates.  Pt found to be positive for parainfluenza virus. PMHx of anxiety/depression, HTN, and HLD  Clinical Impression  Pt admitted with above diagnosis.  Pt currently with functional limitations due to the deficits listed below (see PT Problem List). Pt will benefit from skilled PT to increase their independence and safety with mobility to allow discharge to the venue listed below.  Pt assisted with ambulating in hallway and encouraged to ambulate with nursing staff during remainder of acute stay.  Pt required supplemental oxygen and would like therapy upon d/c home to improve overall strength and independence.  SATURATION QUALIFICATIONS: (This note is used to comply with regulatory documentation for home oxygen)  Patient Saturations on Room Air at Rest = 93%  Patient Saturations on Room Air while Ambulating = 87%  Patient Saturations on 2 Liters of oxygen while Ambulating = 91%  Please briefly explain why patient needs home oxygen: to improve oxygen saturations with physical activity such as ambulation     Follow Up Recommendations Home health PT    Equipment Recommendations  None recommended by PT    Recommendations for Other Services       Precautions / Restrictions Precautions Precautions: Fall Precaution Comments: monitor O2 sats Restrictions Weight Bearing Restrictions: No      Mobility  Bed Mobility Overal bed mobility: Modified Independent                  Transfers Overall transfer level: Needs assistance Equipment used: None Transfers: Sit to/from Stand Sit to Stand: Min guard Stand pivot transfers: Min guard           Ambulation/Gait Ambulation/Gait assistance: Min guard Gait Distance (Feet): 400 Feet Assistive device: None Gait Pattern/deviations: Step-through pattern;Decreased stride length     General Gait Details: initially unsteady and had LOB with head turn however self corrected with holding doorframe/wall, no further LOB with increased distance, SPO2 dropped to 87% on room air so applied 2L O2 [place]  Stairs            Wheelchair Mobility    Modified Rankin (Stroke Patients Only)       Balance Overall balance assessment: Mild deficits observed, not formally tested (pt reports mostly being in bed for 4 days - generalized weakness)                                           Pertinent Vitals/Pain Pain Assessment: No/denies pain    Home Living Family/patient expects to be discharged to:: Private residence Living Arrangements: Alone   Type of Home: Apartment Home Access: Stairs to enter   [employer] of Steps: 2 Home Layout: One level Home Equipment: None      Prior Function Level of Independence: Independent         Comments: works as a Geneticist, molecular at [employer]   Dominant Hand: Right    Extremity/Trunk Assessment   Upper Extremity Assessment Upper Extremity Assessment: Overall WFL for tasks assessed RUE Deficits / Details: WFL ROM, 4/5  strength RUE Sensation: WNL RUE Coordination: WNL LUE Deficits / Details: WFL ROM, 4/5 strength LUE Sensation: WNL LUE Coordination: WNL    Lower Extremity Assessment Lower Extremity Assessment: Generalized weakness    Cervical / Trunk Assessment Cervical / Trunk Assessment: Normal  Communication   Communication: No difficulties  Cognition Arousal/Alertness: Awake/alert Behavior During Therapy: WFL for tasks assessed/performed Overall Cognitive Status: Within Functional Limits for tasks assessed                                         General Comments      Exercises     Assessment/Plan    PT Assessment Patient needs continued PT services  PT Problem List Decreased activity tolerance;Cardiopulmonary status limiting activity;Decreased mobility;Decreased strength       PT Treatment Interventions Gait training;DME instruction;Therapeutic exercise;Balance training;Functional mobility training;Therapeutic activities;Patient/family education    PT Goals (Current goals can be found in the Care Plan section)  Acute Rehab PT Goals Patient Stated Goal: to improve strength and independence PT Goal Formulation: With patient Time For Goal Achievement: 11/05/20 Potential to Achieve Goals: Good    Frequency Min 3X/week   Barriers to discharge        Co-evaluation               AM-PAC PT "6 Clicks" Mobility  Outcome Measure Help needed turning from your back to your side while in a flat bed without using bedrails?: None Help needed moving from lying on your back to sitting on the side of a flat bed without using bedrails?: None Help needed moving to and from a bed to a chair (including a wheelchair)?: A Little Help needed standing up from a chair using your arms (e.g., wheelchair or bedside chair)?: A Little Help needed to walk in hospital room?: A Little Help needed climbing 3-5 steps with a railing? : A Little 6 Click Score: 20    End of Session Equipment Utilized During Treatment: Oxygen Activity Tolerance: Patient tolerated treatment well Patient left: in bed;with call bell/phone within reach Nurse Communication: Mobility status PT Visit Diagnosis: Difficulty in walking, not elsewhere classified (R26.2)    Time: 2956-2130 PT Time Calculation (min) (ACUTE ONLY): 22 min   Charges:   PT Evaluation $PT Eval Low Complexity: 1 Low     Kati PT, DPT Acute Rehabilitation Services Pager: [phone] Office: [phone]  Sarajane Jews 10/22/2020, 4:49 PM

## 2020-10-22 NOTE — Assessment & Plan Note (Addendum)
-   Bilateral groundglass opacities seen on CTA chest on admission - COVID and flu swabs negative - RVP positive for parainfluenza virus -Patient has had slow improvement during hospitalization. - Solu-Medrol started on 10/21/2020 due to escalating O2 requirements -O2 requirements have been dramatically improving since starting steroids - Weaned down to 2 L oxygen which he mostly needs with exertion; home O2 arranged at discharge -Antibiotic course considered completed at discharge - Albuterol inhaler and prednisone also prescribed at discharge

## 2020-10-23 LAB — GLUCOSE, CAPILLARY
Glucose-Capillary: 166 mg/dL — ABNORMAL HIGH (ref 70–99)
Glucose-Capillary: 214 mg/dL — ABNORMAL HIGH (ref 70–99)
Glucose-Capillary: 313 mg/dL — ABNORMAL HIGH (ref 70–99)
Glucose-Capillary: 177 mg/dL — ABNORMAL HIGH (ref 70–99)

## 2020-10-23 MED ORDER — METHYLPREDNISOLONE SODIUM SUCC 125 MG IJ SOLR
60.0000 mg | Freq: Every day | INTRAMUSCULAR | Status: DC
  Administered 2020-10-24: 60 mg via INTRAVENOUS
  Filled 2020-10-23: qty 2

## 2020-10-23 MED ORDER — LORAZEPAM 1 MG PO TABS
1.0000 mg | ORAL_TABLET | Freq: Once | ORAL | Status: AC
  Administered 2020-10-23: 1 mg via ORAL
  Filled 2020-10-23: qty 1

## 2020-10-23 MED ORDER — NICOTINE 21 MG/24HR TD PT24
21.0000 mg | MEDICATED_PATCH | Freq: Every day | TRANSDERMAL | Status: DC
  Administered 2020-10-23 – 2020-10-24 (×2): 21 mg via TRANSDERMAL
  Filled 2020-10-23 (×3): qty 1

## 2020-10-23 NOTE — Plan of Care (Signed)
  Problem: Health Behavior/Discharge Planning: Goal: Ability to manage health-related needs will improve Outcome: Progressing   Problem: Clinical Measurements: Goal: Respiratory complications will improve Outcome: Progressing   Problem: Coping: Goal: Level of anxiety will decrease Outcome: Not Progressing   Problem: Pain Managment: Goal: General experience of comfort will improve Outcome: Not Progressing

## 2020-10-23 NOTE — Progress Notes (Signed)
Patient became very anxious (patient stated " I am having a full blown panic attack") came to the nursing station wanting his IV removed. Upon returning to room he slipped on his paper pajama pants and attempted to get up and slipped again. Patient did not hit head. No skin issues per assigned nurse assessment. On call MD made aware of fall. No orders given. Patient is now a high fall risk. Will cont to monitor.

## 2020-10-23 NOTE — Progress Notes (Signed)
Progress Note    Samuel Ewing   ZOX:[id number]  DOB: December 08, 1970  DOA: 10/18/2020     4  PCP: Samuel Matar, MD  CC: cough, SOB, fever  Hospital Course: Samuel Ewing is a 50 year old male with PMH hypertension, hyperlipidemia, anxiety/depression who presented to the hospital on 10/19/2020 with shortness of breath, fever, cough.  There were no prior reported sick contacts. He underwent work-up on admission with CXR and CTA chest.  This was negative for PE but showed bilateral multifocal pneumonia with groundglass opacities.  COVID-19 and flu swabs were negative on admission.  RVP was obtained and was positive for parainfluenza virus.  He is not on oxygen at home and had increasing oxygen requirements during hospitalization.  He was started on IV steroids due to poor improvement on 10/21/2020.   Interval History:  No events overnight.  Ambulating well yesterday and again this morning.  His oxygen has been weaned down significantly since yesterday.  This morning he was on 3.5 L.  He was wishing to take a shower if possible as well today.  ROS: Constitutional: negative for chills and fevers, Respiratory: positive for cough and dyspnea on exertion, Cardiovascular: negative for chest pain and Gastrointestinal: negative for abdominal pain  Assessment & Plan: Sepsis with acute hypoxic respiratory failure (HCC) - see PNA - weaning O2 as able; not on home O2 at baseline   Pneumonia of both lungs due to infectious organism - Bilateral groundglass opacities seen on CTA chest on admission - COVID and flu swabs negative - RVP positive for parainfluenza virus -Patient has had slow improvement during hospitalization.  - Solu-Medrol started on 10/21/2020 due to escalating O2 requirements -O2 requirements have been dramatically improving since yesterday.  Has been weaned down to 3.5 L nasal cannula this morning and ambulating fairly well - Continue weaning if able; home O2 being arranged in  case  Physical deconditioning - PT eval  Prediabetes - A1c 6.4% on 10/19/2020 - Continue SSI and CBG monitoring -Elevated glucose expected in setting of steroid use as well  Hypothyroidism - Continue Synthroid -TSH 0.431 on 10/20/2020  Generalized anxiety disorder - Patient rather anxious at baseline.  Takes BuSpar at home but has worsened during hospitalization likely in setting of underlying acute illness as well as initiation of steroids - Increase Ativan further while hospitalized - Continue BuSpar and Cymbalta  Hyperlipidemia - Continue Lipitor  Essential hypertension - Currently controlled without medicine   Old records reviewed in assessment of this patient  Antimicrobials: Azithro 4/24 >>4/27 Rocephin 4/24 >> current   DVT prophylaxis: enoxaparin (LOVENOX) injection 40 mg Start: 10/19/20 1000   Code Status:   Code Status: Full Code Family Communication:   Disposition Plan: Status is: Inpatient  Remains inpatient appropriate because:Inpatient level of care appropriate due to severity of illness   Dispo:  Patient From: Home  Planned Disposition: Home with Health Care Svc  Medically stable for discharge: No    Risk of unplanned readmission score: Unplanned Admission- Pilot do not use: 32.15   Objective: Blood pressure (!) 142/81, pulse 68, temperature 99.1 F (37.3 C), resp. rate 20, height 5\' 10"  (1.778 m), weight 88.6 kg, SpO2 96 %.  Examination: General appearance: alert, cooperative, no distress and less anxious appearing Head: Normocephalic, without obvious abnormality, atraumatic Eyes: EOMI Lungs: Coarse breath sounds bilaterally, worse on right.  No wheezing Heart: regular rate and rhythm and S1, S2 normal Abdomen: normal findings: bowel sounds normal and soft, non-tender Extremities: No edema Skin:  mobility and turgor normal Neurologic: Grossly normal  Consultants:     Procedures:     Data Reviewed: I have personally reviewed  following labs and imaging studies Results for orders placed or performed during the hospital encounter of 10/18/20 (from the past 24 hour(s))  Glucose, capillary     Status: Abnormal   Collection Time: 10/22/20  6:11 PM  Result Value Ref Range   Glucose-Capillary 272 (H) 70 - 99 mg/dL  Glucose, capillary     Status: Abnormal   Collection Time: 10/22/20  9:20 PM  Result Value Ref Range   Glucose-Capillary 253 (H) 70 - 99 mg/dL  Glucose, capillary     Status: Abnormal   Collection Time: 10/23/20  7:51 AM  Result Value Ref Range   Glucose-Capillary 166 (H) 70 - 99 mg/dL  Glucose, capillary     Status: Abnormal   Collection Time: 10/23/20 12:40 PM  Result Value Ref Range   Glucose-Capillary 214 (H) 70 - 99 mg/dL    Recent Results (from the past 240 hour(s))  SARS CORONAVIRUS 2 (TAT 6-24 HRS) Nasopharyngeal Nasopharyngeal Swab     Status: None   Collection Time: 10/17/20  3:53 AM   Specimen: Nasopharyngeal Swab  Result Value Ref Range Status   SARS Coronavirus 2 NEGATIVE NEGATIVE Final    Comment: (NOTE) SARS-CoV-2 target nucleic acids are NOT DETECTED.  The SARS-CoV-2 RNA is generally detectable in upper and lower respiratory specimens during the acute phase of infection. Negative results do not preclude SARS-CoV-2 infection, do not rule out co-infections with other pathogens, and should not be used as the sole basis for treatment or other patient management decisions. Negative results must be combined with clinical observations, patient history, and epidemiological information. The expected result is Negative.  Fact Sheet for Patients: HairSlick.no  Fact Sheet for Healthcare Providers: quierodirigir.com  This test is not yet approved or cleared by the [place] FDA and  has been authorized for detection and/or diagnosis of SARS-CoV-2 by FDA under an Emergency Use Authorization (EUA). This EUA will remain  in effect  (meaning this test can be used) for the duration of the COVID-19 declaration under Se ction 564(b)(1) of the Act, 21 U.S.C. section 360bbb-3(b)(1), unless the authorization is terminated or revoked sooner.  Performed at [facility], [address], [place], [place] [address]   Urine Culture     Status: Abnormal   Collection Time: 10/19/20 12:14 AM   Specimen: Urine, Clean Catch  Result Value Ref Range Status   Specimen Description   Final    URINE, CLEAN CATCH Performed at [facility], [address] [place], [place] [address]    Special Requests   Final    NONE Performed at [facility], [address] [place], [place] [address]    Culture (A)  Final    <10,000 COLONIES/mL INSIGNIFICANT GROWTH Performed at [facility], [address] [place], [place] [address]    Report Status 10/20/2020 FINAL  Final  Resp Panel by RT-PCR (Flu A&B, Covid) Urine, Clean Catch     Status: None   Collection Time: 10/19/20 12:14 AM   Specimen: Urine, Clean Catch; Nasopharyngeal(NP) swabs in vial transport medium  Result Value Ref Range Status   SARS Coronavirus 2 by RT PCR NEGATIVE NEGATIVE Final    Comment: (NOTE) SARS-CoV-2 target nucleic acids are NOT DETECTED.  The SARS-CoV-2 RNA is generally detectable in upper respiratory specimens during the acute  phase of infection. The lowest concentration of SARS-CoV-2 viral copies this assay can detect is 138 copies/mL. A negative result does not preclude SARS-Cov-2 infection and should not be used as the sole basis for treatment or other patient management decisions. A negative result may occur with  improper specimen collection/handling, submission of specimen other than nasopharyngeal swab, presence of viral mutation(s) within the areas targeted by this assay, and inadequate number of viral copies(<138 copies/mL). A negative result must be combined with clinical observations, patient history, and  epidemiological information. The expected result is Negative.  Fact Sheet for Patients:  BloggerCourse.comhttps://www.fda.gov/media/152166/download  Fact Sheet for Healthcare Providers:  SeriousBroker.ithttps://www.fda.gov/media/152162/download  This test is no t yet approved or cleared by the [place] FDA and  has been authorized for detection and/or diagnosis of SARS-CoV-2 by FDA under an Emergency Use Authorization (EUA). This EUA will remain  in effect (meaning this test can be used) for the duration of the COVID-19 declaration under Section 564(b)(1) of the Act, 21 U.S.C.section 360bbb-3(b)(1), unless the authorization is terminated  or revoked sooner.       Influenza A by PCR NEGATIVE NEGATIVE Final   Influenza B by PCR NEGATIVE NEGATIVE Final    Comment: (NOTE) The Xpert Xpress SARS-CoV-2/FLU/RSV plus assay is intended as an aid in the diagnosis of influenza from Nasopharyngeal swab specimens and should not be used as a sole basis for treatment. Nasal washings and aspirates are unacceptable for Xpert Xpress SARS-CoV-2/FLU/RSV testing.  Fact Sheet for Patients: BloggerCourse.comhttps://www.fda.gov/media/152166/download  Fact Sheet for Healthcare Providers: SeriousBroker.ithttps://www.fda.gov/media/152162/download  This test is not yet approved or cleared by the [place] FDA and has been authorized for detection and/or diagnosis of SARS-CoV-2 by FDA under an Emergency Use Authorization (EUA). This EUA will remain in effect (meaning this test can be used) for the duration of the COVID-19 declaration under Section 564(b)(1) of the Act, 21 U.S.C. section 360bbb-3(b)(1), unless the authorization is terminated or revoked.  Performed at [facility] [place], [address] [place], [place] [address]   Respiratory (~20 pathogens) panel by PCR     Status: Abnormal   Collection Time: 10/20/20  6:08 PM   Specimen: Nasopharyngeal Swab; Respiratory  Result Value Ref Range Status   Adenovirus NOT DETECTED NOT DETECTED Final    Coronavirus 229E NOT DETECTED NOT DETECTED Final    Comment: (NOTE) The Coronavirus on the Respiratory Panel, DOES NOT test for the novel  Coronavirus (2019 nCoV)    Coronavirus HKU1 NOT DETECTED NOT DETECTED Final   Coronavirus NL63 NOT DETECTED NOT DETECTED Final   Coronavirus OC43 NOT DETECTED NOT DETECTED Final   Metapneumovirus NOT DETECTED NOT DETECTED Final   Rhinovirus / Enterovirus NOT DETECTED NOT DETECTED Final   Influenza A NOT DETECTED NOT DETECTED Final   Influenza B NOT DETECTED NOT DETECTED Final   Parainfluenza Virus 1 NOT DETECTED NOT DETECTED Final   Parainfluenza Virus 2 NOT DETECTED NOT DETECTED Final   Parainfluenza Virus 3 NOT DETECTED NOT DETECTED Final   Parainfluenza Virus 4 DETECTED (A) NOT DETECTED Final   Respiratory Syncytial Virus NOT DETECTED NOT DETECTED Final   Bordetella pertussis NOT DETECTED NOT DETECTED Final   Bordetella Parapertussis NOT DETECTED NOT DETECTED Final   Chlamydophila pneumoniae NOT DETECTED NOT DETECTED Final   Mycoplasma pneumoniae NOT DETECTED NOT DETECTED Final    Comment: Performed at [facility], [address] [place], [place] [address]     Radiology Studies: No results found. [facility]  [facility]  Final Result    CT Angio Chest PE W and/or Wo Contrast  Final Result      Scheduled Meds: . (feeding supplement) PROSource Plus  30 mL Oral BID BM  . atorvastatin  20 mg Oral Daily  . busPIRone  30 mg Oral BID  . dextromethorphan-guaiFENesin  1 tablet Oral BID  . DULoxetine  60 mg Oral BID  . enoxaparin (LOVENOX) injection  40 mg Subcutaneous Q24H  . feeding supplement  237 mL Oral BID BM  . gabapentin  600 mg Oral BID  . insulin aspart  0-5 Units Subcutaneous QHS  . insulin aspart  0-9 Units Subcutaneous TID WC  . levothyroxine  50 mcg Oral Q0600  . meloxicam  15 mg Oral Daily  . [START ON 10/24/2020] methylPREDNISolone (SOLU-MEDROL) injection  60 mg Intravenous Daily  . multivitamin with minerals  1 tablet  Oral Daily  . nicotine  21 mg Transdermal Daily  . ziprasidone  40 mg Oral BID WC   PRN Meds: acetaminophen **OR** [DISCONTINUED] acetaminophen, albuterol, HYDROmorphone, hydrOXYzine, LORazepam, ondansetron **OR** ondansetron (ZOFRAN) IV Continuous Infusions: . cefTRIAXone (ROCEPHIN)  IV 2 g (10/22/20 1826)     LOS: 4 days  Time spent: Greater than 50% of the 35 minute visit was spent in counseling/coordination of care for the patient as laid out in the A&P.   Lewie Chamber, MD [facility] 10/23/2020, 3:08 PM

## 2020-10-23 NOTE — TOC Progression Note (Signed)
Transition of Care [facility]) - Progression Note    Patient Details  Name: Samuel Ewing MRN: [id number] Date of Birth: 1971-03-01  [facility] CM/SW Contact  Alania Overholt, Olegario Messier, RN Phone Number: 10/23/2020, 6:26 PM  Clinical Narrative:  Kathryne Eriksson w/dtr Clydie Braun to inform of PT recc HHPT, home 02 ordered, & dme ordered-await call back.          Expected Discharge Plan and Services                                                 Social Determinants of Health (SDOH) Interventions    Readmission Risk Interventions Readmission Risk Prevention Plan 10/21/2020  Transportation Screening Complete  Medication Review (RN Care Manager) Complete  PCP or Specialist appointment within 3-5 days of discharge Complete  HRI or Home Care Consult Complete  SW Recovery Care/Counseling Consult Complete  Palliative Care Screening Not Applicable  Skilled Nursing Facility Not Applicable

## 2020-10-24 LAB — GLUCOSE, CAPILLARY
Glucose-Capillary: 128 mg/dL — ABNORMAL HIGH (ref 70–99)
Glucose-Capillary: 123 mg/dL — ABNORMAL HIGH (ref 70–99)

## 2020-10-24 MED ORDER — LORAZEPAM 0.5 MG PO TABS
0.5000 mg | ORAL_TABLET | Freq: Once | ORAL | Status: AC
  Administered 2020-10-24: 0.5 mg via ORAL
  Filled 2020-10-24: qty 1

## 2020-10-24 MED ORDER — PREDNISONE 20 MG PO TABS: 40.0000 mg | ORAL_TABLET | Freq: Every day | ORAL | 0 refills | Status: AC

## 2020-10-24 MED ORDER — ALBUTEROL SULFATE HFA 108 (90 BASE) MCG/ACT IN AERS: 2.0000 | INHALATION_SPRAY | RESPIRATORY_TRACT | 2 refills | Status: DC | PRN

## 2020-10-24 NOTE — Discharge Summary (Signed)
Physician Discharge Summary   Samuel Ewing JQB:[id number] DOB: [birth year] DOA: 10/18/2020  PCP: Marcine Matar, MD  Admit date: 10/18/2020 Discharge date:  10/24/2020  Admitted From: Home Disposition: Home Discharging physician: Lewie Chamber, MD  Recommendations for Outpatient Follow-up:  1. Repeat CXR in approximately 4 to 6 weeks for resolution   Patient discharged to home in Discharge Condition: stable Risk of unplanned readmission score: Unplanned Admission- Pilot do not use: 32.09  CODE STATUS: Full Diet recommendation:  Diet Orders (From admission, onward)    Start     Ordered   10/24/20 0000  Diet general        10/24/20 1108   10/19/20 0311  Diet regular Room service appropriate? Yes; Fluid consistency: Thin  Diet effective now       Question Answer Comment  Room service appropriate? Yes   Fluid consistency: Thin      10/19/20 4097          Hospital Course: Mr. Retter is a 50 year old male with PMH hypertension, hyperlipidemia, anxiety/depression who presented to the hospital on 10/19/2020 with shortness of breath, fever, cough.  There were no prior reported sick contacts. He underwent work-up on admission with CXR and CTA chest.  This was negative for PE but showed bilateral multifocal pneumonia with groundglass opacities.  COVID-19 and flu swabs were negative on admission.  RVP was obtained and was positive for parainfluenza virus.  He is not on oxygen at home and had increasing oxygen requirements during hospitalization.  He was started on IV steroids due to poor improvement on 10/21/2020. He had good clinical improvement after starting steroids and oxygen was able to be weaned further.  He underwent walk testing prior to discharge and was deemed to require 2 L nasal cannula oxygen mostly with exertion. He completed antibiotic courses during hospitalization and was discharged with prednisone to complete his course and given an albuterol inhaler for any  shortness of breath. He will need a repeat CXR in 4 to 6 weeks for resolution.   * Pneumonia of both lungs due to infectious organism - Bilateral groundglass opacities seen on CTA chest on admission - COVID and flu swabs negative - RVP positive for parainfluenza virus -Patient has had slow improvement during hospitalization. - Solu-Medrol started on 10/21/2020 due to escalating O2 requirements -O2 requirements have been dramatically improving since starting steroids - Weaned down to 2 L oxygen which he mostly needs with exertion; home O2 arranged at discharge -Antibiotic course considered completed at discharge - Albuterol inhaler and prednisone also prescribed at discharge  Sepsis with acute hypoxic respiratory failure (HCC) - see PNA - weaning O2 as able; not on home O2 at baseline   Physical deconditioning - PT eval; home health PT ordered  Prediabetes - A1c 6.4% on 10/19/2020 - Continue SSI and CBG monitoring -Elevated glucose expected in setting of steroid use as well  Hypothyroidism - Continue Synthroid -TSH 0.431 on 10/20/2020  Generalized anxiety disorder - Patient rather anxious at baseline.  Takes BuSpar at home but has worsened during hospitalization likely in setting of underlying acute illness as well as initiation of steroids - Continue BuSpar and Cymbalta -Required increased doses of Ativan during hospitalization but discharged back on home dose  Hyperlipidemia - Continue Lipitor  Essential hypertension - Currently controlled without medicine  Azithro 4/24 >>4/27 Rocephin 4/24 >> 4/28  The patient's chronic medical conditions were treated accordingly per the patient's home medication regimen except as noted.  On day of discharge,  patient was felt deemed stable for discharge. Patient/family member advised to call PCP or come back to ER if needed.  Principal Diagnosis: Pneumonia of both lungs due to infectious organism  Discharge Diagnoses: Active Hospital  Problems   Diagnosis Date Noted  . Pneumonia of both lungs due to infectious organism 10/19/2020    Priority: High  . Sepsis with acute hypoxic respiratory failure (HCC) 10/19/2020    Priority: High  . Physical deconditioning 10/22/2020  . Prediabetes 10/19/2020  . Hypothyroidism 06/06/2020  . Generalized anxiety disorder 06/06/2020  . Essential hypertension 06/06/2020  . Hyperlipidemia 06/06/2020    Resolved Hospital Problems  No resolved problems to display.    Discharge Instructions    Diet general   Complete by: As directed    Increase activity slowly   Complete by: As directed      Allergies as of 10/24/2020      Reactions   Morphine And Related       Medication List    TAKE these medications   albuterol 108 (90 Base) MCG/ACT inhaler Commonly known as: VENTOLIN HFA Inhale 2 puffs into the lungs every 4 (four) hours as needed for wheezing or shortness of breath.   amLODipine 10 MG tablet Commonly known as: NORVASC Take 1 tablet (10 mg total) by mouth daily.   atorvastatin 20 MG tablet Commonly known as: LIPITOR Take 1 tablet (20 mg total) by mouth daily.   busPIRone 30 MG tablet Commonly known as: BUSPAR Take 1 tablet (30 mg total) by mouth 2 (two) times daily.   DULoxetine 60 MG capsule Commonly known as: CYMBALTA Take 2 capsules (120 mg total) by mouth daily. What changed:   how much to take  when to take this   gabapentin 300 MG capsule Commonly known as: NEURONTIN Take 600 mg by mouth 2 (two) times daily. 2 capsules in the morning 2 capsules in the evening   HYDROmorphone 4 MG tablet Commonly known as: DILAUDID Take 8 mg by mouth every 6 (six) hours as needed for severe pain.   hydrOXYzine 50 MG tablet Commonly known as: ATARAX/VISTARIL Take 1 tablet (50 mg total) by mouth 3 (three) times daily as needed for anxiety.   levothyroxine 50 MCG tablet Commonly known as: SYNTHROID Take 1 tablet (50 mcg total) by mouth daily.   lidocaine 5  % Commonly known as: Lidoderm Place 1 patch onto the skin daily. Remove & Discard patch within 12 hours or as directed by MD   lisinopril 20 MG tablet Commonly known as: ZESTRIL Take 1 tablet (20 mg total) by mouth daily.   LORazepam 0.5 MG tablet Commonly known as: ATIVAN Take 1 tablet (0.5 mg total) by mouth daily as needed for anxiety.   meloxicam 15 MG tablet Commonly known as: Mobic Take 1 tablet (15 mg total) by mouth daily.   methocarbamol 500 MG tablet Commonly known as: ROBAXIN Take 1 tablet (500 mg total) by mouth every 6 (six) hours as needed for muscle spasms.   predniSONE 20 MG tablet Commonly known as: DELTASONE Take 2 tablets (40 mg total) by mouth daily with breakfast for 3 days.   promethazine 12.5 MG tablet Commonly known as: PHENERGAN Take 12.5 mg by mouth every 6 (six) hours as needed for nausea or vomiting.   SUMAtriptan 100 MG tablet Commonly known as: IMITREX Take 100 mg by mouth daily as needed for migraine. May repeat in 2 hours if headache persists or recurs.   ziprasidone 40 MG capsule Commonly  known as: GEODON Take 1 capsule (40 mg total) by mouth 2 (two) times daily with a meal.            Durable Medical Equipment  (From admission, onward)         Start     Ordered   10/23/20 0800  For home use only DME oxygen  Once       Question Answer Comment  Length of Need 6 Months   Mode or (Route) Nasal cannula   Liters per Minute 2   Frequency Continuous (stationary and portable oxygen unit needed)   Oxygen delivery system Gas      10/23/20 0759   10/23/20 0759  For home use only DME Tub bench  Once        10/23/20 0758   10/23/20 0759  For home use only DME Shower stool  Once        10/23/20 1610          Follow-up Information    Marcine Matar, MD. Schedule an appointment as soon as possible for a visit in 2 week(s).   Specialty: Internal Medicine Contact information: [address] [place]  [address] [phone]              Allergies  Allergen Reactions  . Morphine And Related     Consultations: n/a  Discharge Exam: BP (!) 164/94 (BP Location: Right Arm)   Pulse 72   Temp 99 F (37.2 C)   Resp 20   Ht  (1.778 m)   Wt 88.6 kg   SpO2 95%   BMI 28.03 kg/m  General appearance: alert, cooperative, no distress and less anxious appearing Head: Normocephalic, without obvious abnormality, atraumatic Eyes: EOMI Lungs: Coarse breath sounds bilaterally, worse on right.  No wheezing Heart: regular rate and rhythm and S1, S2 normal Abdomen: normal findings: bowel sounds normal and soft, non-tender Extremities: No edema Skin: mobility and turgor normal Neurologic: Grossly normal  The results of significant diagnostics from this hospitalization (including imaging, microbiology, ancillary and laboratory) are listed below for reference.   Microbiology: Recent Results (from the past 240 hour(s))  SARS CORONAVIRUS 2 (TAT 6-24 HRS) Nasopharyngeal Nasopharyngeal Swab     Status: None   Collection Time: 10/17/20  3:53 AM   Specimen: Nasopharyngeal Swab  Result Value Ref Range Status   SARS Coronavirus 2 NEGATIVE NEGATIVE Final    Comment: (NOTE) SARS-CoV-2 target nucleic acids are NOT DETECTED.  The SARS-CoV-2 RNA is generally detectable in upper and lower respiratory specimens during the acute phase of infection. Negative results do not preclude SARS-CoV-2 infection, do not rule out co-infections with other pathogens, and should not be used as the sole basis for treatment or other patient management decisions. Negative results must be combined with clinical observations, patient history, and epidemiological information. The expected result is Negative.  Fact Sheet for Patients: HairSlick.no  Fact Sheet for Healthcare Providers: quierodirigir.com  This test is not yet approved or cleared by the [place] FDA and  has been authorized for detection and/or diagnosis of SARS-CoV-2 by FDA under an Emergency Use Authorization (EUA). This EUA will remain  in effect (meaning this test can be used) for the duration of the COVID-19 declaration under Se ction 564(b)(1) of the Act, 21 U.S.C. section 360bbb-3(b)(1), unless the authorization is terminated or revoked sooner.  Performed at [facility], [address] [place], [place] [address]   Urine Culture  Status: Abnormal   Collection Time: 10/19/20 12:14 AM   Specimen: Urine, Clean Catch  Result Value Ref Range Status   Specimen Description   Final    URINE, CLEAN CATCH Performed at [facility], [address] [place], [place] [address]    Special Requests   Final    NONE Performed at [facility], [address] [place], [place] [address]    Culture (A)  Final    <10,000 COLONIES/mL INSIGNIFICANT GROWTH Performed at [facility], [address], [place], [place] [address]    Report Status 10/20/2020 FINAL  Final  Resp Panel by RT-PCR (Flu A&B, Covid) Urine, Clean Catch     Status: None   Collection Time: 10/19/20 12:14 AM   Specimen: Urine, Clean Catch; Nasopharyngeal(NP) swabs in vial transport medium  Result Value Ref Range Status   SARS Coronavirus 2 by RT PCR NEGATIVE NEGATIVE Final    Comment: (NOTE) SARS-CoV-2 target nucleic acids are NOT DETECTED.  The SARS-CoV-2 RNA is generally detectable in upper respiratory specimens during the acute phase of infection. The lowest concentration of SARS-CoV-2 viral copies this assay can detect is 138 copies/mL. A negative result does not preclude SARS-Cov-2 infection and should not be used as the sole basis for treatment or other patient management decisions. A negative result may occur with  improper specimen collection/handling, submission of specimen other than nasopharyngeal swab, presence of viral mutation(s) within the areas targeted  by this assay, and inadequate number of viral copies(<138 copies/mL). A negative result must be combined with clinical observations, patient history, and epidemiological information. The expected result is Negative.  Fact Sheet for Patients:  BloggerCourse.com  Fact Sheet for Healthcare Providers:  SeriousBroker.it  This test is no t yet approved or cleared by the [place] FDA and  has been authorized for detection and/or diagnosis of SARS-CoV-2 by FDA under an Emergency Use Authorization (EUA). This EUA will remain  in effect (meaning this test can be used) for the duration of the COVID-19 declaration under Section 564(b)(1) of the Act, 21 U.S.C.section 360bbb-3(b)(1), unless the authorization is terminated  or revoked sooner.       Influenza A by PCR NEGATIVE NEGATIVE Final   Influenza B by PCR NEGATIVE NEGATIVE Final    Comment: (NOTE) The Xpert Xpress SARS-CoV-2/FLU/RSV plus assay is intended as an aid in the diagnosis of influenza from Nasopharyngeal swab specimens and should not be used as a sole basis for treatment. Nasal washings and aspirates are unacceptable for Xpert Xpress SARS-CoV-2/FLU/RSV testing.  Fact Sheet for Patients: BloggerCourse.com  Fact Sheet for Healthcare Providers: SeriousBroker.it  This test is not yet approved or cleared by the [place] FDA and has been authorized for detection and/or diagnosis of SARS-CoV-2 by FDA under an Emergency Use Authorization (EUA). This EUA will remain in effect (meaning this test can be used) for the duration of the COVID-19 declaration under Section 564(b)(1) of the Act, 21 U.S.C. section 360bbb-3(b)(1), unless the authorization is terminated or revoked.  Performed at [facility], [address] [place], [place] [address]   Respiratory (~20 pathogens) panel by PCR     Status: Abnormal    Collection Time: 10/20/20  6:08 PM   Specimen: Nasopharyngeal Swab; Respiratory  Result Value Ref Range Status   Adenovirus NOT DETECTED NOT DETECTED Final   Coronavirus 229E NOT DETECTED NOT DETECTED Final    Comment: (NOTE) The Coronavirus on the Respiratory Panel,  DOES NOT test for the novel  Coronavirus (2019 nCoV)    Coronavirus HKU1 NOT DETECTED NOT DETECTED Final   Coronavirus NL63 NOT DETECTED NOT DETECTED Final   Coronavirus OC43 NOT DETECTED NOT DETECTED Final   Metapneumovirus NOT DETECTED NOT DETECTED Final   Rhinovirus / Enterovirus NOT DETECTED NOT DETECTED Final   Influenza A NOT DETECTED NOT DETECTED Final   Influenza B NOT DETECTED NOT DETECTED Final   Parainfluenza Virus 1 NOT DETECTED NOT DETECTED Final   Parainfluenza Virus 2 NOT DETECTED NOT DETECTED Final   Parainfluenza Virus 3 NOT DETECTED NOT DETECTED Final   Parainfluenza Virus 4 DETECTED (A) NOT DETECTED Final   Respiratory Syncytial Virus NOT DETECTED NOT DETECTED Final   Bordetella pertussis NOT DETECTED NOT DETECTED Final   Bordetella Parapertussis NOT DETECTED NOT DETECTED Final   Chlamydophila pneumoniae NOT DETECTED NOT DETECTED Final   Mycoplasma pneumoniae NOT DETECTED NOT DETECTED Final    Comment: Performed at [facility], [address] [place] [address], [place], [place] [address]     Labs: BNP (last 3 results) No results for input(s): BNP in the last 8760 hours. Basic Metabolic Panel: Recent Labs  Lab 10/18/20 2149 10/18/20 2208 10/19/20 0604 10/20/20 0529 10/21/20 0503 10/22/20 0429  NA 129* 133* 135 140 133* 140  K 3.5 3.6 3.4* 3.6 3.8 4.0  CL 93*  --  102 103 98 104  CO2 24  --  25 27 26 28   GLUCOSE 237*  --  155* 114* 120* 175*  BUN 18  --  13 15 16 20   CREATININE 0.93  --  0.78 0.95 0.92 0.73  CALCIUM 8.6*  --  8.0* 8.3* 8.1* 8.8*  MG  --   --  2.1 2.2  --   --   PHOS  --   --  2.0*  --   --   --    Liver Function Tests: Recent Labs  Lab 10/18/20 2149 10/19/20 0604  10/20/20 0529  AST 33 26 30  ALT 20 15 18   ALKPHOS 64 56 48  BILITOT 0.8 0.8 0.2*  PROT 7.7 6.6 6.4*  ALBUMIN 3.8 3.3* 2.9*   No results for input(s): LIPASE, AMYLASE in the last 168 hours. No results for input(s): AMMONIA in the last 168 hours. CBC: Recent Labs  Lab 10/18/20 2149 10/18/20 2208 10/19/20 0604 10/20/20 0529 10/21/20 0503  WBC 11.6*  --  8.3 5.5 7.7  NEUTROABS 10.2*  --   --   --   --   HGB 10.9* 11.2* 9.4* 8.4* 9.3*  HCT 31.1* 33.0* 28.0* 25.7* 27.8*  MCV 92.6  --  95.9 96.3 94.6  PLT 216  --  158 176 226   Cardiac Enzymes: Recent Labs  Lab 10/18/20 2149  CKTOTAL 189   BNP: Invalid input(s): POCBNP CBG: Recent Labs  Lab 10/23/20 1240 10/23/20 1703 10/23/20 2216 10/24/20 0816 10/24/20 1116  GLUCAP 214* 313* 177* 128* 123*   D-Dimer No results for input(s): DDIMER in the last 72 hours. Hgb A1c No results for input(s): HGBA1C in the last 72 hours. Lipid Profile No results for input(s): CHOL, HDL, LDLCALC, TRIG, CHOLHDL, LDLDIRECT in the last 72 hours. Thyroid function studies No results for input(s): TSH, T4TOTAL, T3FREE, THYROIDAB in the last 72 hours.  Invalid input(s): FREET3 Anemia work up No results for input(s): VITAMINB12, FOLATE, FERRITIN, TIBC, IRON, RETICCTPCT in the last 72 hours. Urinalysis    Component Value Date/Time   COLORURINE YELLOW 10/19/2020 0014  APPEARANCEUR CLEAR 10/19/2020 0014   LABSPEC <1.005 (L) 10/19/2020 0014   PHURINE 6.0 10/19/2020 0014   GLUCOSEU NEGATIVE 10/19/2020 0014   HGBUR TRACE (A) 10/19/2020 0014   BILIRUBINUR NEGATIVE 10/19/2020 0014   KETONESUR NEGATIVE 10/19/2020 0014   PROTEINUR NEGATIVE 10/19/2020 0014   NITRITE NEGATIVE 10/19/2020 0014   LEUKOCYTESUR NEGATIVE 10/19/2020 0014   Sepsis Labs Invalid input(s): PROCALCITONIN,  WBC,  LACTICIDVEN Microbiology Recent Results (from the past 240 hour(s))  SARS CORONAVIRUS 2 (TAT 6-24 HRS) Nasopharyngeal Nasopharyngeal Swab     Status: None    Collection Time: 10/17/20  3:53 AM   Specimen: Nasopharyngeal Swab  Result Value Ref Range Status   SARS Coronavirus 2 NEGATIVE NEGATIVE Final    Comment: (NOTE) SARS-CoV-2 target nucleic acids are NOT DETECTED.  The SARS-CoV-2 RNA is generally detectable in upper and lower respiratory specimens during the acute phase of infection. Negative results do not preclude SARS-CoV-2 infection, do not rule out co-infections with other pathogens, and should not be used as the sole basis for treatment or other patient management decisions. Negative results must be combined with clinical observations, patient history, and epidemiological information. The expected result is Negative.  Fact Sheet for Patients: HairSlick.nohttps://www.fda.gov/media/138098/download  Fact Sheet for Healthcare Providers: quierodirigir.comhttps://www.fda.gov/media/138095/download  This test is not yet approved or cleared by the [place] FDA and  has been authorized for detection and/or diagnosis of SARS-CoV-2 by FDA under an Emergency Use Authorization (EUA). This EUA will remain  in effect (meaning this test can be used) for the duration of the COVID-19 declaration under Se ction 564(b)(1) of the Act, 21 U.S.C. section 360bbb-3(b)(1), unless the authorization is terminated or revoked sooner.  Performed at [facility], [address] [place], [place] [address]   Urine Culture     Status: Abnormal   Collection Time: 10/19/20 12:14 AM   Specimen: Urine, Clean Catch  Result Value Ref Range Status   Specimen Description   Final    URINE, CLEAN CATCH Performed at [facility] [place], [address] [place], [place] [address]    Special Requests   Final    NONE Performed at [facility] [place], [address] [place], [place] [address]    Culture (A)  Final    <10,000 COLONIES/mL INSIGNIFICANT GROWTH Performed at [facility], [address] [place], [place] [address]    Report Status 10/20/2020 FINAL  Final   Resp Panel by RT-PCR (Flu A&B, Covid) Urine, Clean Catch     Status: None   Collection Time: 10/19/20 12:14 AM   Specimen: Urine, Clean Catch; Nasopharyngeal(NP) swabs in vial transport medium  Result Value Ref Range Status   SARS Coronavirus 2 by RT PCR NEGATIVE NEGATIVE Final    Comment: (NOTE) SARS-CoV-2 target nucleic acids are NOT DETECTED.  The SARS-CoV-2 RNA is generally detectable in upper respiratory specimens during the acute phase of infection. The lowest concentration of SARS-CoV-2 viral copies this assay can detect is 138 copies/mL. A negative result does not preclude SARS-Cov-2 infection and should not be used as the sole basis for treatment or other patient management decisions. A negative result may occur with  improper specimen collection/handling, submission of specimen other than nasopharyngeal swab, presence of viral mutation(s) within the areas targeted by this assay, and inadequate number of viral copies(<138 copies/mL). A negative result must be combined with clinical observations, patient history, and epidemiological information. The expected result is Negative.  Fact Sheet for Patients:  BloggerCourse.com  Fact Sheet for Healthcare Providers:  SeriousBroker.it  This test is no t yet approved or cleared by the [place] FDA and  has been authorized for detection and/or diagnosis of SARS-CoV-2 by FDA under an Emergency Use Authorization (EUA). This EUA will remain  in effect (meaning this test can be used) for the duration of the COVID-19 declaration under Section 564(b)(1) of the Act, 21 U.S.C.section 360bbb-3(b)(1), unless the authorization is terminated  or revoked sooner.       Influenza A by PCR NEGATIVE NEGATIVE Final   Influenza B by PCR NEGATIVE NEGATIVE Final    Comment: (NOTE) The Xpert Xpress SARS-CoV-2/FLU/RSV plus assay is intended as an aid in the diagnosis of influenza from  Nasopharyngeal swab specimens and should not be used as a sole basis for treatment. Nasal washings and aspirates are unacceptable for Xpert Xpress SARS-CoV-2/FLU/RSV testing.  Fact Sheet for Patients: BloggerCourse.com  Fact Sheet for Healthcare Providers: SeriousBroker.it  This test is not yet approved or cleared by the [place] FDA and has been authorized for detection and/or diagnosis of SARS-CoV-2 by FDA under an Emergency Use Authorization (EUA). This EUA will remain in effect (meaning this test can be used) for the duration of the COVID-19 declaration under Section 564(b)(1) of the Act, 21 U.S.C. section 360bbb-3(b)(1), unless the authorization is terminated or revoked.  Performed at [facility], [address] [place], [place] [address]   Respiratory (~20 pathogens) panel by PCR     Status: Abnormal   Collection Time: 10/20/20  6:08 PM   Specimen: Nasopharyngeal Swab; Respiratory  Result Value Ref Range Status   Adenovirus NOT DETECTED NOT DETECTED Final   Coronavirus 229E NOT DETECTED NOT DETECTED Final    Comment: (NOTE) The Coronavirus on the Respiratory Panel, DOES NOT test for the novel  Coronavirus (2019 nCoV)    Coronavirus HKU1 NOT DETECTED NOT DETECTED Final   Coronavirus NL63 NOT DETECTED NOT DETECTED Final   Coronavirus OC43 NOT DETECTED NOT DETECTED Final   Metapneumovirus NOT DETECTED NOT DETECTED Final   Rhinovirus / Enterovirus NOT DETECTED NOT DETECTED Final   Influenza A NOT DETECTED NOT DETECTED Final   Influenza B NOT DETECTED NOT DETECTED Final   Parainfluenza Virus 1 NOT DETECTED NOT DETECTED Final   Parainfluenza Virus 2 NOT DETECTED NOT DETECTED Final   Parainfluenza Virus 3 NOT DETECTED NOT DETECTED Final   Parainfluenza Virus 4 DETECTED (A) NOT DETECTED Final   Respiratory Syncytial Virus NOT DETECTED NOT DETECTED Final   Bordetella pertussis NOT DETECTED NOT DETECTED Final    Bordetella Parapertussis NOT DETECTED NOT DETECTED Final   Chlamydophila pneumoniae NOT DETECTED NOT DETECTED Final   Mycoplasma pneumoniae NOT DETECTED NOT DETECTED Final    Comment: Performed at [facility], [address], [place], [place] [address]    Procedures/Studies: CT Angio Chest PE W and/or Wo Contrast  Result Date: 10/19/2020 CLINICAL DATA:  Fever EXAM: CT ANGIOGRAPHY CHEST WITH CONTRAST TECHNIQUE: Multidetector CT imaging of the chest was performed using the standard protocol during bolus administration of intravenous contrast. Multiplanar CT image reconstructions and MIPs were obtained to evaluate the vascular anatomy. CONTRAST:  OMNIPAQUE IOHEXOL 350 MG/ML SOLN COMPARISON:  None. FINDINGS: Cardiovascular: Contrast injection is sufficient to demonstrate satisfactory opacification of the pulmonary arteries to the segmental level. There is no pulmonary embolus or evidence of right heart strain. The size of the main pulmonary artery is normal. Heart size is normal, with no  pericardial effusion. The course and caliber of the aorta are normal. There is no atherosclerotic calcification. Opacification decreased due to pulmonary arterial phase contrast bolus timing. Mediastinum/Nodes: No mediastinal, hilar or axillary lymphadenopathy. Normal visualized thyroid. Thoracic esophageal course is normal. Lungs/Pleura: Multifocal, peripheral predominant ground glass opacities. No pleural effusion. Upper Abdomen: Contrast bolus timing is not optimized for evaluation of the abdominal organs. The visualized portions of the organs of the upper abdomen are normal. Musculoskeletal: No chest wall abnormality. No bony spinal canal stenosis. Review of the MIP images confirms the above findings. IMPRESSION: 1. No pulmonary embolus or acute aortic syndrome. 2. Multifocal, peripheral predominant ground glass opacities, most consistent with pneumonia, including COVID-19. Electronically Signed   By: Deatra Robinson M.D.   On: 10/19/2020 00:06   MR Cervical Spine Wo Contrast  Result Date: 09/30/2020 CLINICAL DATA:  Cervical radiculopathy. Recent cervical spine surgery approximately 4 days prior. EXAM: MRI CERVICAL SPINE WITHOUT CONTRAST TECHNIQUE: Multiplanar, multisequence MR imaging of the cervical spine was performed. No intravenous contrast was administered. COMPARISON:  None. FINDINGS: Alignment: Normal alignment.  Straightening of the cervical lordosis Vertebrae: Normal bone marrow.  Negative for fracture or mass. Cord: Normal signal and morphology. Posterior Fossa, vertebral arteries, paraspinal tissues: Prevertebral edema bilaterally throughout the cervical spine. No associated bone marrow edema. Disc levels: C2-3: Negative C3-4: Small central disc protrusion mild facet degeneration and mild foraminal stenosis bilaterally. C4-5: Small central disc protrusion.  Negative for stenosis C5-6: Interbody metal prosthesis at C5-6. This is causing local artifact. No significant stenosis C6-7: Interbody metal prosthesis at C6-7. This is causing local artifact. 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 Small central disc protrusion C4-5 Prevertebral soft tissue edema throughout the cervical spine bilaterally extending into the upper cervicothoracic spine, consistent with recent cervical spine surgery. Electronically Signed   By: Marlan Palau M.D.   On: 09/30/2020 19:22   MR THORACIC SPINE WO CONTRAST  Result Date: 09/30/2020 CLINICAL DATA:  Cervical spine surgery 4 days prior. New urinary incontinence. Rule out cord compression. EXAM: MRI THORACIC SPINE WITHOUT CONTRAST TECHNIQUE: Multiplanar, multisequence MR imaging of the thoracic spine was performed. No intravenous contrast was administered. COMPARISON:  None. FINDINGS: Alignment:  Normal Vertebrae: Normal bone marrow.  Negative for fracture, mass, or infection. Cord:  Normal signal and morphology.  No cord compression. Paraspinal and other soft tissues: Prevertebral edema in the cervical spine extending into the upper thoracic spine. No loculated fluid. This is most likely related to recent cervical spine surgery. Disc levels: T1-2: Negative T2-3: Negative T3-4: Negative T4-5: Negative T5-6: Negative T6-7: Small right paracentral disc protrusion touching the cord. No cord deformity or stenosis T7-8: Mild to moderate left paracentral disc protrusion touching the cord with cord flattening. Bilateral facet degeneration. Negative for stenosis. T8-9: Bilateral mild facet degeneration.  Negative for stenosis T9-10: Small central disc protrusion. Bilateral facet degeneration. Negative for stenosis. T10-11: Bilateral facet degeneration.  Negative for stenosis T11-12: Mild facet degeneration.  Negative for stenosis T12-L1: Negative IMPRESSION: Negative for cord compression Multilevel degenerative change in the thoracic spine Prevertebral soft tissue swelling in the cervical and upper thoracic spine consistent with recent cervical spine surgery. Electronically Signed   By: Marlan Palau M.D.   On: 09/30/2020 19:28   MR LUMBAR SPINE WO CONTRAST  Result Date: 09/30/2020 CLINICAL DATA:  Cervical spine surgery 4 days ago. Urinary incontinence. Rule out cauda equina  syndrome. EXAM: MRI LUMBAR SPINE WITHOUT CONTRAST TECHNIQUE: Multiplanar, multisequence MR imaging of the lumbar spine was performed. No intravenous contrast was administered. COMPARISON:  None. FINDINGS: Segmentation:  Normal Alignment:  Normal Vertebrae: Normal bone marrow. Negative for fracture, mass, or infection Conus medullaris and cauda equina: Conus extends to the L1-2 level. Conus and cauda equina appear normal. Paraspinal and other soft tissues: Negative for paraspinous mass, adenopathy, fluid collection Disc levels: Normal disc spaces.  No degenerative change or neural  impingement. IMPRESSION: Normal MRI lumbar spine. Electronically Signed   By: Marlan Palau M.D.   On: 09/30/2020 19:38   DG Chest Port 1 View  Result Date: 10/20/2020 CLINICAL DATA:  Follow-up pneumonia EXAM: PORTABLE CHEST 1 VIEW COMPARISON:  10/17/2020 chest radiograph. FINDINGS: Surgical hardware overlies the lower cervical spine. Stable cardiomediastinal silhouette with normal heart size. No pneumothorax. No pleural effusion. Extensive patchy opacities throughout both lungs, substantially worsened from prior chest radiograph. IMPRESSION: Extensive patchy opacities throughout both lungs, substantially worsened from prior chest radiograph, compatible with multilobar pneumonia. Electronically Signed   By: Delbert Phenix M.D.   On: 10/20/2020 08:56   DG Chest Portable 1 View  Result Date: 10/17/2020 CLINICAL DATA:  Shortness of breath and anxiety EXAM: PORTABLE CHEST 1 VIEW COMPARISON:  04/30/2020 FINDINGS: Normal heart size and mediastinal contours. Vague nodular density at the right apex is attributed to overlap of the first and fourth ribs. No mass in this area seen by recent MRI. No acute infiltrate or edema. No effusion or pneumothorax. No acute osseous findings. IMPRESSION: No evidence of active disease. Electronically Signed   By: Marnee Spring M.D.   On: 10/17/2020 04:02     Time coordinating discharge: Over 30 minutes    Lewie Chamber, MD  [facility] 10/24/2020, 11:47 AM

## 2020-10-24 NOTE — Progress Notes (Signed)
Reviewed all discharge instructions with pt. Pt verbalized understanding. Case Vassell, Yancey Flemings, RN

## 2020-10-24 NOTE — Plan of Care (Signed)
  Problem: Health Behavior/Discharge Planning: Goal: Ability to manage health-related needs will improve Outcome: Adequate for Discharge   Problem: Clinical Measurements: Goal: Respiratory complications will improve Outcome: Adequate for Discharge Goal: Cardiovascular complication will be avoided Outcome: Adequate for Discharge   Problem: Coping: Goal: Level of anxiety will decrease Outcome: Adequate for Discharge

## 2020-10-24 NOTE — TOC Transition Note (Addendum)
Transition of Care [facility]) - CM/SW Discharge Note   Patient Details  Name: Samuel Ewing MRN: [id number] Date of Birth: 1970-09-15  Transition of Care [facility]) CM/SW Contact:  Golda Acre, RN Phone Number: 10/24/2020, 12:02 PM   Clinical Narrative:    o2 ordered through [facility] for home physical therapy Unable to do per rep Eusebio Me to [facility].        Patient Goals and CMS Choice        Discharge Placement                       Discharge Plan and Services                                     Social Determinants of Health (SDOH) Interventions     Readmission Risk Interventions Readmission Risk Prevention Plan 10/21/2020  Transportation Screening Complete  Medication Review (RN Care Manager) Complete  PCP or Specialist appointment within 3-5 days of discharge Complete  HRI or Home Care Consult Complete  SW Recovery Care/Counseling Consult Complete  Palliative Care Screening Not Applicable  Skilled Nursing Facility Not Applicable

## 2020-10-26 ENCOUNTER — Emergency Department (HOSPITAL_BASED_OUTPATIENT_CLINIC_OR_DEPARTMENT_OTHER)
Admission: EM | Admit: 2020-10-26 | Discharge: 2020-10-26 | Disposition: A | Discharge status: Home / Self Care | Payer: 59 | Attending: Emergency Medicine | Admitting: Emergency Medicine

## 2020-10-26 ENCOUNTER — Other Ambulatory Visit: Payer: Self-pay

## 2020-10-26 DIAGNOSIS — Z79899 Other long term (current) drug therapy: Secondary | ICD-10-CM | POA: Diagnosis not present

## 2020-10-26 DIAGNOSIS — I10 Essential (primary) hypertension: Secondary | ICD-10-CM | POA: Insufficient documentation

## 2020-10-26 DIAGNOSIS — F419 Anxiety disorder, unspecified: Secondary | ICD-10-CM | POA: Diagnosis present

## 2020-10-26 MED ORDER — DIAZEPAM 5 MG PO TABS: 5.0000 mg | ORAL_TABLET | Freq: Two times a day (BID) | ORAL | 0 refills | Status: DC

## 2020-10-26 MED ORDER — LORAZEPAM 2 MG/ML IJ SOLN
2.0000 mg | Freq: Once | INTRAMUSCULAR | Status: AC
  Administered 2020-10-26: 2 mg via INTRAMUSCULAR
  Filled 2020-10-26: qty 1

## 2020-10-26 NOTE — ED Notes (Signed)
Pt endorses feeling anxious, need to pace in room. Visitor in room with patient to assist with calming.

## 2020-10-26 NOTE — Discharge Instructions (Signed)
You have been treated with Valium here in the ED.  This can make you medically drowsy.  Do not drive.  He may continue with your home medications, as directed.  I have also prescribed you a very short course of continued Valium until you can be evaluated by your primary care provider.  You were just admitted to the hospital for multifocal pneumonia and require hospital follow-up appointment with your primary care provider.  You take high-dose narcotics and are on multiple sedative medications.  You need to have medication adjustments and a more sustainable long-term plan coordinated with your primary care provider.  I also would like for you to discuss possible sleep study given that you are continuing to use supplemental oxygen, only at night.  Return to the ER or seek immediate medical attention should you experience any new or worsening symptoms.

## 2020-10-26 NOTE — ED Provider Notes (Addendum)
[facility] EMERGENCY DEPARTMENT Provider Note   CSN: [id number] Arrival date & time: 10/26/20  7253     History Chief Complaint  Patient presents with  . Anxiety    Samuel Ewing is a 50 y.o. male with past medical history of HTN, HLD, recent hospitalist admission for parainfluenza multifocal pneumonia discharged home 10/24/2020 and most notably anxiety who presents to the ED with complaints of severe anxiety attack.  I reviewed patient's medical record and he was discharged home with 2 L supplemental oxygen.  He was also discharged home with continued prednisone and albuterol.  He had been noted to be anxious at baseline during his admission, required increased doses of Ativan while hospitalized and was encouraged to continue with his BuSpar and Cymbalta at time of discharge.  On my examination, patient is pacing the room and seemingly wired.  I reviewed his PDMP and he takes 4 mg Dilaudid 3-4 times daily in addition to 0.5 mg Ativan and multiple other medications for anxiety.  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  He states that his symptoms of shortness of breath and cough have improved largely since his discharge from the hospital, but still uses 2 L supplemental oxygen at night.  He is not requiring any supplemental oxygen here in the ED today and is exhibiting no increased work of breathing.  He states that he simply needs to have Valium because that is what works for him.  He has been up since yesterday and has not been able to sleep due to his anxiety symptoms.  He plans to follow-up with his primary care provider for medication management, but has been unable to do so this weekend and needs something to help him until he can be seen in the next couple days.  He denies any medical complaints, specifically any recent fevers, chills, chest pain, shortness of breath, palpitations, or other symptoms.  I informed him  that albuterol and prednisone taper can exacerbate his anxiety symptoms, he states that he is no longer taking the prednisone taper.  HPI     Past Medical History:  Diagnosis Date  . Anxiety   . Hypertension   . Migraine     Patient Active Problem List   Diagnosis Date Noted  . Physical deconditioning 10/22/2020  . Pneumonia of both lungs due to infectious organism 10/19/2020  . Sepsis with acute hypoxic respiratory failure (HCC) 10/19/2020  . Prediabetes 10/19/2020  . Pneumonia 10/19/2020  . Mild episode of recurrent major depressive disorder (HCC) 06/09/2020  . Essential hypertension 06/06/2020  . Hyperlipidemia 06/06/2020  . Migraine with aura and without status migrainosus, not intractable 06/06/2020  . Generalized anxiety disorder 06/06/2020  . Hypothyroidism 06/06/2020  . 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 Moderate episode of recurrent major depressive disorder (HCC) 06/06/2020    Past Surgical History:  Procedure Laterality Date  . BACK SURGERY    . CERVICAL SPINE SURGERY         Family History  Problem Relation Age of Onset  . Heart failure Mother   . Heart failure Father   . Heart attack Father     Social History   Tobacco Use  . Smoking status: Never Smoker  . Smokeless tobacco: Never Used  Vaping Use  . Vaping Use: Every day  . Substances: Nicotine  Substance Use Topics  . Alcohol use: Never  . Drug use: Not Currently    Types: Marijuana  Home Medications Prior to Admission medications   Medication Sig Start Date End Date Taking? Authorizing Provider  albuterol (VENTOLIN HFA) 108 (90 Base) MCG/ACT inhaler Inhale 2 puffs into the lungs every 4 (four) hours as needed for wheezing or shortness of breath. 10/24/20  Yes Lewie Chamber, MD  amLODipine (NORVASC) 10 MG tablet Take 1 tablet (10 mg total) by mouth daily. 10/06/20  Yes Marcine Matar, MD  busPIRone (BUSPAR) 30 MG tablet Take 1 tablet (30 mg total) by mouth 2 (two) times daily. 09/08/20   Yes Toy Cookey E, NP  diazepam (VALIUM) 5 MG tablet Take 1 tablet (5 mg total) by mouth 2 (two) times daily for 5 doses. 10/26/20 10/29/20 Yes Lorelee New, PA-C  DULoxetine (CYMBALTA) 60 MG capsule Take 2 capsules (120 mg total) by mouth daily. Patient taking differently: Take 60 mg by mouth 2 (two) times daily. 09/08/20  Yes Toy Cookey E, NP  gabapentin (NEURONTIN) 300 MG capsule Take 600 mg by mouth 2 (two) times daily. 2 capsules in the morning 2 capsules in the evening 08/04/20  Yes [provider]  HYDROmorphone (DILAUDID) 4 MG tablet Take 8 mg by mouth every 6 (six) hours as needed for severe pain. 09/22/20  Yes [provider]  hydrOXYzine (ATARAX/VISTARIL) 50 MG tablet Take 1 tablet (50 mg total) by mouth 3 (three) times daily as needed for anxiety. 09/08/20  Yes Toy Cookey E, NP  levothyroxine (SYNTHROID) 50 MCG tablet Take 1 tablet (50 mcg total) by mouth daily. 10/06/20  Yes Marcine Matar, MD  lisinopril (ZESTRIL) 20 MG tablet Take 1 tablet (20 mg total) by mouth daily. 10/06/20  Yes Marcine Matar, MD  LORazepam (ATIVAN) 0.5 MG tablet Take 1 tablet (0.5 mg total) by mouth daily as needed for anxiety. 10/06/20  Yes Toy Cookey E, NP  meloxicam (MOBIC) 15 MG tablet Take 1 tablet (15 mg total) by mouth daily. 07/19/20  Yes Renne Crigler, PA-C  methocarbamol (ROBAXIN) 500 MG tablet Take 1 tablet (500 mg total) by mouth every 6 (six) hours as needed for muscle spasms. 09/30/20  Yes Dione Booze, MD  promethazine (PHENERGAN) 12.5 MG tablet Take 12.5 mg by mouth every 6 (six) hours as needed for nausea or vomiting. 09/12/20  Yes [provider]  SUMAtriptan (IMITREX) 100 MG tablet Take 100 mg by mouth daily as needed for migraine. May repeat in 2 hours if headache persists or recurs.   Yes [provider]  ziprasidone (GEODON) 40 MG capsule Take 1 capsule (40 mg total) by mouth 2 (two) times daily with a meal. 09/08/20  Yes Toy Cookey E, NP  atorvastatin (LIPITOR) 20 MG tablet Take 1 tablet (20 mg total) by mouth daily. 10/06/20   Marcine Matar, MD  lidocaine (LIDODERM) 5 % Place 1 patch onto the skin daily. Remove & Discard patch within 12 hours or as directed by MD 08/17/20   Caccavale, Sophia, PA-C  predniSONE (DELTASONE) 20 MG tablet Take 2 tablets (40 mg total) by mouth daily with breakfast for 3 days. 10/24/20 10/27/20  Lewie Chamber, MD    Allergies    Morphine and related  Review of Systems   Review of Systems  All other systems reviewed and are negative.   Physical Exam Updated Vital Signs BP (!) 159/106 (BP Location: Right Arm)   Pulse (!) 111   Temp 98.3 F (36.8 C) (Oral)   Resp 20   Ht 5\' 10"  (1.778 m)   Wt  83.5 kg   SpO2 96%   BMI 26.40 kg/m   Physical Exam Vitals and nursing note reviewed. Exam conducted with a chaperone present.  Constitutional:      General: He is not in acute distress.    Appearance: He is not toxic-appearing.     Comments: Anxious, pacing.  HENT:     Head: Normocephalic and atraumatic.  Eyes:     General: No scleral icterus.    Conjunctiva/sclera: Conjunctivae normal.  Cardiovascular:     Rate and Rhythm: Normal rate and regular rhythm.     Pulses: Normal pulses.  Pulmonary:     Effort: Pulmonary effort is normal. No respiratory distress.  Musculoskeletal:        General: Normal range of motion.     Cervical back: Normal range of motion.  Skin:    General: Skin is dry.  Neurological:     General: No focal deficit present.     Mental Status: He is alert and oriented to person, place, and time.     GCS: GCS eye subscore is 4. GCS verbal subscore is 5. GCS motor subscore is 6.  Psychiatric:        Mood and Affect: Mood normal.        Behavior: Behavior normal.        Thought Content: Thought content normal.     ED Results / Procedures / Treatments   Labs (all labs ordered are listed, but only abnormal results are displayed) Labs Reviewed - No  data to display  EKG None  Radiology No results found.  Procedures Procedures   Medications Ordered in ED Medications  LORazepam (ATIVAN) injection 2 mg (has no administration in time range)    ED Course  I have reviewed the triage vital signs and the nursing notes.  Pertinent labs & imaging results that were available during my care of the patient were reviewed by me and considered in my medical decision making (see chart for details).    MDM Rules/Calculators/A&P                          Ludwig LeanMatthew S Iglesia was evaluated in Emergency Department on 10/26/2020 for the symptoms described in the history of present illness. He was evaluated in the context of the global COVID-19 pandemic, which necessitated consideration that the patient might be at risk for infection with the SARS-CoV-2 virus that causes COVID-19. Institutional protocols and algorithms that pertain to the evaluation of patients at risk for COVID-19 are in a state of rapid change based on information released by regulatory bodies including the CDC and federal and state organizations. These policies and algorithms were followed during the patient's care in the ED.  I personally reviewed patient's medical chart and all notes from triage and staff during today's encounter. I have also ordered and reviewed all labs and imaging that I felt to be medically necessary in the evaluation of this patient's complaints and with consideration of their physical exam. If needed, translation services were available and utilized.   Patient is simply requesting medication management for his anxiety.  I cautioned him on the risks of combining multiple benzodiazepines with high potency narcotics, risk of falls and sedation/pulmonary compromise.  He voices understanding.  He states that he has been taking his medications for a while and is opiate tolerant.  He denies having a contract in place.  He is simply requesting a few pills to help him rest  and get sleep until he can be evaluated by his primary care provider.  His vital signs are stable and within normal limits aside from mildly elevated blood pressure and heart rate.  Elevated heart rate likely related to his anxiety, lack of sleep, and outpatient prednisone/albuterol.  He denies any fevers at home.  He is not exhibiting any increased work of breathing.  Lower suspicion for serotonin syndrome given lack of fever or psychosis.  I emphasized the importance of having outpatient medication management.  He is on an exceedingly high dose of narcotic medications and I am concerned about risks with polypharmacy.  However, I also would like for him to be able to sleep and get rest.  He states that Valium helps him tremendously.  I feel as though this is appropriate for short course.  I do not feel as though laboratory work-up or imaging is warranted.  I discussed with Dr. Fredderick Phenix who agrees with assessment plan.  ED return precautions discussed.  Patient and his friend who is at bedside voiced understanding and are agreeable to the plan.  His friend at bedside is driving.  Final Clinical Impression(s) / ED Diagnoses Final diagnoses:  Anxiety    Rx / DC Orders ED Discharge Orders         Ordered    diazepam (VALIUM) 5 MG tablet  2 times daily        10/26/20 1137           Lorelee New, PA-C 10/26/20 1138    Lorelee New, PA-C 10/26/20 1139    Rolan Bucco, MD 10/26/20 1434

## 2020-10-26 NOTE — ED Triage Notes (Signed)
Severe Anxiety that has persisted since 11pm last night.    Took Ativan 1mg  po at 7a.m. with no relief.  Discharged from 6 day  hospital stay for pneumonia on Thursday.  Had neck surgery April 1st. 2022

## 2020-10-27 ENCOUNTER — Telehealth: Payer: Self-pay

## 2020-10-27 NOTE — Telephone Encounter (Signed)
Transition Care Management Follow-up Telephone Call  Date of discharge and from where: 10/24/2020, [facility]   10/26/2020, ED visit for anxiety  How have you been since you were released from the hospital? He said he is not feeling so well, but is managing.  He was given a prescription for diazepam when he was in the ED yesterday and said that has been helping his anxiety today.   Any questions or concerns? Yes - he is concerned about seeing Dr Laural Benes. His appointment for 10/28/2020 was cancelled and he was not aware of that.  I was able to schedule him for a virtual visit with Dr Laural Benes tomorrow and he was very pleased with that appointment.   He said that he needs to make an appointment for behavioral health follow up. He explained that he was seen at [facility] but he has a hard time getting in touch with them and needs to schedule an other appointment   Items Reviewed:  Did the pt receive and understand the discharge instructions provided? Yes  - but he did not have then with him at the time of this call.   Medications obtained and verified? Yes - he said he has all medications. On discharge from the hospital, he was prescribed prednisone and albuterol inhaler. He said that the doctor in the ED yesterday told him to stop taking those meds.  He has all of the other medications on the list, in addition to diazepam which was prescribed from the ED yesterday. He did not have any questions about the med regime   Other? No   Any new allergies since your discharge? No   Dietary orders reviewed? No  Do you have support at home? currently staying with a friend because he is anxious about bring alone.   Home Care and Equipment/Supplies: Were home health services ordered? yes If so, what is the name of the agency? [facility]  Has the agency set up a time to come to the patient's home? no Were any new equipment or medical supplies ordered?  Yes: O2 What is the name  of the medical supply agency? [employer] Were you able to get the supplies/equipment? yes Do you have any questions related to the use of the equipment or supplies? No   He said he has been using the O2 @ 2L when needed, mostly at night.     Functional Questionnaire: (I = Independent and D = Dependent) ADLs: independent  Follow up appointments reviewed:   PCP Hospital f/u appt confirmed? Yes  - Dr Laural Benes 10/28/2020.  Marland Kitchen  Specialist Hospital f/u appt confirmed? No , none scheduled at this time   Are transportation arrangements needed? No   If their condition worsens, is the pt aware to call PCP or go to the Emergency Dept.? Yes  Was the patient provided with contact information for the PCP's office or ED? Yes  Was to pt encouraged to call back with questions or concerns? Yes

## 2020-10-28 ENCOUNTER — Other Ambulatory Visit: Payer: Self-pay

## 2020-10-28 ENCOUNTER — Ambulatory Visit: Payer: 59 | Admitting: Internal Medicine

## 2020-10-28 ENCOUNTER — Ambulatory Visit: Payer: 59 | Attending: Internal Medicine | Admitting: Internal Medicine

## 2020-10-28 DIAGNOSIS — J122 Parainfluenza virus pneumonia: Secondary | ICD-10-CM | POA: Diagnosis not present

## 2020-10-28 DIAGNOSIS — J9601 Acute respiratory failure with hypoxia: Secondary | ICD-10-CM | POA: Diagnosis not present

## 2020-10-28 DIAGNOSIS — R7303 Prediabetes: Secondary | ICD-10-CM | POA: Diagnosis not present

## 2020-10-28 DIAGNOSIS — D649 Anemia, unspecified: Secondary | ICD-10-CM

## 2020-10-28 DIAGNOSIS — F411 Generalized anxiety disorder: Secondary | ICD-10-CM

## 2020-10-28 DIAGNOSIS — Z09 Encounter for follow-up examination after completed treatment for conditions other than malignant neoplasm: Secondary | ICD-10-CM

## 2020-10-28 NOTE — Addendum Note (Signed)
Addended by: Jonah Blue B on: 10/28/2020 06:21 PM   Modules accepted: Level of Service

## 2020-10-28 NOTE — Progress Notes (Signed)
Virtual Visit via Telephone Note  I connected with Samuel Ewing on 10/28/2020 at 10:21 a.m by telephone and verified that I am speaking with the correct person using two identifiers.  Per caseworker, patient requested virtual visit due to transportation issue.  However today patient tells me that he thought it was supposed to be in person.  However he agreed to doing this visit via telephone.  Location: Patient: home Provider: office  Participants: Myself Patient CMA: Ms. Reginia Forts interpreter:   I discussed the limitations, risks, security and privacy concerns of performing an evaluation and management service by telephone and the availability of in person appointments. I also discussed with the patient that there may be a patient responsible charge related to this service. The patient expressed understanding and agreed to proceed.   History of Present Illness: Patient with history of hypertension, HL, migraines, anxiety disorder, hypothyroidism, family history of heart disease in both parents.  Today's visit is transition of care.  Date of hospitalization: 4/23-29/2022 Date of call from case worker: 10/27/2020  Patient hospitalized with hypoxic respiratory failure secondary to bilateral pneumonia.  COVID and flu swabs negative.  Positive for parainfluenza virus.  Treated with antibiotics, oxygen and prednisone.  By the time of discharge, he had completed antibiotics.  He was discharged home on 2 L O2 to use with exertion, prednisone and albuterol inhaler.  Noted to have prediabetes with A1c of 6.4 during hospitalization.  Blood sugars were running high as he was on steroid.  I note that he had worsening anemia with hemoglobin/hematocrit trending from 12.6/37 to 9.3/27.8.  Today: Pneumonia: Patient reports he is doing better.  He is not had any fever.  Cough is decreased.  He still feels as though he has some mucus in his chest that needs to break up.  States he was taking Mucinex  but was told not to do so as it may elevate his blood pressure. -He is using his oxygen at night and after ambulation if he feels short of breath.  He walked his dog around the block and felt short of breath when he came back so he put on the oxygen.  He will have a pulse ox tomorrow to be able to check his oxygen level. -He quit smoking 1 year ago but has vape for the past year using nicotine products.  Reports his anxiety has been worse since being diagnosed with pneumonia.  Feels that his current anxiety medications including lorazepam, BuSpar and Cymbalta are not controlling his anxiety enough.  He has an appointment with his psychiatrist tomorrow.  Current Outpatient Medications  Medication Instructions  . albuterol (VENTOLIN HFA) 108 (90 Base) MCG/ACT inhaler 2 puffs, Inhalation, Every 4 hours PRN  . amLODipine (NORVASC) 10 mg, Oral, Daily  . atorvastatin (LIPITOR) 20 mg, Oral, Daily  . busPIRone (BUSPAR) 30 mg, Oral, 2 times daily  . diazepam (VALIUM) 5 mg, Oral, 2 times daily  . DULoxetine (CYMBALTA) 120 mg, Oral, Daily  . gabapentin (NEURONTIN) 600 mg, Oral, 2 times daily, 2 capsules in the morning 2 capsules in the evening  . HYDROmorphone (DILAUDID) 8 mg, Oral, Every 6 hours PRN  . hydrOXYzine (ATARAX/VISTARIL) 50 mg, Oral, 3 times daily PRN  . levothyroxine (SYNTHROID) 50 mcg, Oral, Daily  . lidocaine (LIDODERM) 5 % 1 patch, Transdermal, Every 24 hours, Remove & Discard patch within 12 hours or as directed by MD  . lisinopril (ZESTRIL) 20 mg, Oral, Daily  . LORazepam (ATIVAN) 0.5 mg, Oral, Daily  PRN  . meloxicam (MOBIC) 15 mg, Oral, Daily  . methocarbamol (ROBAXIN) 500 mg, Oral, Every 6 hours PRN  . promethazine (PHENERGAN) 12.5 mg, Oral, Every 6 hours PRN  . SUMAtriptan (IMITREX) 100 mg, Oral, Daily PRN, May repeat in 2 hours if headache persists or recurs.   . ziprasidone (GEODON) 40 mg, Oral, 2 times daily with meals      Observations/Objective: Lab Results  Component  Value Date   WBC 7.7 10/21/2020   HGB 9.3 (L) 10/21/2020   HCT 27.8 (L) 10/21/2020   MCV 94.6 10/21/2020   PLT 226 10/21/2020     Chemistry      Component Value Date/Time   NA 140 10/22/2020 0429   K 4.0 10/22/2020 0429   CL 104 10/22/2020 0429   CO2 28 10/22/2020 0429   BUN 20 10/22/2020 0429   CREATININE 0.73 10/22/2020 0429      Component Value Date/Time   CALCIUM 8.8 (L) 10/22/2020 0429   ALKPHOS 48 10/20/2020 0529   AST 30 10/20/2020 0529   ALT 18 10/20/2020 0529   BILITOT 0.2 (L) 10/20/2020 0529   BILITOT 0.4 06/18/2020 1003       Assessment and Plan: 1. Hospital discharge follow-up  2. Pneumonia due to parainfluenza virus 3. Acute hypoxemic respiratory failure (HCC) -Advised patient to check his oxygen level on room air at rest and with ambulation several times daily once he gets his pulse ox tomorrow.  If he finds that his oxygen level is dropping below 92 or he feels short of breath he should use the oxygen to keep his level above 95%.  Once his blood oxygen level is persistently 95% or above without having to use supplemental oxygen, then we can discontinue it.  We will have him come in in 4 to 6 weeks for evaluation. -Advised he can use Mucinex as long as it is not Mucinex D.  He can also use Coricidin HBP  4. Prediabetes Discussed and encourage healthy eating habits.  Recheck him in several months.  However this was most likely due to to steroid use during hospitalization.  5. Normocytic anemia When he comes for follow-up in 4 to 6 weeks, we will recheck a CBC with iron studies.  6. GAD (generalized anxiety disorder) Keep follow-up appointment with psychiatrist.   Follow Up Instructions: 4-6 wks   I discussed the assessment and treatment plan with the patient. The patient was provided an opportunity to ask questions and all were answered. The patient agreed with the plan and demonstrated an understanding of the instructions.   The patient was advised to  call back or seek an in-person evaluation if the symptoms worsen or if the condition fails to improve as anticipated.  I  Spent 18 minutes on this telephone encounter  Jonah Blue, MD

## 2020-10-29 ENCOUNTER — Encounter (HOSPITAL_COMMUNITY): Payer: Self-pay | Admitting: Psychiatry

## 2020-10-29 ENCOUNTER — Other Ambulatory Visit: Payer: Self-pay

## 2020-10-29 ENCOUNTER — Telehealth (INDEPENDENT_AMBULATORY_CARE_PROVIDER_SITE_OTHER): Payer: 59 | Admitting: Psychiatry

## 2020-10-29 DIAGNOSIS — F330 Major depressive disorder, recurrent, mild: Secondary | ICD-10-CM | POA: Diagnosis not present

## 2020-10-29 DIAGNOSIS — F411 Generalized anxiety disorder: Secondary | ICD-10-CM

## 2020-10-29 MED ORDER — DULOXETINE HCL 60 MG PO CPEP: 60.0000 mg | ORAL_CAPSULE | Freq: Two times a day (BID) | ORAL | 2 refills | Status: DC

## 2020-10-29 MED ORDER — ZIPRASIDONE HCL 40 MG PO CAPS: 40.0000 mg | ORAL_CAPSULE | Freq: Two times a day (BID) | ORAL | 2 refills | Status: DC

## 2020-10-29 MED ORDER — BUSPIRONE HCL 30 MG PO TABS: 30.0000 mg | ORAL_TABLET | Freq: Two times a day (BID) | ORAL | 2 refills | Status: DC

## 2020-10-29 MED ORDER — DIAZEPAM 5 MG PO TABS: 5.0000 mg | ORAL_TABLET | Freq: Every day | ORAL | 0 refills | Status: DC

## 2020-10-29 NOTE — Progress Notes (Signed)
BH MD/PA/NP OP Progress Note Virtual Visit via Video Note  I connected with Samuel AuerbachMatthew S Ewing on 10/29/20 at  4:00 PM EDT by a video enabled telemedicine application and verified that I am speaking with the correct person using two identifiers.  Location: Patient: Home Provider: Clinic   I discussed the limitations of evaluation and management by telemedicine and the availability of in person appointments. The patient expressed understanding and agreed to proceed.  I provided 30 minutes of non-face-to-face time during this encounter.    10/29/2020 4:23 PM Samuel LeanMatthew S Ewing  MRN:  [id number]  Chief Complaint: "My anxiety has been off the charts"  HPI: 50 year old male seen today for follow up psychiatric evaluation.  He has a psychiatric history of anxiety and depression. He is currently managed on BuSpar 30 mg twice daily, Cymbalta 60 mg twice daily, hydroxyzine 50 mg three times daily, Valium 5 mg twice daily, and Geodon 40 twice daily (which he notes he takes for anxiety).    He notes that he only takes 5 mg of Valium at night.  He notes his medications are somewhat effective in managing his psychiatric conditions  Today he is well-groomed, pleasant, cooperative, engaged in conversation, and maintained eye contact.    On exam patient was observed to be utilizing oxygen.  He was wearing a nasal cannula.  He notes that he was diagnosed with double pneumonia and currently requires oxygen.  He notes that his symptoms are improving.  Patient informed Clinical research associatewriter that his anxiety has been off the charts.  He recently visited the emergency room on 10/26/2020 and was given Valium 5 mg twice daily.  Ativan 0.5 mg was discontinued at that time.  He notes that since being on the Valium his anxiety has somewhat improved.  Provider informed patient that she is concerned about respiratory distress on Valium 5 mg twice daily.  Provider also informed patient that being on a benzodiazepine and an opioid at the same  time is not recommended especially with his current health comorbidities.    Today he notes that his anxiety and depression are still high.  Provider conducted a GAD-7 and patient scored 21, at his last visit he scored an 18.  Provider also conducted a PHQ-9 and patient scored a 21, at his last visit he scored a 12.  He notes that he was given albuterol in the ED and discharged with it however he notes that he discontinued it because it was increasing his anxiety.  Today he denies SI/HI/AH or paranoia.    Patient notes that when he was in the hospital he experienced visual hallucinations.  He notes that he saw water coming out of his pocket that was attached to his bed.  He also notes that recently he has seen the walls paper move.  He notes that he feels like his hallucinations are exacerbated by the number of medications he is on.  He endorses adequate appetite.  Today patient is agreeable to reducing Valium 5 mg twice daily to 5 mg nightly to help manage anxiety and reduce potential drug-induced hallucination.  Provider informed patient that Valium is a short-term treatment for his anxiety and notes that in the future will be reduced or discontinued.  He endorsed understanding.  Provider discussed patient starting therapy to help manage his anxiety which she was agreeable to.  He will continue all other medications as prescribed.  No other concerns noted at this time.  Visit Diagnosis:    ICD-10-CM   1.  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 Anxiety   . Hypertension   . Migraine     Past Surgical History:  Procedure Laterality Date  . BACK SURGERY    . CERVICAL SPINE SURGERY      Family Psychiatric History:  Paternal grandmother depression  Family History:  Family History  Problem Relation Age of Onset  . Heart failure Mother   . Heart failure Father   . Heart attack Father     Social History:  Social History   Socioeconomic History  . Marital status: Single    Spouse name: Not on file  . Number of children: Not on file  . Years of education: Not on file  . Highest education level: Not on file  Occupational History  . Not on file  Tobacco Use  . Smoking status: Never Smoker  . Smokeless tobacco: Never Used  Vaping Use  . Vaping Use: Every day  . Substances: Nicotine  Substance and Sexual Activity  . Alcohol use: Never  . Drug use: Not Currently    Types: Marijuana  . Sexual activity: Not on file  Other Topics Concern  . Not on file  Social History Narrative  . Not on file   Social Determinants of Health   Financial Resource Strain: Not on file  Food Insecurity: Not on file  Transportation Needs: Not on file  Physical Activity: Not on file  Stress: Not on file  Social Connections: Not on file    Allergies:  Allergies  Allergen Reactions  . Morphine And Related     Metabolic Disorder Labs: Lab Results  Component Value Date   HGBA1C 6.4 (H) 10/19/2020   MPG 136.98 10/19/2020   No results found for: PROLACTIN Lab Results  Component Value Date   CHOL 120 06/18/2020   TRIG 101 06/18/2020   HDL 42 06/18/2020   CHOLHDL 2.9 06/18/2020   LDLCALC 59 06/18/2020   Lab Results  Component Value Date   TSH 0.431 10/20/2020   TSH 1.770 06/18/2020    Therapeutic Level Labs: No results found for: LITHIUM No results found for: VALPROATE No components found for:  CBMZ  Current Medications: Current Outpatient Medications  Medication Sig Dispense Refill  .  albuterol (VENTOLIN HFA) 108 (90 Base) MCG/ACT inhaler Inhale 2 puffs into the lungs every 4 (four) hours as needed for wheezing or shortness of breath. 8 g 2  . amLODipine (NORVASC) 10 MG tablet Take 1 tablet (10 mg total) by mouth daily. 30 tablet 0  . atorvastatin (LIPITOR) 20 MG tablet Take 1 tablet (20 mg total) by mouth daily. 30 tablet 0  . busPIRone (BUSPAR) 30 MG tablet Take 1 tablet (30 mg total) by mouth 2 (two) times daily. 60 tablet 2  . diazepam (VALIUM) 5 MG tablet Take 1 tablet (5 mg total) by mouth daily. 30 tablet 0  . DULoxetine (CYMBALTA) 60 MG capsule Take 1 capsule (60 mg total) by mouth 2 (two)  times daily. 60 capsule 2  . gabapentin (NEURONTIN) 300 MG capsule Take 600 mg by mouth 2 (two) times daily. 2 capsules in the morning 2 capsules in the evening    . HYDROmorphone (DILAUDID) 4 MG tablet Take 8 mg by mouth every 6 (six) hours as needed for severe pain.    . hydrOXYzine (ATARAX/VISTARIL) 50 MG tablet Take 1 tablet (50 mg total) by mouth 3 (three) times daily as needed for anxiety. 90 tablet 2  . levothyroxine (SYNTHROID) 50 MCG tablet Take 1 tablet (50 mcg total) by mouth daily. 30 tablet 0  . lidocaine (LIDODERM) 5 % Place 1 patch onto the skin daily. Remove & Discard patch within 12 hours or as directed by MD 30 patch 0  . lisinopril (ZESTRIL) 20 MG tablet Take 1 tablet (20 mg total) by mouth daily. 30 tablet 0  . meloxicam (MOBIC) 15 MG tablet Take 1 tablet (15 mg total) by mouth daily. 10 tablet 0  . methocarbamol (ROBAXIN) 500 MG tablet Take 1 tablet (500 mg total) by mouth every 6 (six) hours as needed for muscle spasms. 40 tablet 0  . promethazine (PHENERGAN) 12.5 MG tablet Take 12.5 mg by mouth every 6 (six) hours as needed for nausea or vomiting.    . SUMAtriptan (IMITREX) 100 MG tablet Take 100 mg by mouth daily as needed for migraine. May repeat in 2 hours if headache persists or recurs.    . ziprasidone (GEODON) 40 MG capsule Take 1 capsule (40 mg total) by  mouth 2 (two) times daily with a meal. 60 capsule 2   No current facility-administered medications for this visit.     Musculoskeletal: Strength & Muscle Tone: Unable to assess due to telehealth visit Gait & Station: Unable to assess due to telehealth visit Patient leans: N/A  Psychiatric Specialty Exam: Review of Systems  There were no vitals taken for this visit.There is no height or weight on file to calculate BMI.  General Appearance: Well Groomed  Eye Contact:  Good  Speech:  Clear and Coherent and Slow  Volume:  Normal  Mood:  Anxious and Depressed  Affect:  Appropriate and Congruent  Thought Process:  Coherent, Goal Directed and Linear  Orientation:  Full (Time, Place, and Person)  Thought Content: WDL and Logical   Suicidal Thoughts:  No  Homicidal Thoughts:  No  Memory:  Immediate;   Good Recent;   Good Remote;   Good  Judgement:  Good  Insight:  Good  Psychomotor Activity:  Normal  Concentration:  Concentration: Good and Attention Span: Good  Recall:  Good  Fund of Knowledge: Good  Language: Good  Akathisia:  No  Handed:  Right  AIMS (if indicated): Right  Assets:  Communication Skills Desire for Improvement Financial Resources/Insurance Housing Social Support  ADL's:  Intact  Cognition: WNL  Sleep:  Good   Screenings: GAD-7   Flowsheet Row Video Visit from 10/29/2020 in [facility] Video Visit from 09/08/2020 in [facility] Office Visit from 06/09/2020 in [facility] Telemedicine from 06/06/2020 in [facility]  Total GAD-7 Score 21 18 16 17     PHQ2-9   Flowsheet Row Video Visit from 10/29/2020 in [facility] Office Visit from 09/10/2020 in [facility] at [facility] Video Visit from 09/08/2020 in [facility] Office Visit from 06/09/2020 in [facility]  Telemedicine from 06/06/2020 in  [facility]  PHQ-2 Total Score 6 4 2 6 3   PHQ-9 Total Score 21 19 12 17 15     Flowsheet Row Video Visit from 10/29/2020 in [facility] ED from 10/26/2020 in [facility] [place] [facility] ED to Hosp-Admission (Discharged) from 10/18/2020 in [facility] UR[facility]Y  C-SSRS RISK CATEGORY No Risk No Risk No Risk       Assessment and Plan: Patient endorses symptoms of anxiety, depression, and visual hallucinations.  He notes that he believes his hallucinations are because of the medications you is currently on. Today patient is agreeable to reducing Valium 5 mg twice daily to 5 mg nightly to help manage anxiety and reduce potential drug-induced hallucination.  Provider informed patient that Valium is a short-term treatment for his anxiety and notes that in the future will be reduced or discontinued.  He endorsed understanding.  Provider discussed patient starting therapy to help manage his anxiety which she was agreeable to.  He will continue all other medications as prescribed.  1. Generalized anxiety disorder  - Ambulatory referral to Social Work Continue- ziprasidone (GEODON) 40 MG capsule; Take 1 capsule (40 mg total) by mouth 2 (two) times daily with a meal.  Dispense: 60 capsule; Refill: 2 Continue- diazepam (VALIUM) 5 MG tablet; Take 1 tablet (5 mg total) by mouth daily.  Dispense: 30 tablet; Refill: 0 Continue- busPIRone (BUSPAR) 30 MG tablet; Take 1 tablet (30 mg total) by mouth 2 (two) times daily.  Dispense: 60 tablet; Refill: 2 Continue- DULoxetine (CYMBALTA) 60 MG capsule; Take 1 capsule (60 mg total) by mouth 2 (two) times daily.  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   Dispense: 60 tablet; Refill: 2 Continue- DULoxetine (CYMBALTA) 60 MG capsule; Take 1 capsule (60 mg total) by mouth 2 (two) times daily.  Dispense: 60 capsule; Refill: 2   Follow up in 3 months Follow-up with therapy  10/20/2020, NP 10/29/2020, 4:23 PM

## 2020-11-10 ENCOUNTER — Other Ambulatory Visit: Payer: Self-pay

## 2020-11-10 ENCOUNTER — Emergency Department (HOSPITAL_BASED_OUTPATIENT_CLINIC_OR_DEPARTMENT_OTHER)
Admission: EM | Admit: 2020-11-10 | Discharge: 2020-11-10 | Disposition: A | Discharge status: Home / Self Care | Payer: Self-pay | Attending: Emergency Medicine | Admitting: Emergency Medicine

## 2020-11-10 ENCOUNTER — Encounter (HOSPITAL_BASED_OUTPATIENT_CLINIC_OR_DEPARTMENT_OTHER): Payer: Self-pay | Admitting: *Deleted

## 2020-11-10 ENCOUNTER — Emergency Department (HOSPITAL_BASED_OUTPATIENT_CLINIC_OR_DEPARTMENT_OTHER): Payer: Self-pay

## 2020-11-10 DIAGNOSIS — R0602 Shortness of breath: Secondary | ICD-10-CM | POA: Insufficient documentation

## 2020-11-10 DIAGNOSIS — R079 Chest pain, unspecified: Secondary | ICD-10-CM | POA: Insufficient documentation

## 2020-11-10 DIAGNOSIS — F419 Anxiety disorder, unspecified: Secondary | ICD-10-CM | POA: Insufficient documentation

## 2020-11-10 DIAGNOSIS — E039 Hypothyroidism, unspecified: Secondary | ICD-10-CM | POA: Insufficient documentation

## 2020-11-10 DIAGNOSIS — Z79899 Other long term (current) drug therapy: Secondary | ICD-10-CM | POA: Insufficient documentation

## 2020-11-10 DIAGNOSIS — R0600 Dyspnea, unspecified: Secondary | ICD-10-CM | POA: Insufficient documentation

## 2020-11-10 DIAGNOSIS — I10 Essential (primary) hypertension: Secondary | ICD-10-CM | POA: Insufficient documentation

## 2020-11-10 DIAGNOSIS — R61 Generalized hyperhidrosis: Secondary | ICD-10-CM | POA: Insufficient documentation

## 2020-11-10 DIAGNOSIS — R000 Tachycardia, unspecified: Secondary | ICD-10-CM | POA: Insufficient documentation

## 2020-11-10 HISTORY — DX: Opioid use, unspecified with withdrawal: F11.93

## 2020-11-10 HISTORY — DX: Opioid dependence, uncomplicated: F11.20

## 2020-11-10 HISTORY — DX: Opioid dependence with withdrawal: F11.23

## 2020-11-10 HISTORY — DX: Depression, unspecified: F32.A

## 2020-11-10 LAB — BASIC METABOLIC PANEL
Anion gap: 10 (ref 5–15)
BUN: 19 mg/dL (ref 6–20)
CO2: 25 mmol/L (ref 22–32)
Calcium: 8.8 mg/dL — ABNORMAL LOW (ref 8.9–10.3)
Chloride: 99 mmol/L (ref 98–111)
Creatinine, Ser: 0.8 mg/dL (ref 0.61–1.24)
GFR, Estimated: >60 mL/min (ref >60)
Glucose, Bld: 234 mg/dL — ABNORMAL HIGH (ref 70–99)
Potassium: 4.1 mmol/L (ref 3.5–5.1)
Sodium: 134 mmol/L — ABNORMAL LOW (ref 135–145)

## 2020-11-10 LAB — CBC WITH DIFFERENTIAL/PLATELET
Abs Immature Granulocytes: 0.02 10*3/uL (ref 0.00–0.07)
Basophils Absolute: 0 10*3/uL (ref 0.0–0.1)
Basophils Relative: 0 %
Eosinophils Absolute: 0.1 10*3/uL (ref 0.0–0.5)
Eosinophils Relative: 1 %
HCT: 32.9 % — ABNORMAL LOW (ref 39.0–52.0)
Hemoglobin: 11.3 g/dL — ABNORMAL LOW (ref 13.0–17.0)
Immature Granulocytes: 0 %
Lymphocytes Relative: 17 %
Lymphs Abs: 1.2 10*3/uL (ref 0.7–4.0)
MCH: 30.7 pg (ref 26.0–34.0)
MCHC: 34.3 g/dL (ref 30.0–36.0)
MCV: 89.4 fL (ref 80.0–100.0)
Monocytes Absolute: 0.6 10*3/uL (ref 0.1–1.0)
Monocytes Relative: 9 %
Neutro Abs: 5.2 10*3/uL (ref 1.7–7.7)
Neutrophils Relative %: 73 %
Platelets: 189 10*3/uL (ref 150–400)
RBC: 3.68 MIL/uL — ABNORMAL LOW (ref 4.22–5.81)
RDW: 11.9 % (ref 11.5–15.5)
WBC: 7 10*3/uL (ref 4.0–10.5)
nRBC: 0 % (ref 0.0–0.2)

## 2020-11-10 LAB — TROPONIN I (HIGH SENSITIVITY)
Troponin I (High Sensitivity): <2 ng/L (ref <18)
Troponin I (High Sensitivity): <2 ng/L (ref <18)

## 2020-11-10 LAB — BRAIN NATRIURETIC PEPTIDE: B Natriuretic Peptide: 9.4 pg/mL (ref 0.0–100.0)

## 2020-11-10 LAB — D-DIMER, QUANTITATIVE: D-Dimer, Quant: 0.47 ug/mL-FEU (ref 0.00–0.50)

## 2020-11-10 IMAGING — DX DG CHEST 2V
2 series · 2 of 2 positions shown · non-contrast
Comparison: [DATE]

CLINICAL DATA: Shortness of breath

EXAM:
CHEST - 2 VIEW

[chest lat]
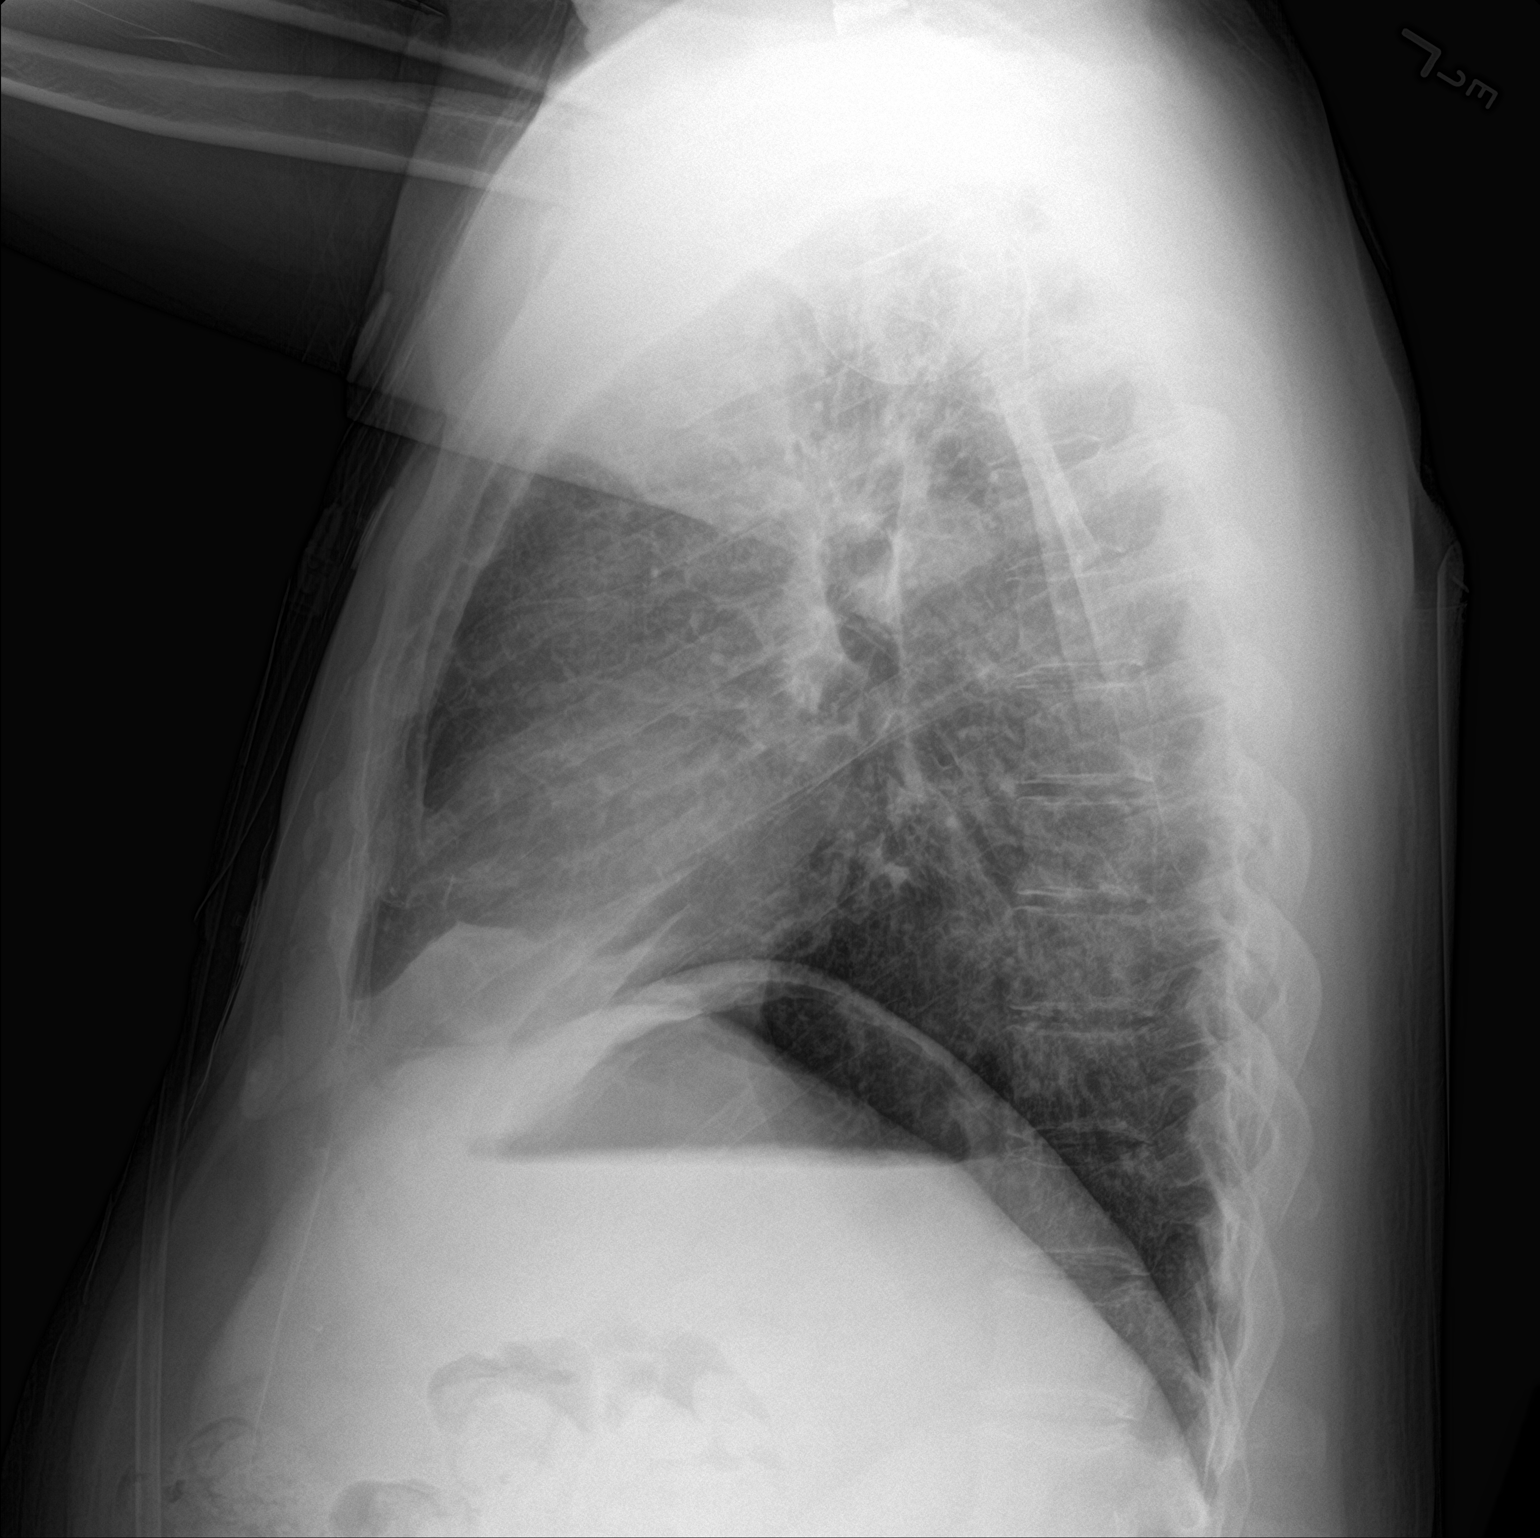

[chest ap]
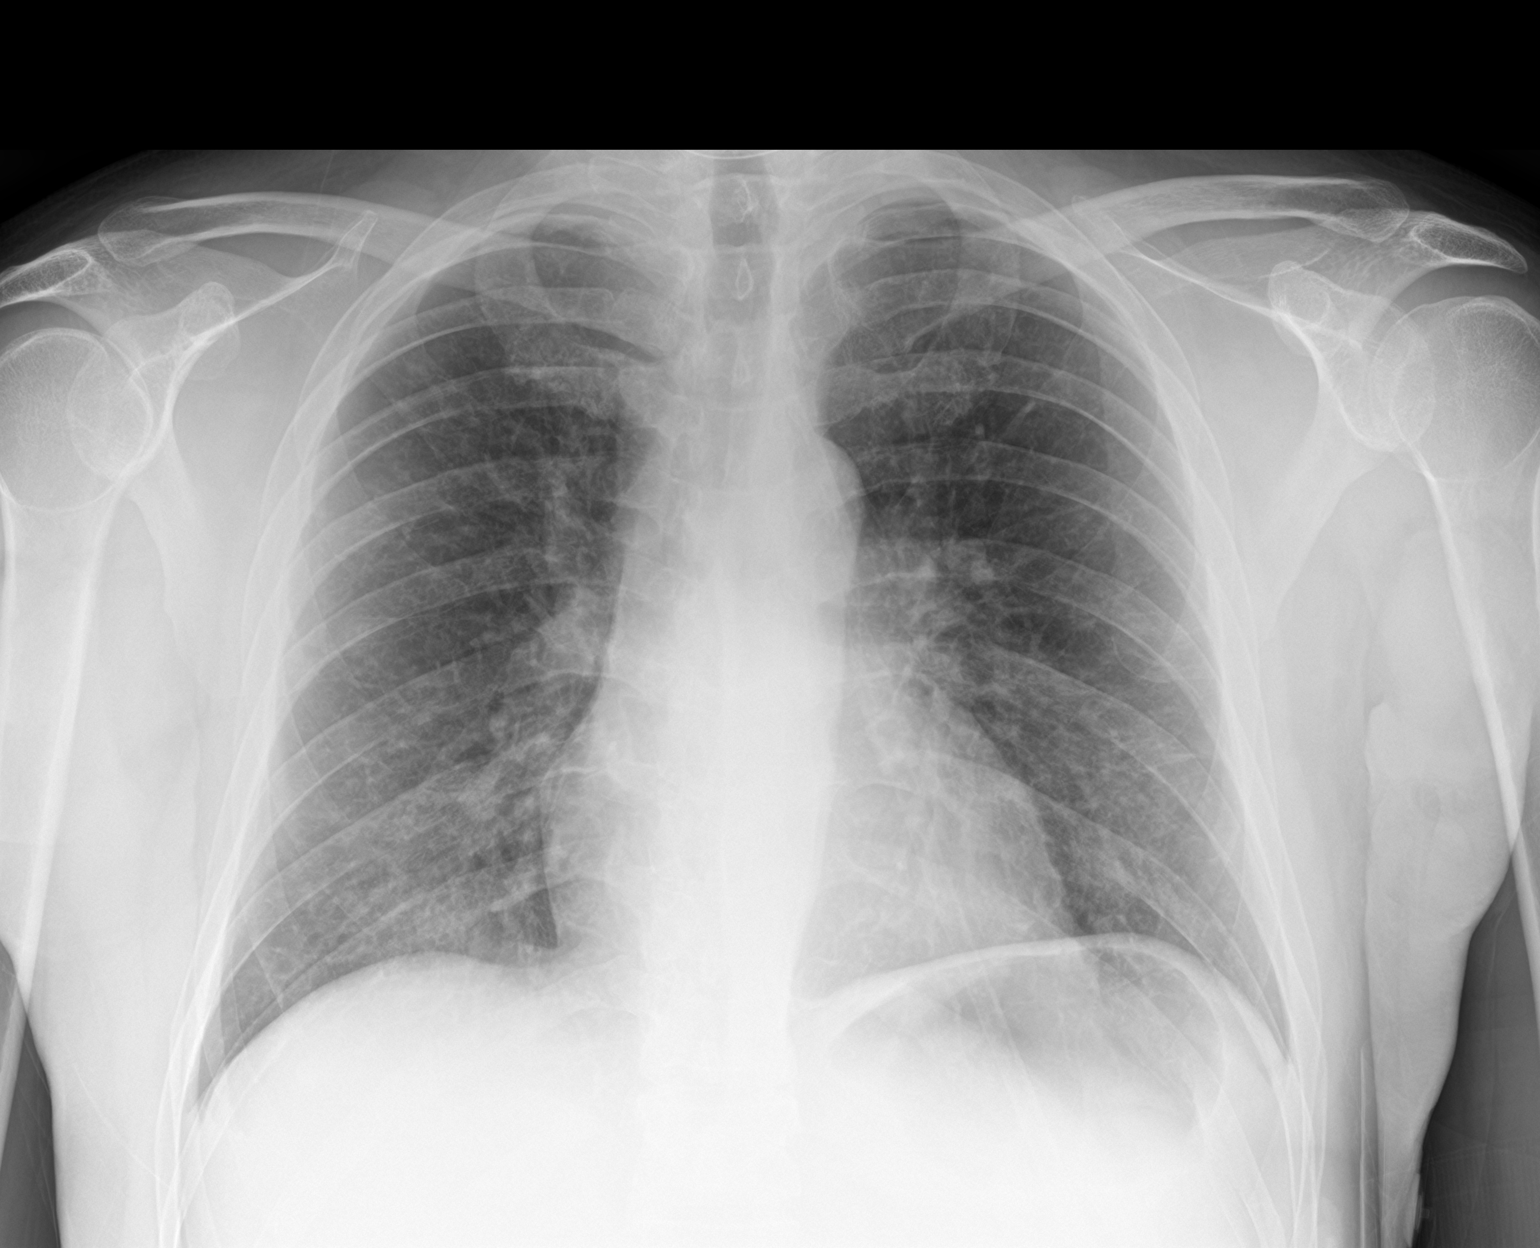

[2 of 2 positions shown; findings below may reference images not displayed]

FINDINGS: Cardiac shadow is within normal limits. Patchy airspace opacity seen
on the prior exam has resolved in the interval. No residual
infiltrate or effusion is noted. No acute bony abnormality noted.
IMPRESSION: No active cardiopulmonary disease.

## 2020-11-10 MED ORDER — LORAZEPAM 2 MG/ML IJ SOLN
1.0000 mg | Freq: Once | INTRAMUSCULAR | Status: AC
  Administered 2020-11-10: 1 mg via INTRAVENOUS
  Filled 2020-11-10: qty 1

## 2020-11-10 MED ORDER — KETOROLAC TROMETHAMINE 10 MG PO TABS
10.0000 mg | ORAL_TABLET | Freq: Once | ORAL | Status: AC
  Administered 2020-11-10: 10 mg via ORAL
  Filled 2020-11-10: qty 1

## 2020-11-10 NOTE — ED Provider Notes (Signed)
[facility] EMERGENCY DEPARTMENT Provider Note   CSN: [id number] Arrival date & time: 11/10/20  1754     History Chief Complaint  Patient presents with  . Chest Pain  . Anxiety    Samuel Ewing is a 50 y.o. male.  Samuel Ewing hospitalized between April 23 and October 24, 2020 for parainfluenza.  He was quite ill and was discharged home on 2 L of oxygen via nasal cannula.  Presented to the emergency department on May 1 for anxiety and was given a short course of Valium.  He was seen by a psychiatric nurse practitioner on May 4, and his Valium was reduced.  Today he presents for severe anxiety which she describes as diaphoresis, chest pain, and shortness of breath.  The history is provided by the patient.  Anxiety This is a recurrent problem. The current episode started more than 1 week ago. The problem occurs constantly. The problem has been gradually worsening. Associated symptoms include chest pain and shortness of breath. Pertinent negatives include no abdominal pain. Nothing aggravates the symptoms. Nothing relieves the symptoms. Treatments tried: prescription medications.       Past Medical History:  Diagnosis Date  . Anxiety   . Hypertension   . Migraine     Patient Active Problem List   Diagnosis Date Noted  . Physical deconditioning 10/22/2020  . Pneumonia of both lungs due to infectious organism 10/19/2020  . Sepsis with acute hypoxic respiratory failure (HCC) 10/19/2020  . Prediabetes 10/19/2020  . Pneumonia 10/19/2020  . Mild episode of recurrent major depressive disorder (HCC) 06/09/2020  . Essential hypertension 06/06/2020  . Hyperlipidemia 06/06/2020  . Migraine with aura and without status migrainosus, not intractable 06/06/2020  . Generalized anxiety disorder 06/06/2020  . Hypothyroidism 06/06/2020  . 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 Moderate episode of recurrent major depressive disorder (HCC) 06/06/2020    Past Surgical History:   Procedure Laterality Date  . BACK SURGERY    . CERVICAL SPINE SURGERY         Family History  Problem Relation Age of Onset  . Heart failure Mother   . Heart failure Father   . Heart attack Father     Social History   Tobacco Use  . Smoking status: Never Smoker  . Smokeless tobacco: Never Used  Vaping Use  . Vaping Use: Every day  . Substances: Nicotine  Substance Use Topics  . Alcohol use: Never  . Drug use: Not Currently    Types: Marijuana    Home Medications Prior to Admission medications   Medication Sig Start Date End Date Taking? Authorizing Provider  albuterol (VENTOLIN HFA) 108 (90 Base) MCG/ACT inhaler Inhale 2 puffs into the lungs every 4 (four) hours as needed for wheezing or shortness of breath. 10/24/20   Lewie Chamber, MD  amLODipine (NORVASC) 10 MG tablet Take 1 tablet (10 mg total) by mouth daily. 10/06/20   Marcine Matar, MD  atorvastatin (LIPITOR) 20 MG tablet Take 1 tablet (20 mg total) by mouth daily. 10/06/20   Marcine Matar, MD  busPIRone (BUSPAR) 30 MG tablet Take 1 tablet (30 mg total) by mouth 2 (two) times daily. 10/29/20   Shanna Cisco, NP  diazepam (VALIUM) 5 MG tablet Take 1 tablet (5 mg total) by mouth daily. 10/29/20   Shanna Cisco, NP  DULoxetine (CYMBALTA) 60 MG capsule Take 1 capsule (60 mg total) by mouth 2 (two) times daily. 10/29/20   Shanna Cisco, NP  gabapentin (NEURONTIN) 300 MG capsule Take 600 mg by mouth 2 (two) times daily. 2 capsules in the morning 2 capsules in the evening 08/04/20   [provider]  HYDROmorphone (DILAUDID) 4 MG tablet Take 8 mg by mouth every 6 (six) hours as needed for severe pain. 09/22/20   [provider]  hydrOXYzine (ATARAX/VISTARIL) 50 MG tablet Take 1 tablet (50 mg total) by mouth 3 (three) times daily as needed for anxiety. 09/08/20   Shanna Cisco, NP  levothyroxine (SYNTHROID) 50 MCG tablet Take 1 tablet (50 mcg total) by mouth daily. 10/06/20   Marcine Matar, MD  lidocaine (LIDODERM) 5 % Place 1 patch onto the skin daily. Remove & Discard patch within 12 hours or as directed by MD 08/17/20   Caccavale, Sophia, PA-C  lisinopril (ZESTRIL) 20 MG tablet Take 1 tablet (20 mg total) by mouth daily. 10/06/20   Marcine Matar, MD  meloxicam (MOBIC) 15 MG tablet Take 1 tablet (15 mg total) by mouth daily. 07/19/20   Renne Crigler, PA-C  methocarbamol (ROBAXIN) 500 MG tablet Take 1 tablet (500 mg total) by mouth every 6 (six) hours as needed for muscle spasms. 09/30/20   Dione Booze, MD  promethazine (PHENERGAN) 12.5 MG tablet Take 12.5 mg by mouth every 6 (six) hours as needed for nausea or vomiting. 09/12/20   [provider]  SUMAtriptan (IMITREX) 100 MG tablet Take 100 mg by mouth daily as needed for migraine. May repeat in 2 hours if headache persists or recurs.    [provider]  ziprasidone (GEODON) 40 MG capsule Take 1 capsule (40 mg total) by mouth 2 (two) times daily with a meal. 10/29/20   Shanna Cisco, NP    Allergies    Morphine and related  Review of Systems   Review of Systems  Constitutional: Positive for diaphoresis. Negative for chills and fever.  HENT: Negative for ear pain and sore throat.   Eyes: Negative for pain and visual disturbance.  Respiratory: Positive for shortness of breath. Negative for cough.   Cardiovascular: Positive for chest pain. Negative for palpitations.  Gastrointestinal: Negative for abdominal pain and vomiting.  Genitourinary: Negative for dysuria and hematuria.  Musculoskeletal: Negative for arthralgias and back pain.  Skin: Negative for color change and rash.  Neurological: Negative for seizures and syncope.  Psychiatric/Behavioral: Positive for dysphoric mood and sleep disturbance. Negative for suicidal ideas. The patient is nervous/anxious.        Trouble falling asleep  All other systems reviewed and are negative.   Physical Exam Updated Vital Signs BP (!) 155/103 (BP  Location: Left Arm)   Pulse (!) 123   Temp 98.4 F (36.9 C) (Oral)   Resp 20   Ht 5\' 10"  (1.778 m)   Wt 83.5 kg   SpO2 98%   BMI 26.41 kg/m   Physical Exam Vitals and nursing note reviewed.  Constitutional:      Appearance: He is well-developed. He is diaphoretic.  HENT:     Head: Normocephalic and atraumatic.  Eyes:     Conjunctiva/sclera: Conjunctivae normal.  Cardiovascular:     Rate and Rhythm: Regular rhythm. Tachycardia present.     Heart sounds: No murmur heard.   Pulmonary:     Effort: Pulmonary effort is normal. No respiratory distress.     Breath sounds: Normal breath sounds.  Abdominal:     Palpations: Abdomen is soft.     Tenderness: There is no abdominal tenderness.  Musculoskeletal:  Cervical back: Neck supple.     Right lower leg: No edema.  Skin:    General: Skin is warm.  Neurological:     Mental Status: He is alert.     ED Results / Procedures / Treatments   Labs (all labs ordered are listed, but only abnormal results are displayed) Labs Reviewed  BASIC METABOLIC PANEL - Abnormal; Notable for the following components:      Result Value   Sodium 134 (*)    Glucose, Bld 234 (*)    Calcium 8.8 (*)    All other components within normal limits  CBC WITH DIFFERENTIAL/PLATELET - Abnormal; Notable for the following components:   RBC 3.68 (*)    Hemoglobin 11.3 (*)    HCT 32.9 (*)    All other components within normal limits  BRAIN NATRIURETIC PEPTIDE  D-DIMER, QUANTITATIVE  TROPONIN I (HIGH SENSITIVITY)  TROPONIN I (HIGH SENSITIVITY)    EKG EKG Interpretation  Date/Time:  Monday Nov 10 2020 18:17:42 EDT Ventricular Rate:  115 PR Interval:  182 QRS Duration: 126 QT Interval:  330 QTC Calculation: 456 R Axis:   7 Text Interpretation: Sinus tachycardia Non-specific intra-ventricular conduction block axis normal Minimal voltage criteria for LVH, may be normal variant ( Cornell product ) Cannot rule out Septal infarct , age undetermined  similar to prior Abnormal ECG Confirmed by Pieter Partridge ([id number]) on 11/10/2020 6:30:50 PM   Radiology DG Chest 2 View  Result Date: 11/10/2020 CLINICAL DATA:  Shortness of breath EXAM: CHEST - 2 VIEW COMPARISON:  10/20/2020 FINDINGS: Cardiac shadow is within normal limits. Patchy airspace opacity seen on the prior exam has resolved in the interval. No residual infiltrate or effusion is noted. No acute bony abnormality noted. IMPRESSION: No active cardiopulmonary disease. Electronically Signed   By: Alcide Clever M.D.   On: 11/10/2020 19:37    Procedures Procedures   Medications Ordered in ED Medications  LORazepam (ATIVAN) injection 1 mg (1 mg Intravenous Given 11/10/20 1933)  ketorolac (TORADOL) tablet 10 mg (10 mg Oral Given 11/10/20 2209)    ED Course  I have reviewed the triage vital signs and the nursing notes.  Pertinent labs & imaging results that were available during my care of the patient were reviewed by me and considered in my medical decision making (see chart for details).    MDM Rules/Calculators/A&P                          Matvey Llanas States presented complaining of symptoms of anxiety.  He described his anxiety as chest pain and dyspnea.  I noted that he was diaphoretic, and I wanted to make sure that he did not have a medical cause of his feelings of impending doom.  He was evaluated for evidence of ACS, PE, pneumonia.  ED work-up was within normal limits.  I have advised him to follow-up with his psychiatric provider.  He was also given information on the behavioral health urgent care.  Medication adjustments will not be given here.  No evidence of emergent psychological condition. Final Clinical Impression(s) / ED Diagnoses Final diagnoses:  Anxiety    Rx / DC Orders ED Discharge Orders    None       Koleen Distance, MD 11/10/20 2222

## 2020-11-10 NOTE — ED Notes (Signed)
Pt requesting pain medication; MD aware.

## 2020-11-10 NOTE — ED Triage Notes (Signed)
States he is having an anxiety attack. He got out of the hospital 4 days ago with pneumonia. He has a portable oxygen tank.

## 2020-11-10 NOTE — ED Notes (Signed)
Pt states he "does not feel good, anxiety is off the charts" States he was recently dcd with bilateral pneumonia and is still using oxygen at home.

## 2020-11-11 ENCOUNTER — Emergency Department (HOSPITAL_BASED_OUTPATIENT_CLINIC_OR_DEPARTMENT_OTHER)
Admission: EM | Admit: 2020-11-11 | Discharge: 2020-11-11 | Disposition: A | Discharge status: Home / Self Care | Payer: 59 | Attending: Emergency Medicine | Admitting: Emergency Medicine

## 2020-11-11 ENCOUNTER — Encounter (HOSPITAL_BASED_OUTPATIENT_CLINIC_OR_DEPARTMENT_OTHER): Payer: Self-pay | Admitting: Emergency Medicine

## 2020-11-11 DIAGNOSIS — Z79899 Other long term (current) drug therapy: Secondary | ICD-10-CM | POA: Insufficient documentation

## 2020-11-11 DIAGNOSIS — E039 Hypothyroidism, unspecified: Secondary | ICD-10-CM | POA: Insufficient documentation

## 2020-11-11 DIAGNOSIS — I10 Essential (primary) hypertension: Secondary | ICD-10-CM | POA: Diagnosis not present

## 2020-11-11 DIAGNOSIS — F419 Anxiety disorder, unspecified: Secondary | ICD-10-CM | POA: Diagnosis present

## 2020-11-11 DIAGNOSIS — F411 Generalized anxiety disorder: Secondary | ICD-10-CM

## 2020-11-11 MED ORDER — ONDANSETRON 4 MG PO TBDP
ORAL_TABLET | ORAL | Status: AC
  Filled 2020-11-11: qty 1

## 2020-11-11 MED ORDER — LORAZEPAM 1 MG PO TABS
2.0000 mg | ORAL_TABLET | Freq: Once | ORAL | Status: AC
  Administered 2020-11-11: 2 mg via ORAL
  Filled 2020-11-11: qty 2

## 2020-11-11 MED ORDER — ONDANSETRON 4 MG PO TBDP
4.0000 mg | ORAL_TABLET | Freq: Once | ORAL | Status: AC
  Administered 2020-11-11: 4 mg via ORAL

## 2020-11-11 NOTE — ED Triage Notes (Signed)
C/o anxiety and nausea. Pt seen here earlier for same.

## 2020-11-11 NOTE — ED Provider Notes (Signed)
[facility] EMERGENCY DEPARTMENT Provider Note   CSN: [id number] Arrival date & time: 11/11/20  0400     History Chief Complaint  Patient presents with  . Anxiety    Samuel Ewing is a 50 y.o. male.  HPI     This a 50 year old male with a history of generalized anxiety disorder, hypertension, opiate dependency presents with anxiety.  Patient reports that he has not slept in 2 days.  He reports increasing anxiety and hot and cold flashes which he attributes to his anxiety.  He also reports nausea.  He was seen and evaluated yesterday around 6 PM for the same.  At that time he had a full work-up including a cardiac work-up which was negative.  I have reviewed his chart.  He denies SI or HI.  He reports taking his medications as prescribed.  He denies any alcohol or drug use.  Past Medical History:  Diagnosis Date  . Anxiety   . Depression   . Hypertension   . Migraine   . Opiate dependence, continuous (HCC)   . Opiate withdrawal [facility])     Patient Active Problem List   Diagnosis Date Noted  . Physical deconditioning 10/22/2020  . Pneumonia of both lungs due to infectious organism 10/19/2020  . Sepsis with acute hypoxic respiratory failure (HCC) 10/19/2020  . Prediabetes 10/19/2020  . Pneumonia 10/19/2020  . Mild episode of recurrent major depressive disorder (HCC) 06/09/2020  . Essential hypertension 06/06/2020  . Hyperlipidemia 06/06/2020  . Migraine with aura and without status migrainosus, not intractable 06/06/2020  . Generalized anxiety disorder 06/06/2020  . Hypothyroidism 06/06/2020  . 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 Moderate episode of recurrent major depressive disorder (HCC) 06/06/2020    Past Surgical History:  Procedure Laterality Date  . BACK SURGERY    . CERVICAL SPINE SURGERY         Family History  Problem Relation Age of Onset  . Heart failure Mother   . Heart failure Father   . Heart attack Father     Social History    Tobacco Use  . Smoking status: Never Smoker  . Smokeless tobacco: Never Used  Vaping Use  . Vaping Use: Every day  . Substances: Nicotine  Substance Use Topics  . Alcohol use: Never  . Drug use: Yes    Types: Marijuana    Home Medications Prior to Admission medications   Medication Sig Start Date End Date Taking? Authorizing Provider  albuterol (VENTOLIN HFA) 108 (90 Base) MCG/ACT inhaler Inhale 2 puffs into the lungs every 4 (four) hours as needed for wheezing or shortness of breath. 10/24/20   Lewie Chamber, MD  amLODipine (NORVASC) 10 MG tablet Take 1 tablet (10 mg total) by mouth daily. 10/06/20   Marcine Matar, MD  atorvastatin (LIPITOR) 20 MG tablet Take 1 tablet (20 mg total) by mouth daily. 10/06/20   Marcine Matar, MD  busPIRone (BUSPAR) 30 MG tablet Take 1 tablet (30 mg total) by mouth 2 (two) times daily. 10/29/20   Shanna Cisco, NP  diazepam (VALIUM) 5 MG tablet Take 1 tablet (5 mg total) by mouth daily. 10/29/20   Shanna Cisco, NP  DULoxetine (CYMBALTA) 60 MG capsule Take 1 capsule (60 mg total) by mouth 2 (two) times daily. 10/29/20   Shanna Cisco, NP  gabapentin (NEURONTIN) 300 MG capsule Take 600 mg by mouth 2 (two) times daily. 2 capsules in the morning 2 capsules in the evening 08/04/20  [provider]  HYDROmorphone (DILAUDID) 4 MG tablet Take 8 mg by mouth every 6 (six) hours as needed for severe pain. 09/22/20   [provider]  hydrOXYzine (ATARAX/VISTARIL) 50 MG tablet Take 1 tablet (50 mg total) by mouth 3 (three) times daily as needed for anxiety. 09/08/20   Shanna Cisco, NP  levothyroxine (SYNTHROID) 50 MCG tablet Take 1 tablet (50 mcg total) by mouth daily. 10/06/20   Marcine Matar, MD  lidocaine (LIDODERM) 5 % Place 1 patch onto the skin daily. Remove & Discard patch within 12 hours or as directed by MD 08/17/20   Caccavale, Sophia, PA-C  lisinopril (ZESTRIL) 20 MG tablet Take 1 tablet (20 mg total) by mouth  daily. 10/06/20   Marcine Matar, MD  meloxicam (MOBIC) 15 MG tablet Take 1 tablet (15 mg total) by mouth daily. 07/19/20   Renne Crigler, PA-C  methocarbamol (ROBAXIN) 500 MG tablet Take 1 tablet (500 mg total) by mouth every 6 (six) hours as needed for muscle spasms. 09/30/20   Dione Booze, MD  promethazine (PHENERGAN) 12.5 MG tablet Take 12.5 mg by mouth every 6 (six) hours as needed for nausea or vomiting. 09/12/20   [provider]  SUMAtriptan (IMITREX) 100 MG tablet Take 100 mg by mouth daily as needed for migraine. May repeat in 2 hours if headache persists or recurs.    [provider]  ziprasidone (GEODON) 40 MG capsule Take 1 capsule (40 mg total) by mouth 2 (two) times daily with a meal. 10/29/20   Shanna Cisco, NP    Allergies    Morphine and related  Review of Systems   Review of Systems  Constitutional: Negative for fever.  Respiratory: Negative for shortness of breath.   Gastrointestinal: Positive for nausea. Negative for abdominal pain.  Psychiatric/Behavioral: Positive for sleep disturbance. Negative for suicidal ideas. The patient is nervous/anxious.   All other systems reviewed and are negative.   Physical Exam Updated Vital Signs BP (!) 142/90   Pulse 97   Temp 98 F (36.7 C) (Oral)   Resp 16   Ht 1.778 m (5\' 10" )   Wt 83.5 kg   SpO2 97%   BMI 26.41 kg/m   Physical Exam Vitals and nursing note reviewed.  Constitutional:      Appearance: He is well-developed. He is not ill-appearing.  HENT:     Head: Normocephalic and atraumatic.     Mouth/Throat:     Mouth: Mucous membranes are moist.  Eyes:     Pupils: Pupils are equal, round, and reactive to light.  Cardiovascular:     Rate and Rhythm: Normal rate and regular rhythm.     Heart sounds: Normal heart sounds. No murmur heard.   Pulmonary:     Effort: Pulmonary effort is normal. No respiratory distress.     Breath sounds: Normal breath sounds. No wheezing.  Abdominal:      Palpations: Abdomen is soft.     Tenderness: There is no abdominal tenderness.  Musculoskeletal:     Cervical back: Neck supple.     Right lower leg: No edema.     Left lower leg: No edema.  Skin:    General: Skin is warm and dry.  Neurological:     Mental Status: He is alert and oriented to person, place, and time.  Psychiatric:     Comments: Anxious appearing but cooperative     ED Results / Procedures / Treatments   Labs (all labs  ordered are listed, but only abnormal results are displayed) Labs Reviewed - No data to display  EKG None  Radiology DG Chest 2 View  Result Date: 11/10/2020 CLINICAL DATA:  Shortness of breath EXAM: CHEST - 2 VIEW COMPARISON:  10/20/2020 FINDINGS: Cardiac shadow is within normal limits. Patchy airspace opacity seen on the prior exam has resolved in the interval. No residual infiltrate or effusion is noted. No acute bony abnormality noted. IMPRESSION: No active cardiopulmonary disease. Electronically Signed   By: Alcide Clever M.D.   On: 11/10/2020 19:37    Procedures Procedures   Medications Ordered in ED Medications  LORazepam (ATIVAN) tablet 2 mg (has no administration in time range)    ED Course  I have reviewed the triage vital signs and the nursing notes.  Pertinent labs & imaging results that were available during my care of the patient were reviewed by me and considered in my medical decision making (see chart for details).    MDM Rules/Calculators/A&P                          Patient presents with increasing anxiety.  Was seen and evaluated yesterday had a full chest pain work-up to include negative troponins, reassuring EKG, reassuring imaging, and negative D-dimer.  Today he reports ongoing chills and nausea.  This is consistent with prior anxiety episodes.  He is seen fairly frequently based on chart review.  He reports that he has managed as an outpatient by psychiatrist.  I discussed with him that he may be better served at the  behavioral health urgent care in the future.  He has no SI or HI.  He had a full work-up earlier and was appropriately medically screened for comorbid medical condition.  Do not feel he needs further work-up.  I have offered him an Ativan.  He was given information regarding behavioral health urgent care for future episodes.  After history, exam, and medical workup I feel the patient has been appropriately medically screened and is safe for discharge home. Pertinent diagnoses were discussed with the patient. Patient was given return precautions.  Final Clinical Impression(s) / ED Diagnoses Final diagnoses:  Generalized anxiety disorder    Rx / DC Orders ED Discharge Orders    None       Bladyn Tipps, Mayer Masker, MD 11/11/20 [phone]

## 2020-11-12 ENCOUNTER — Ambulatory Visit: Payer: Self-pay | Admitting: *Deleted

## 2020-11-12 NOTE — Telephone Encounter (Signed)
Will forward to provider  

## 2020-11-12 NOTE — Telephone Encounter (Signed)
Pt reports tachycardia, presenting 6 months ago. Seen in ED yesterday(, Tuesday) Monday and 10/26/20 for "Panic attack." States HR "Always over 100." This AM 120, during call 106, pt checking with pulse ox. Denies dizziness, no palpitations, no SOB, "Just anxiety." States takes valium at HS as ordered did take one this AM. Denies CP. Pt has appt 12/08/20, requesting earlier appt. Assured pt NT would route to practice for PCPs review. Advised ED for worsening symptoms. Pt verbalizes understanding.  Please advise: [phone] Reason for Disposition . Problems with anxiety or stress  Answer Assessment - Initial Assessment Questions 1. DESCRIPTION: "Please describe your heart rate or heartbeat that you are having" (e.g., fast/slow, regular/irregular, skipped or extra beats, "palpitations")     Always >100 2. ONSET: "When did it start?" (Minutes, hours or days)      6 months ago 3. DURATION: "How long does it last" (e.g., seconds, minutes, hours)     VAries 4. PATTERN "Does it come and go, or has it been constant since it started?"  "Does it get worse with exertion?"   "Are you feeling it now?"     Can't tell 5. TAP: "Using your hand, can you tap out what you are feeling on a chair or table in front of you, so that I can hear?" (Note: not all patients can do this)       no 6. HEART RATE: "Can you tell me your heart rate?" "How many beats in 15 seconds?"  (Note: not all patients can do this)       120  Pulse OX check    106 during call 7. RECURRENT SYMPTOM: "Have you ever had this before?" If Yes, ask: "When was the last time?" and "What happened that time?"      Yes with anxiety 8. CAUSE: "What do you think is causing the palpitations?"     No palpitations 9. CARDIAC HISTORY: "Do you have any history of heart disease?" (e.g., heart attack, angina, bypass surgery, angioplasty, arrhythmia)      no 10. OTHER SYMPTOMS: "Do you have any other symptoms?" (e.g., dizziness, chest pain, sweating, difficulty  breathing)       Anxiety only  Protocols used: HEART RATE AND HEARTBEAT QUESTIONS-A-AH

## 2020-11-13 ENCOUNTER — Encounter (HOSPITAL_BASED_OUTPATIENT_CLINIC_OR_DEPARTMENT_OTHER): Payer: Self-pay | Admitting: Emergency Medicine

## 2020-11-13 ENCOUNTER — Other Ambulatory Visit: Payer: Self-pay

## 2020-11-13 ENCOUNTER — Ambulatory Visit (HOSPITAL_COMMUNITY)
Admission: EM | Admit: 2020-11-13 | Discharge: 2020-11-13 | Disposition: A | Discharge status: Home / Self Care | Payer: 59 | Attending: Nurse Practitioner | Admitting: Nurse Practitioner

## 2020-11-13 ENCOUNTER — Emergency Department (HOSPITAL_BASED_OUTPATIENT_CLINIC_OR_DEPARTMENT_OTHER)
Admission: EM | Admit: 2020-11-13 | Discharge: 2020-11-13 | Disposition: A | Discharge status: Home / Self Care | Payer: 59 | Attending: Emergency Medicine | Admitting: Emergency Medicine

## 2020-11-13 DIAGNOSIS — R0789 Other chest pain: Secondary | ICD-10-CM | POA: Insufficient documentation

## 2020-11-13 DIAGNOSIS — E039 Hypothyroidism, unspecified: Secondary | ICD-10-CM | POA: Diagnosis not present

## 2020-11-13 DIAGNOSIS — Z79899 Other long term (current) drug therapy: Secondary | ICD-10-CM | POA: Diagnosis not present

## 2020-11-13 DIAGNOSIS — F419 Anxiety disorder, unspecified: Secondary | ICD-10-CM | POA: Diagnosis present

## 2020-11-13 DIAGNOSIS — F411 Generalized anxiety disorder: Secondary | ICD-10-CM | POA: Diagnosis not present

## 2020-11-13 DIAGNOSIS — I10 Essential (primary) hypertension: Secondary | ICD-10-CM

## 2020-11-13 MED ORDER — ATORVASTATIN CALCIUM 20 MG PO TABS: 20.0000 mg | ORAL_TABLET | Freq: Every day | ORAL | 1 refills | Status: DC

## 2020-11-13 MED ORDER — AMLODIPINE BESYLATE 10 MG PO TABS: 10.0000 mg | ORAL_TABLET | Freq: Every day | ORAL | 1 refills | Status: DC

## 2020-11-13 MED ORDER — DIAZEPAM 5 MG PO TABS
5.0000 mg | ORAL_TABLET | Freq: Once | ORAL | Status: AC
  Administered 2020-11-13: 5 mg via ORAL
  Filled 2020-11-13: qty 1

## 2020-11-13 MED ORDER — LEVOTHYROXINE SODIUM 50 MCG PO TABS: 50.0000 ug | ORAL_TABLET | Freq: Every day | ORAL | 1 refills | Status: DC

## 2020-11-13 MED ORDER — LISINOPRIL 20 MG PO TABS
20.0000 mg | ORAL_TABLET | Freq: Every day | ORAL | 1 refills | Status: DC
Start: 2020-11-13 — End: 2021-04-23

## 2020-11-13 NOTE — ED Provider Notes (Signed)
[facility] EMERGENCY DEPT Provider Note   CSN: [id number] Arrival date & time: 11/13/20  3299     History Chief Complaint  Patient presents with  . Anxiety    Samuel Ewing is a 50 y.o. male.  HPI Patient reports that he cannot sleep.  He reports that he is very anxious.  He reports he has all the symptoms of panic and anxiety such as feeling like his heart is racing, chest tightness, sweating.  He reports that he has not been able to sleep for 2 days.  He reports he went to the behavioral health hospital and they gave him 10 mg of Valium but he still could not sleep.  He reports Ativan also is ineffective for him.  Patient denies any thoughts of hurting himself or killing himself.  Patient initially reported that he did not have prior history of anxiety or panic attacks until he moved to the [place] area from [place] just recently.  He did not recall which medications he had been treated with previously for some anxiety that he subsequently recalled was starting just before he left [place].  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  As I reviewed his chart through epic, we did review the fact that he has had a longstanding treatment for anxiety and psychiatric symptoms including regularly prescribed Geodon and BuSpar.  Patient also has had a number of hospitalizations and inpatient work-ups for chest pain including echo and stress test for chest pain.  These work-ups have been negative so far.  As the interview continued, patient then advised that he has had 30 pounds of weight loss recently and wondered if he was having a problem with his thyroid.  Patient has been prescribed Synthroid regular medication.     Past Medical History:  Diagnosis Date  . Anxiety   . Depression   . Hypertension   . Migraine   . Opiate dependence, continuous (HCC)   . Opiate withdrawal  [facility])     Patient Active Problem List   Diagnosis Date Noted  . Physical deconditioning 10/22/2020  . Pneumonia of both lungs due to infectious organism 10/19/2020  . Sepsis with acute hypoxic respiratory failure (HCC) 10/19/2020  . Prediabetes 10/19/2020  . Pneumonia 10/19/2020  . Mild episode of recurrent major depressive disorder (HCC) 06/09/2020  . Essential hypertension 06/06/2020  . Hyperlipidemia 06/06/2020  . Migraine with aura and without status migrainosus, not intractable 06/06/2020  . Generalized anxiety disorder 06/06/2020  . Hypothyroidism 06/06/2020  . 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 Moderate episode of recurrent major depressive disorder (HCC) 06/06/2020    Past Surgical History:  Procedure Laterality Date  . BACK SURGERY    . CERVICAL SPINE SURGERY         Family History  Problem Relation Age of Onset  . Heart failure Mother   . Heart failure Father   . Heart attack Father     Social History   Tobacco Use  . Smoking status: Never Smoker  . Smokeless tobacco: Never Used  Vaping Use  . Vaping Use: Every day  . Substances: Nicotine  Substance Use Topics  . Alcohol use: Never  . Drug use: Yes    Types: Marijuana    Home Medications Prior to Admission medications   Medication Sig Start Date End Date Taking? Authorizing Provider  albuterol (VENTOLIN HFA) 108 (90  Base) MCG/ACT inhaler Inhale 2 puffs into the lungs every 4 (four) hours as needed for wheezing or shortness of breath. 10/24/20  Yes Lewie Chamber, MD  amLODipine (NORVASC) 10 MG tablet Take 1 tablet (10 mg total) by mouth daily. 10/06/20  Yes Marcine Matar, MD  atorvastatin (LIPITOR) 20 MG tablet Take 1 tablet (20 mg total) by mouth daily. 10/06/20  Yes Marcine Matar, MD  busPIRone (BUSPAR) 30 MG tablet Take 1 tablet (30 mg total) by mouth 2 (two) times daily. 10/29/20  Yes Toy Cookey E, NP  diazepam (VALIUM) 5 MG tablet Take 1 tablet (5 mg total) by mouth daily. 10/29/20  Yes  Toy Cookey E, NP  DULoxetine (CYMBALTA) 60 MG capsule Take 1 capsule (60 mg total) by mouth 2 (two) times daily. 10/29/20  Yes Toy Cookey E, NP  gabapentin (NEURONTIN) 300 MG capsule Take 600 mg by mouth 2 (two) times daily. 2 capsules in the morning 2 capsules in the evening 08/04/20  Yes [provider]  HYDROmorphone (DILAUDID) 4 MG tablet Take 8 mg by mouth every 6 (six) hours as needed for severe pain. 09/22/20  Yes [provider]  levothyroxine (SYNTHROID) 50 MCG tablet Take 1 tablet (50 mcg total) by mouth daily. 10/06/20  Yes Marcine Matar, MD  lisinopril (ZESTRIL) 20 MG tablet Take 1 tablet (20 mg total) by mouth daily. 10/06/20  Yes Marcine Matar, MD  meloxicam (MOBIC) 15 MG tablet Take 1 tablet (15 mg total) by mouth daily. 07/19/20  Yes Renne Crigler, PA-C  methocarbamol (ROBAXIN) 500 MG tablet Take 1 tablet (500 mg total) by mouth every 6 (six) hours as needed for muscle spasms. 09/30/20  Yes Dione Booze, MD  SUMAtriptan (IMITREX) 100 MG tablet Take 100 mg by mouth daily as needed for migraine. May repeat in 2 hours if headache persists or recurs.   Yes [provider]  hydrOXYzine (ATARAX/VISTARIL) 50 MG tablet Take 1 tablet (50 mg total) by mouth 3 (three) times daily as needed for anxiety. 09/08/20   Toy Cookey E, NP  lidocaine (LIDODERM) 5 % Place 1 patch onto the skin daily. Remove & Discard patch within 12 hours or as directed by MD 08/17/20   Caccavale, Sophia, PA-C  promethazine (PHENERGAN) 12.5 MG tablet Take 12.5 mg by mouth every 6 (six) hours as needed for nausea or vomiting. 09/12/20   [provider]  ziprasidone (GEODON) 40 MG capsule Take 1 capsule (40 mg total) by mouth 2 (two) times daily with a meal. 10/29/20   Shanna Cisco, NP    Allergies    Morphine and related  Review of Systems   Review of Systems 10 systems reviewed and negative except as per HPI Physical Exam Updated Vital Signs BP (!) 138/92  (BP Location: Right Arm)   Pulse 96   Temp 98.1 F (36.7 C) (Oral)   Resp 16   Ht 5\' 10"  (1.778 m)   Wt 81.6 kg   SpO2 99%   BMI 25.83 kg/m   Physical Exam Constitutional:      Comments: Alert and nontoxic.  Mental status is clear.  No signs of active hallucinations or confusion.  No respiratory distress.  HENT:     Head: Normocephalic and atraumatic.     Nose: Nose normal.     Mouth/Throat:     Mouth: Mucous membranes are moist.     Pharynx: Oropharynx is clear.     Comments: Mucous membranes are pink moist and  clear without lesions Eyes:     Extraocular Movements: Extraocular movements intact.     Conjunctiva/sclera: Conjunctivae normal.     Pupils: Pupils are equal, round, and reactive to light.  Cardiovascular:     Rate and Rhythm: Normal rate and regular rhythm.     Comments: Heart rate is in the 90s.  No rub murmur gallop. Pulmonary:     Effort: Pulmonary effort is normal.     Breath sounds: Normal breath sounds.  Abdominal:     General: There is no distension.     Palpations: Abdomen is soft.     Tenderness: There is no abdominal tenderness. There is no guarding.  Musculoskeletal:        General: No swelling or tenderness. Normal range of motion.     Right lower leg: No edema.     Left lower leg: No edema.     Comments: No lower extremity edema.  Calves are soft and nontender  Neurological:     General: No focal deficit present.     Mental Status: He is oriented to person, place, and time.     Motor: No weakness.     Coordination: Coordination normal.  Psychiatric:     Comments: Patient is mildly anxious in appearance.  However, no signs of confusion, hallucination.  Speech is clear and goal-directed.  Not tangential in nature     ED Results / Procedures / Treatments   Labs (all labs ordered are listed, but only abnormal results are displayed) Labs Reviewed - No data to display  EKG None  Radiology No results found.  Procedures Procedures    Medications Ordered in ED Medications - No data to display  ED Course  I have reviewed the triage vital signs and the nursing notes.  Pertinent labs & imaging results that were available during my care of the patient were reviewed by me and considered in my medical decision making (see chart for details).    MDM Rules/Calculators/A&P                          Patient presents as outlined.  He has been seen recurrently in the emergency department and been seen at behavioral health.  At this time, no indication of an acute health emergency patient's vital signs are stable.  His physical exam is normal.  Patient reports he is unable to sleep despite high doses of chronically prescribed narcotics and benzodiazepines.  Patient had an unusual deficit in his recall regarding his past medical history of psychiatric therapy which is fairly longstanding.  After it was reviewed in epic, he did recall that he has had a longer standing history of anxiety and chronically treated for this condition with multiple medications.  At this time I have advised the patient that I do not feel that any additional medications from the emergency department will be helpful in managing his insomnia and anxiety.  Patient is already prescribed Dilaudid 4 mg for chronic pain control as well as Valium 5 mg.  He is also on multiple other medications including Geodon and Neurontin.  I reinforced to the patient that at this point, given the combination of medications already prescribed that I concern for high risk of overdose.  I have highly encouraged the patient to review his sleeplessness and anxiety with his main prescribers to address this.  Patient  denies any suicidal or homicidal feelings.  I have encouraged him to follow-up with behavioral health  and discuss intensive outpatient therapy. Final Clinical Impression(s) / ED Diagnoses Final diagnoses:  Anxiety    Rx / DC Orders ED Discharge Orders    None        Arby BarrettePfeiffer, Caprice Wasko, MD 11/13/20 [id number]

## 2020-11-13 NOTE — Progress Notes (Signed)
Information reiterated to patient that the one time dose of medication being provided this visit - will require that he not operate a motor vehicle for the next six to eight hours.  Pt agreed to utilize an [employer] to his residence near by and will collect his vehicle later this evening/night.    Pt placed a request for an [employer] in this writer's presence.

## 2020-11-13 NOTE — Discharge Instructions (Addendum)

## 2020-11-13 NOTE — Discharge Instructions (Signed)
1.  You have been treated with multiple medications for anxiety and depression.  You need intensive outpatient counseling programs.  Please follow-up with the behavioral health Hospital for choose a facility from the resource guide provided in your discharge instructions.

## 2020-11-13 NOTE — ED Triage Notes (Addendum)
Pt arrived via EMS from home related to Anxiety. Pt was seen at [facility] yesterday for same

## 2020-11-13 NOTE — ED Notes (Signed)
ED Provider at bedside. 

## 2020-11-13 NOTE — BH Assessment (Signed)
TRIAGE: ROUTINE  Samuel Ewing is a 50 year old patient who drove himself to the [facility]al [facility] Urgent [facility]) due to experiencing a panic attack. Pt states, "I'm having an anxiety attack. I do come here to see [place]. I'm on Cymbalta 60mg , Diazepam 5mg , and Busparone 15mg  2x/day. The Emergency Department just gives me Valium or Ativan and immediately discharges me. I just had double pneumonia. I've been not on my normal schedule because I was in the hospital for a week. I was just discharged on Tuesday."  Pt denies current SI or a plan to kill himself, though he acknowledges he has experiences SI in the past. Pt denies he's ever attempted to kill himself in the past, though he states he has been hospitalized in the past for mental health concerns, the last hospitalization of which was approx 5 years ago in . Pt denies HI, AVH, NSSIB, access to guns/weapons, or engagement with the legal system. He acknowledges he uses marijuana several times/week.

## 2020-11-13 NOTE — Telephone Encounter (Signed)
Contacted pt and made aware that pcp doesn't have any available appts but I can refer him to the mobile unit to be seen. Pt states his tachycardia is associated with his anxiety. Pt states he has an appt with psychiatrist tomorrow. Pt states he is needing refills on medications. I have refill meds

## 2020-11-13 NOTE — ED Provider Notes (Signed)
Behavioral Health Urgent Care Medical Screening Exam  Patient Name: Samuel Ewing MRN: [id number] Date of Evaluation: 11/13/20 Chief Complaint:   Diagnosis:  Final diagnoses:  GAD (generalized anxiety disorder)    History of Present illness: Samuel Ewing is a 50 y.o. male with a history of GAD who presents to [facility] due to a panic attack. On chart review, it is noted that he patient has presented to the ED six times since 10/05/20 with anxiety. Patient was evaluated at [facility] on 11/10/20 and 11/11/20 for anxiety. During his visit at [facility] on 11/10/20 he was evaluated for evidence of ACS, PE, pneumonia.  ED work-up was within normal limits. He was given ativan 1 mg and discharged home. On 11/11/20 he received ativan 2 mg and was discharged home. Patient was hospitalized 10/18/2020 for sepsis and pneumonia. He was discharged on home oxygen. Pulse ox currently 100% on 2L/[place].   On evaluation, patient is alert and oriented x 4. He is pleasant and cooperative. Speech is clear and coherent. He reports that the ativan that he has been receiving in the ED is not beneficial. He states that valium helps, but states that he is  prescribed 5 mg and that 10 mg works better.  Patient asked provider if a beta blocker may be beneficial for his on going anxiety. Discussed that beta blockers have effficacy for anxiety, but due to his recent health issues it would be best discussed with his PCP and psychiatric provider. Patient denies SI. Denies HI. Denies AVH. Does not appear to be responding to internal stimuli.   Psychiatric Specialty Exam  Presentation  General Appearance:Appropriate for Environment; Casual; Neat  Eye Contact:Good  Speech:Clear and Coherent; Normal Rate  Speech Volume:Normal  Handedness:No data recorded  Mood and Affect  Mood:Anxious  Affect:Congruent   Thought Process  Thought Processes:Coherent; Goal Directed; Linear  Descriptions of  Associations:Intact  Orientation:Full (Time, Place and Person)  Thought Content:Logical    Hallucinations:None  Ideas of Reference:None  Suicidal Thoughts:No  Homicidal Thoughts:No   Sensorium  Memory:Immediate Good; Recent Good; Remote Good  Judgment:Fair  Insight:Fair   Executive Functions  Concentration:Fair  Attention Span:Fair  Recall:Good  Fund of Knowledge:Good  Language:Good   Psychomotor Activity  Psychomotor Activity:Restlessness   Assets  Assets:Communication Skills; Desire for Improvement; Financial Resources/Insurance; Housing; Transportation   Sleep  Sleep:Poor  Number of hours: No data recorded  No data recorded  Physical Exam: Physical Exam Vitals and nursing note reviewed.  Constitutional:      General: He is not in acute distress.    Appearance: He is not ill-appearing, toxic-appearing or diaphoretic.  HENT:     Head: Normocephalic.     Right Ear: External ear normal.     Left Ear: External ear normal.  Cardiovascular:     Rate and Rhythm: Tachycardia present.  Pulmonary:     Effort: Pulmonary effort is normal. No respiratory distress.  Musculoskeletal:        General: Normal range of motion.  Neurological:     Mental Status: He is alert and oriented to person, place, and time.  Psychiatric:        Mood and Affect: Mood is anxious and depressed.        Speech: Speech normal.        Behavior: Behavior is cooperative.        Thought Content: Thought content is not paranoid or delusional. Thought content does not include homicidal or suicidal ideation. Thought content does  not include suicidal plan.    Review of Systems  Constitutional: Negative for chills, diaphoresis and fever.  HENT: Negative for congestion.   Respiratory: Negative for cough and shortness of breath (not currently).   Cardiovascular: Negative for chest pain and palpitations.  Gastrointestinal: Negative for diarrhea, nausea and vomiting.  Neurological:  Negative for dizziness and seizures.  Psychiatric/Behavioral: Positive for depression. Negative for hallucinations, memory loss, substance abuse and suicidal ideas. The patient has insomnia.   All other systems reviewed and are negative.  Blood pressure (!) 137/92, pulse (!) 103, temperature 98.2 F (36.8 C), temperature source Oral, resp. rate 18, SpO2 100 %. There is no height or weight on file to calculate BMI.  Musculoskeletal: Strength & Muscle Tone: within normal limits Gait & Station: normal Patient leans: N/A   [facility] MSE Discharge Disposition for Follow up and Recommendations: Based on my evaluation the patient does not appear to have an emergency medical condition and can be discharged with resources and follow up care in outpatient services for Medication Management and Individual Therapy   On review of PDMP, it is noted that patient filled a prescription for valium 5 mg #30 (30 day supply) on  10/29/2020. Informed that prescriptions for controlled substances are not prescribed in urgent care. Verbalizes understanding.   Patient instructed to schedule an appointment with Gretchen Short, NP or present during walk-in hours to discuss medication options.  Patient given valium 5 mg oral x 1 dose.  Patient contacted an uber to provide transportation home.   Nursing staff confirmed that patient was transported by uber.    Jackelyn Poling, NP 11/13/2020, 2:22 AM

## 2020-11-13 NOTE — ED Notes (Signed)
Pt called an [employer] to come and pick him up and transport him home. Pt was smiling and laughing no c/o of pain or distress. D/c instructions given and pt unnderstood

## 2020-11-14 ENCOUNTER — Telehealth (INDEPENDENT_AMBULATORY_CARE_PROVIDER_SITE_OTHER): Payer: 59 | Admitting: Psychiatry

## 2020-11-14 ENCOUNTER — Encounter (HOSPITAL_COMMUNITY): Payer: Self-pay | Admitting: Psychiatry

## 2020-11-14 DIAGNOSIS — F411 Generalized anxiety disorder: Secondary | ICD-10-CM

## 2020-11-14 DIAGNOSIS — F331 Major depressive disorder, recurrent, moderate: Secondary | ICD-10-CM | POA: Diagnosis not present

## 2020-11-14 DIAGNOSIS — J189 Pneumonia, unspecified organism: Secondary | ICD-10-CM

## 2020-11-14 HISTORY — DX: Pneumonia, unspecified organism: J18.9

## 2020-11-14 MED ORDER — ZIPRASIDONE HCL 40 MG PO CAPS: 40.0000 mg | ORAL_CAPSULE | Freq: Two times a day (BID) | ORAL | 2 refills | Status: DC

## 2020-11-14 MED ORDER — PROPRANOLOL HCL 10 MG PO TABS
10.0000 mg | ORAL_TABLET | Freq: Three times a day (TID) | ORAL | 2 refills | Status: DC
Start: 2020-11-14 — End: 2021-01-15

## 2020-11-14 MED ORDER — DIAZEPAM 5 MG PO TABS: 5.0000 mg | ORAL_TABLET | Freq: Every day | ORAL | 0 refills | Status: DC

## 2020-11-14 MED ORDER — DULOXETINE HCL 60 MG PO CPEP: 60.0000 mg | ORAL_CAPSULE | Freq: Two times a day (BID) | ORAL | 2 refills | Status: DC

## 2020-11-14 MED ORDER — BUSPIRONE HCL 30 MG PO TABS: 30.0000 mg | ORAL_TABLET | Freq: Two times a day (BID) | ORAL | 2 refills | Status: DC

## 2020-11-14 NOTE — Progress Notes (Signed)
BH MD/PA/NP OP Progress Note Virtual Visit via Telephone Note  I connected with Samuel Ewing on 11/14/20 at  9:30 AM EDT by telephone and verified that I am speaking with the correct person using two identifiers.  Location: Patient: home Provider: Clinic   I discussed the limitations, risks, security and privacy concerns of performing an evaluation and management service by telephone and the availability of in person appointments. I also discussed with the patient that there may be a patient responsible charge related to this service. The patient expressed understanding and agreed to proceed.   I provided 30 minutes of non-face-to-face time during this encounter.      11/14/2020 9:44 AM Samuel Ewing  MRN:  [id number]  Chief Complaint: "I have been absolutely horrible"  HPI: 50 year old male seen today for follow up psychiatric evaluation.  He has a psychiatric history of anxiety and depression. He is currently managed on BuSpar 30 mg twice daily, Cymbalta 60 mg twice daily, hydroxyzine 50 mg three times daily, Valium 5 mg once daily, and Geodon 40 twice daily (which he notes he takes for anxiety). Since his last visit he has been in the ED four times for anxiety and notes his medications are not effective in managing his psychiatric conditions.  Today patient was unable to login virtually so his exam was done over the phone. During assessment he was he is cooperative and engaged in conversation. He informed provider that he is doing horrible. He notes that he continues to go to the ED almost everyday due to severe anxiety. He notes that nothing is working and reports that when he is in the ED he is given a quick solution to a bigger problem. He notes that he does not go to the ED to get benzos but is prescribed then when he goes in. He informed provider that he feels impending doom every time he wakes up and throughout the day. He informed Clinical research associate that his heart beats hard and  fast in his chest and he fears he will die. Provider asked patient if he has cardiac issues and he notes that he was not sure. Patient BP was 138/92 and his pulse rate was 96 yesterday at the hospital. Provider encouraged patient to see his PCP and he notes that he has an appointment on June 13,2022.  Provider conducted a GAD 7 and patient scored a 21, at his last visit he scored a 21. He notes that his anxiety is interfering with is daily life. He notes that he is having issues going to work.  Patient notes that his physical health worsens his mental health. He informed provider that recently he has surgery on his neck and is in pain most days. He notes that Dilaudid is somewhat effective in managing his pain. He informed Clinical research associate that he took Gabapentin for pain but discontinued it a few days ago. Provider informed patient that gabapentin also helps with anxiety, he however notes it was ineffective and reports that he does not want to restart it. He also notes that he had pneumonia and is still managed on oxygen. Patient notes that the above stressors also makes him depressed. Today he scored an 18 on his PHQ 9 screening, at his last visit he scored a 21. He denies SI/HI/VAH, mania, or paranoia. He notes that his appetite has been poor and he has lost over 25 pounds. He also notes that his sleep has been poor noting that he sleeps 4 hours daily.  Provider  also informed patient that being on a benzodiazepine and an opioid at the same time is not recommended especially with his current health comorbidities. At this time Valium not increased. Patient was agreeable to start propranolol 10 mg three times daily to help manage anxiety. Potential side effects of medication and risks vs benefits of treatment vs non-treatment were explained and discussed. All questions were answered. At this time he notes that he would like to discontinue gabapentin and hydroxyzine because he finds them ineffective. He will continue all  other medications as prescribed.  No other concerns noted at this time.  Visit Diagnosis:    ICD-10-CM   1. Generalized anxiety disorder  F41.1 propranolol (INDERAL) 10 MG tablet    ziprasidone (GEODON) 40 MG capsule    DULoxetine (CYMBALTA) 60 MG capsule    diazepam (VALIUM) 5 MG tablet    busPIRone (BUSPAR) 30 MG tablet  2. Moderate episode of recurrent major depressive disorder (HCC)  F33.1 DULoxetine (CYMBALTA) 60 MG capsule    busPIRone (BUSPAR) 30 MG tablet    Past Psychiatric History: Anxiety and depression  Past Medical History:  Past Medical History:  Diagnosis Date  . Anxiety   . Depression   . Hypertension   . Migraine   . Opiate dependence, continuous (HCC)   . Opiate withdrawal [facility](HCC)     Past Surgical History:  Procedure Laterality Date  . BACK SURGERY    . CERVICAL SPINE SURGERY      Family Psychiatric History:  Paternal grandmother depression  Family History:  Family History  Problem Relation Age of Onset  . Heart failure Mother   . Heart failure Father   . Heart attack Father     Social History:  Social History   Socioeconomic History  . Marital status: Single    Spouse name: Not on file  . Number of children: Not on file  . Years of education: Not on file  . Highest education level: Not on file  Occupational History  . Not on file  Tobacco Use  . Smoking status: Never Smoker  . Smokeless tobacco: Never Used  Vaping Use  . Vaping Use: Every day  . Substances: Nicotine  Substance and Sexual Activity  . Alcohol use: Never  . Drug use: Yes    Types: Marijuana  . Sexual activity: Not on file  Other Topics Concern  . Not on file  Social History Narrative  . Not on file   Social Determinants of Health   Financial Resource Strain: Not on file  Food Insecurity: Not on file  Transportation Needs: Not on file  Physical Activity: Not on file  Stress: Not on file  Social Connections: Not on file    Allergies:  Allergies  Allergen  Reactions  . Morphine And Related     Metabolic Disorder Labs: Lab Results  Component Value Date   HGBA1C 6.4 (H) 10/19/2020   MPG 136.98 10/19/2020   No results found for: PROLACTIN Lab Results  Component Value Date   CHOL 120 06/18/2020   TRIG 101 06/18/2020   HDL 42 06/18/2020   CHOLHDL 2.9 06/18/2020   LDLCALC 59 06/18/2020   Lab Results  Component Value Date   TSH 0.431 10/20/2020   TSH 1.770 06/18/2020    Therapeutic Level Labs: No results found for: LITHIUM No results found for: VALPROATE No components found for:  CBMZ  Current Medications: Current Outpatient Medications  Medication Sig Dispense Refill  . propranolol (INDERAL) 10 MG tablet Take  1 tablet (10 mg total) by mouth 3 (three) times daily. 90 tablet 2  . albuterol (VENTOLIN HFA) 108 (90 Base) MCG/ACT inhaler Inhale 2 puffs into the lungs every 4 (four) hours as needed for wheezing or shortness of breath. 8 g 2  . amLODipine (NORVASC) 10 MG tablet Take 1 tablet (10 mg total) by mouth daily. 90 tablet 1  . atorvastatin (LIPITOR) 20 MG tablet Take 1 tablet (20 mg total) by mouth daily. 90 tablet 1  . busPIRone (BUSPAR) 30 MG tablet Take 1 tablet (30 mg total) by mouth 2 (two) times daily. 60 tablet 2  . diazepam (VALIUM) 5 MG tablet Take 1 tablet (5 mg total) by mouth daily. 30 tablet 0  . DULoxetine (CYMBALTA) 60 MG capsule Take 1 capsule (60 mg total) by mouth 2 (two) times daily. 60 capsule 2  . HYDROmorphone (DILAUDID) 4 MG tablet Take 8 mg by mouth every 6 (six) hours as needed for severe pain.    Marland Kitchen levothyroxine (SYNTHROID) 50 MCG tablet Take 1 tablet (50 mcg total) by mouth daily. 90 tablet 1  . lidocaine (LIDODERM) 5 % Place 1 patch onto the skin daily. Remove & Discard patch within 12 hours or as directed by MD 30 patch 0  . lisinopril (ZESTRIL) 20 MG tablet Take 1 tablet (20 mg total) by mouth daily. 90 tablet 1  . meloxicam (MOBIC) 15 MG tablet Take 1 tablet (15 mg total) by mouth daily. 10 tablet 0   . methocarbamol (ROBAXIN) 500 MG tablet Take 1 tablet (500 mg total) by mouth every 6 (six) hours as needed for muscle spasms. 40 tablet 0  . promethazine (PHENERGAN) 12.5 MG tablet Take 12.5 mg by mouth every 6 (six) hours as needed for nausea or vomiting.    . SUMAtriptan (IMITREX) 100 MG tablet Take 100 mg by mouth daily as needed for migraine. May repeat in 2 hours if headache persists or recurs.    . ziprasidone (GEODON) 40 MG capsule Take 1 capsule (40 mg total) by mouth 2 (two) times daily with a meal. 60 capsule 2   No current facility-administered medications for this visit.     Musculoskeletal: Strength & Muscle Tone: Unable to assess due to telephone visit Gait & Station: Unable to assess due to telephone visit Patient leans: N/A  Psychiatric Specialty Exam: Review of Systems  There were no vitals taken for this visit.There is no height or weight on file to calculate BMI.  General Appearance: Unable to assess due to telephone visit  Eye Contact:  Unable to assess due to telephone visit  Speech:  Clear and Coherent and Slow  Volume:  Normal  Mood:  Anxious and Depressed  Affect:  Appropriate and Congruent  Thought Process:  Coherent, Goal Directed and Linear  Orientation:  Full (Time, Place, and Person)  Thought Content: WDL and Logical   Suicidal Thoughts:  No  Homicidal Thoughts:  No  Memory:  Immediate;   Good Recent;   Good Remote;   Good  Judgement:  Good  Insight:  Good  Psychomotor Activity:  Normal  Concentration:  Concentration: Good and Attention Span: Good  Recall:  Good  Fund of Knowledge: Good  Language: Good  Akathisia:  No  Handed:  Right  AIMS (if indicated): Right  Assets:  Communication Skills Desire for Improvement Financial Resources/Insurance Housing Social Support  ADL's:  Intact  Cognition: WNL  Sleep:  Fair   Screenings: GAD-7   Flowsheet Row Video Visit from  11/14/2020 in [facility] Video Visit from  10/29/2020 in [facility] Video Visit from 09/08/2020 in [facility] Office Visit from 06/09/2020 in [facility] Telemedicine from 06/06/2020 in [facility]  Total GAD-7 Score 21 21 18 16 17     PHQ2-9   Flowsheet Row Video Visit from 11/14/2020 in [facility] Video Visit from 10/29/2020 in [facility] Office Visit from 09/10/2020 in [facility] Video Visit from 09/08/2020 in [facility] Office Visit from 06/09/2020 in [facility]  PHQ-2 Total Score 6 6 4 2 6   PHQ-9 Total Score 18 21 19 12 17     Flowsheet Row Video Visit from 11/14/2020 in [facility] ED from 11/13/2020 in [facility] ED from 11/11/2020 in [facility] EM[facility]ENC[facility] DEPARTMENT  C-SSRS RISK CATEGORY No Risk Error: Question 6 not populated No Risk       Assessment and Plan: Patient endorses symptoms of anxiety and depression. Today he is agreeable to start propanolol 10 mg three times daily to help manage anxiety. He notes that he finds hydroxyzine ineffective and reports he wants to discontinue it. He also informed [facility] that he was taking Gabapentin for pain but discontinued it a few days ago because it was ineffective. He will continue all other medications as prescribed.  1. Generalized anxiety disorder  Start- propranolol (INDERAL) 10 MG tablet; Take 1 tablet (10 mg total) by mouth 3 (three) times daily.  Dispense: 90 tablet; Refill: 2 Continue- ziprasidone (GEODON) 40 MG capsule; Take 1 capsule (40 mg total) by mouth 2 (two) times daily with a meal.  Dispense: 60 capsule; Refill: 2 - DULoxetine (CYMBALTA) 60 MG capsule; Take 1 capsule (60 mg total) by mouth 2 (two) times daily.  Dispense: 60 capsule; Refill: 2 Continue- diazepam  (VALIUM) 5 MG tablet; Take 1 tablet (5 mg total) by mouth daily.  Dispense: 30 tablet; Refill: 0 Continue- busPIRone (BUSPAR) 30 MG tablet; Take 1 tablet (30 mg total) by mouth 2 (two) times daily.  Dispense: 60 tablet; Refill: 2  2. Moderate episode of recurrent major depressive disorder (HCC)  Continue- DULoxetine (CYMBALTA) 60 MG capsule; Take 1 capsule (60 mg total) by mouth 2 (two) times daily.  Dispense: 60 capsule; Refill: 2 Continue- busPIRone (BUSPAR) 30 MG tablet; Take 1 tablet (30 mg total) by mouth 2 (two) times daily.  Dispense: 60 tablet; Refill: 2     Follow up in 2 months Follow-up with therapy  11/15/2020, NP 11/14/2020, 9:44 AM

## 2020-11-16 ENCOUNTER — Encounter: Payer: Self-pay | Admitting: Internal Medicine

## 2020-12-03 ENCOUNTER — Telehealth (HOSPITAL_COMMUNITY): Payer: Self-pay | Admitting: Psychiatry

## 2020-12-07 ENCOUNTER — Emergency Department (HOSPITAL_BASED_OUTPATIENT_CLINIC_OR_DEPARTMENT_OTHER)
Admission: EM | Admit: 2020-12-07 | Discharge: 2020-12-07 | Disposition: A | Discharge status: Home / Self Care | Payer: 59 | Attending: Emergency Medicine | Admitting: Emergency Medicine

## 2020-12-07 ENCOUNTER — Other Ambulatory Visit: Payer: Self-pay

## 2020-12-07 ENCOUNTER — Encounter (HOSPITAL_BASED_OUTPATIENT_CLINIC_OR_DEPARTMENT_OTHER): Payer: Self-pay | Admitting: *Deleted

## 2020-12-07 ENCOUNTER — Emergency Department (HOSPITAL_BASED_OUTPATIENT_CLINIC_OR_DEPARTMENT_OTHER): Payer: 59

## 2020-12-07 DIAGNOSIS — R059 Cough, unspecified: Secondary | ICD-10-CM | POA: Insufficient documentation

## 2020-12-07 DIAGNOSIS — R61 Generalized hyperhidrosis: Secondary | ICD-10-CM | POA: Diagnosis not present

## 2020-12-07 DIAGNOSIS — D649 Anemia, unspecified: Secondary | ICD-10-CM

## 2020-12-07 DIAGNOSIS — E039 Hypothyroidism, unspecified: Secondary | ICD-10-CM | POA: Insufficient documentation

## 2020-12-07 DIAGNOSIS — R0602 Shortness of breath: Secondary | ICD-10-CM

## 2020-12-07 DIAGNOSIS — Z79899 Other long term (current) drug therapy: Secondary | ICD-10-CM | POA: Insufficient documentation

## 2020-12-07 DIAGNOSIS — I10 Essential (primary) hypertension: Secondary | ICD-10-CM | POA: Diagnosis not present

## 2020-12-07 DIAGNOSIS — Z20822 Contact with and (suspected) exposure to covid-19: Secondary | ICD-10-CM | POA: Insufficient documentation

## 2020-12-07 LAB — RESP PANEL BY RT-PCR (FLU A&B, COVID) ARPGX2
Influenza A by PCR: NEGATIVE
Influenza B by PCR: NEGATIVE
SARS Coronavirus 2 by RT PCR: NEGATIVE

## 2020-12-07 LAB — CBC WITH DIFFERENTIAL/PLATELET
Abs Immature Granulocytes: 0.01 10*3/uL (ref 0.00–0.07)
Basophils Absolute: 0 10*3/uL (ref 0.0–0.1)
Basophils Relative: 1 %
Eosinophils Absolute: 0.1 10*3/uL (ref 0.0–0.5)
Eosinophils Relative: 2 %
HCT: 29.9 % — ABNORMAL LOW (ref 39.0–52.0)
Hemoglobin: 10.2 g/dL — ABNORMAL LOW (ref 13.0–17.0)
Immature Granulocytes: 0 %
Lymphocytes Relative: 21 %
Lymphs Abs: 1.3 10*3/uL (ref 0.7–4.0)
MCH: 31.5 pg (ref 26.0–34.0)
MCHC: 34.1 g/dL (ref 30.0–36.0)
MCV: 92.3 fL (ref 80.0–100.0)
Monocytes Absolute: 0.5 10*3/uL (ref 0.1–1.0)
Monocytes Relative: 8 %
Neutro Abs: 4.2 10*3/uL (ref 1.7–7.7)
Neutrophils Relative %: 68 %
Platelets: 178 10*3/uL (ref 150–400)
RBC: 3.24 MIL/uL — ABNORMAL LOW (ref 4.22–5.81)
RDW: 12.9 % (ref 11.5–15.5)
WBC: 6.1 10*3/uL (ref 4.0–10.5)
nRBC: 0 % (ref 0.0–0.2)

## 2020-12-07 LAB — BASIC METABOLIC PANEL
Anion gap: 8 (ref 5–15)
BUN: 15 mg/dL (ref 6–20)
CO2: 27 mmol/L (ref 22–32)
Calcium: 8.5 mg/dL — ABNORMAL LOW (ref 8.9–10.3)
Chloride: 100 mmol/L (ref 98–111)
Creatinine, Ser: 0.86 mg/dL (ref 0.61–1.24)
GFR, Estimated: >60 mL/min (ref >60)
Glucose, Bld: 193 mg/dL — ABNORMAL HIGH (ref 70–99)
Potassium: 4.2 mmol/L (ref 3.5–5.1)
Sodium: 135 mmol/L (ref 135–145)

## 2020-12-07 LAB — TROPONIN I (HIGH SENSITIVITY)
Troponin I (High Sensitivity): <2 ng/L (ref <18)
Troponin I (High Sensitivity): <2 ng/L (ref <18)

## 2020-12-07 LAB — D-DIMER, QUANTITATIVE: D-Dimer, Quant: 0.36 ug/mL-FEU (ref 0.00–0.50)

## 2020-12-07 IMAGING — DX DG CHEST 1V PORT
1 series · 1 of 1 positions shown · non-contrast
Comparison: [DATE]

CLINICAL DATA: Shortness of breath.  Recent pneumonia.

EXAM:
PORTABLE CHEST 1 VIEW

[chest]
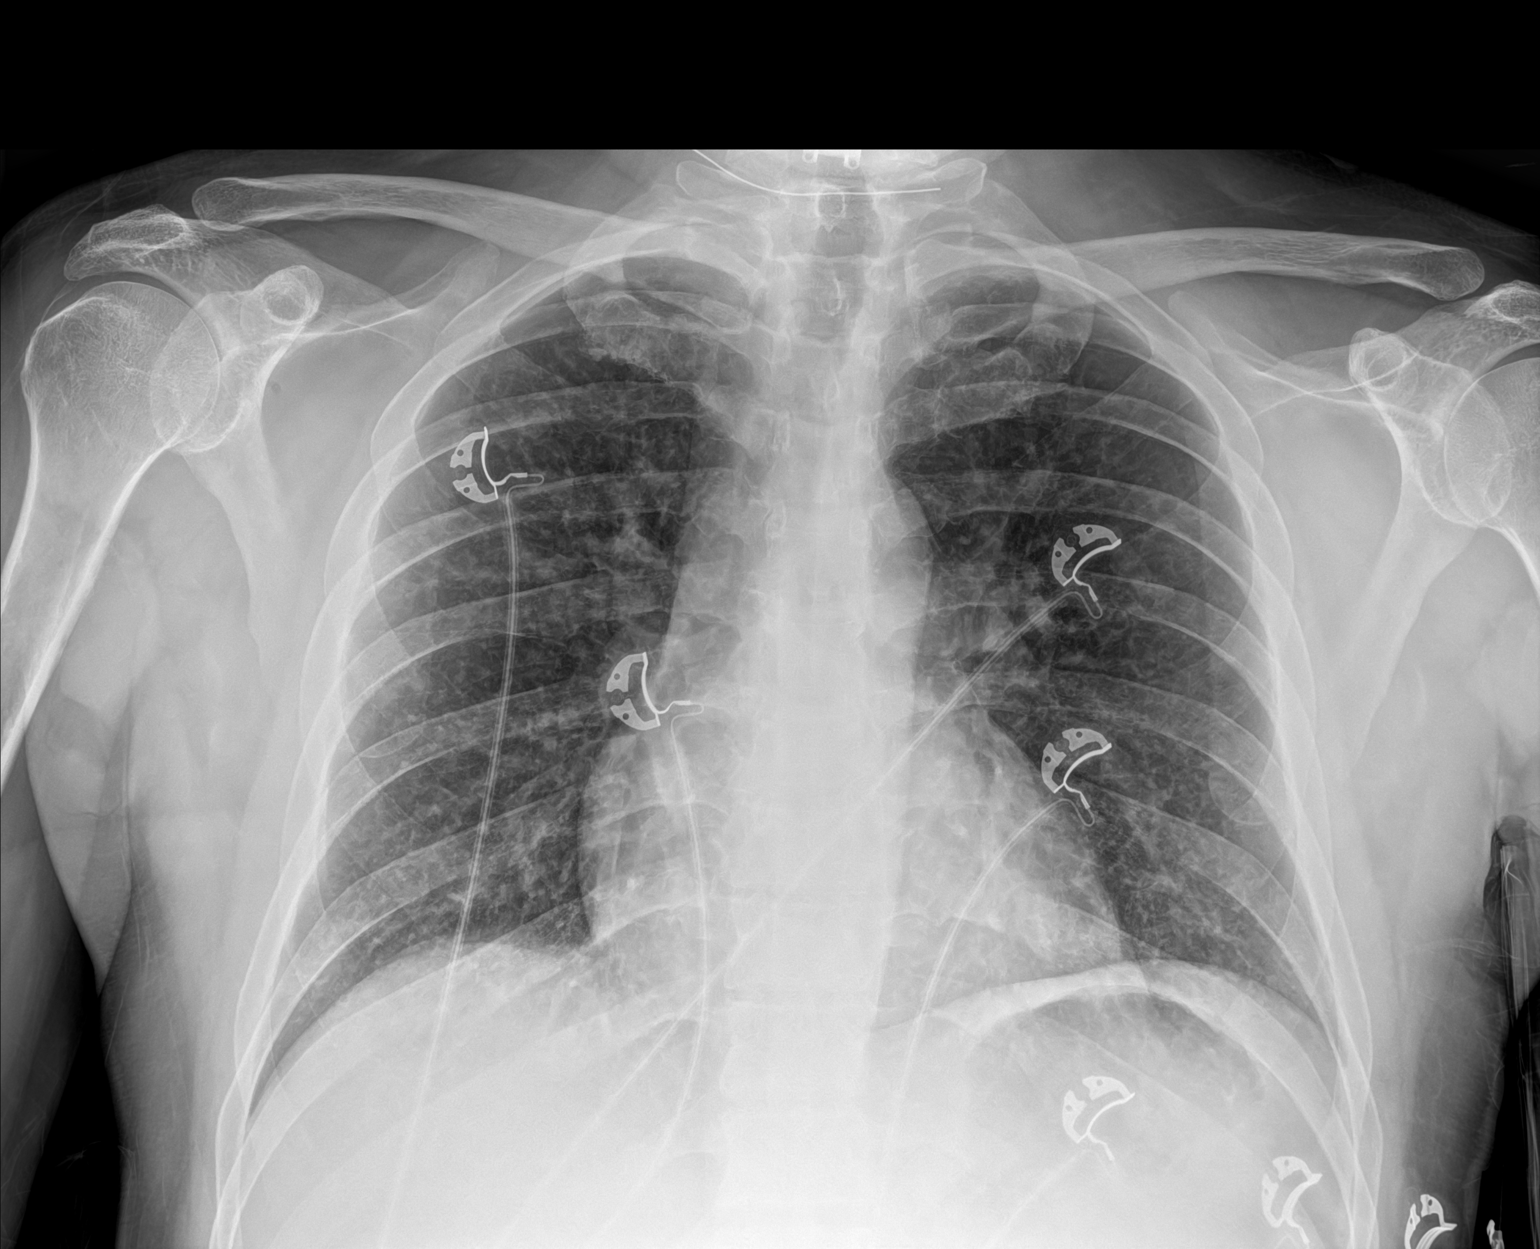

[1 of 1 positions shown; findings below may reference images not displayed]

FINDINGS: Numerous leads and wires project over the chest. Cervical spine
fixation. Midline trachea. Normal heart size and mediastinal
contours. No pleural effusion or pneumothorax. Mild nonspecific
pulmonary interstitial thickening is similar.
IMPRESSION: No lobar consolidation to suggest residual or recurrent pneumonia.

Mild pulmonary interstitial thickening which is nonspecific but
could be the sequelae of prior [CL] pneumonia. Chronic
bronchitis or asthma could look similar.

## 2020-12-07 NOTE — ED Triage Notes (Signed)
Patient stated that he just got over with pneumonia.  Shortness of breath started yesterday.

## 2020-12-07 NOTE — ED Provider Notes (Signed)
[facility] EMERGENCY DEPT Provider Note   CSN: [id number] Arrival date & time: 12/07/20  0755     History Chief Complaint  Patient presents with   Shortness of Breath    Samuel Ewing is a 50 y.o. male.  He is complaining of cough productive of clear sputum, shortness of breath, diaphoresis that started yesterday.  He was admitted in April for pneumonia and was discharged on oxygen.  He said his oxygen levels have been pretty good and he has not been needing oxygen.  Today he said his saturation was between 87 and 93%.  Non-smoker.  He is COVID vaccinated, not boosted  The history is provided by the patient.  Shortness of Breath Severity:  Moderate Onset quality:  Gradual Duration:  2 days Timing:  Constant Progression:  Unchanged Chronicity:  Recurrent Relieved by:  Nothing Worsened by:  Nothing Ineffective treatments:  None tried Associated symptoms: cough, diaphoresis and sputum production   Associated symptoms: no abdominal pain, no chest pain, no fever, no headaches, no hemoptysis, no neck pain, no rash, no sore throat, no vomiting and no wheezing   Risk factors: no tobacco use       Past Medical History:  Diagnosis Date   Anxiety    Depression    Hypertension    Migraine    Opiate dependence, continuous (HCC)    Opiate withdrawal (HCC)    Pneumonia 11/14/2020    Patient Active Problem List   Diagnosis Date Noted   Physical deconditioning 10/22/2020   Pneumonia of both lungs due to infectious organism 10/19/2020   Sepsis with acute hypoxic respiratory failure (HCC) 10/19/2020   Prediabetes 10/19/2020   Pneumonia 10/19/2020   Mild episode of recurrent major depressive disorder (HCC) 06/09/2020   Essential hypertension 06/06/2020   Hyperlipidemia 06/06/2020   Migraine with aura and without status migrainosus, not intractable 06/06/2020   Generalized anxiety disorder 06/06/2020   Hypothyroidism 06/06/2020   Low testosterone 06/06/2020    Moderate episode of recurrent major depressive disorder (HCC) 06/06/2020    Past Surgical History:  Procedure Laterality Date   BACK SURGERY     CERVICAL SPINE SURGERY         Family History  Problem Relation Age of Onset   Heart failure Mother    Heart failure Father    Heart attack Father     Social History   Tobacco Use   Smoking status: Never   Smokeless tobacco: Never  Vaping Use   Vaping Use: Every day   Substances: Nicotine  Substance Use Topics   Alcohol use: Never   Drug use: Yes    Types: Marijuana    Comment: 2 days ago-last smoked    Home Medications Prior to Admission medications   Medication Sig Start Date End Date Taking? Authorizing Provider  amLODipine (NORVASC) 10 MG tablet Take 1 tablet (10 mg total) by mouth daily. 11/13/20  Yes Marcine Matar, MD  atorvastatin (LIPITOR) 20 MG tablet Take 1 tablet (20 mg total) by mouth daily. 11/13/20  Yes Marcine Matar, MD  busPIRone (BUSPAR) 30 MG tablet Take 1 tablet (30 mg total) by mouth 2 (two) times daily. 11/14/20  Yes Toy Cookey E, NP  diazepam (VALIUM) 5 MG tablet Take 1 tablet (5 mg total) by mouth daily. 11/14/20  Yes Toy Cookey E, NP  DULoxetine (CYMBALTA) 60 MG capsule Take 1 capsule (60 mg total) by mouth 2 (two) times daily. 11/14/20  Yes Shanna Cisco, NP  HYDROmorphone (DILAUDID) 4 MG tablet Take 8 mg by mouth every 6 (six) hours as needed for severe pain. 09/22/20  Yes [provider]  levothyroxine (SYNTHROID) 50 MCG tablet Take 1 tablet (50 mcg total) by mouth daily. 11/13/20  Yes Marcine Matar, MD  lisinopril (ZESTRIL) 20 MG tablet Take 1 tablet (20 mg total) by mouth daily. 11/13/20  Yes Marcine Matar, MD  albuterol (VENTOLIN HFA) 108 (90 Base) MCG/ACT inhaler Inhale 2 puffs into the lungs every 4 (four) hours as needed for wheezing or shortness of breath. 10/24/20   Lewie Chamber, MD  lidocaine (LIDODERM) 5 % Place 1 patch onto the skin daily. Remove &  Discard patch within 12 hours or as directed by MD 08/17/20   Caccavale, Sophia, PA-C  promethazine (PHENERGAN) 12.5 MG tablet Take 12.5 mg by mouth every 6 (six) hours as needed for nausea or vomiting. 09/12/20   [provider]  propranolol (INDERAL) 10 MG tablet Take 1 tablet (10 mg total) by mouth 3 (three) times daily. 11/14/20   Shanna Cisco, NP  SUMAtriptan (IMITREX) 100 MG tablet Take 100 mg by mouth daily as needed for migraine. May repeat in 2 hours if headache persists or recurs.    [provider]  ziprasidone (GEODON) 40 MG capsule Take 1 capsule (40 mg total) by mouth 2 (two) times daily with a meal. 11/14/20   Shanna Cisco, NP    Allergies    Morphine and related  Review of Systems   Review of Systems  Constitutional:  Positive for diaphoresis. Negative for fever.  HENT:  Negative for sore throat.   Eyes:  Negative for visual disturbance.  Respiratory:  Positive for cough, sputum production and shortness of breath. Negative for hemoptysis and wheezing.   Cardiovascular:  Negative for chest pain.  Gastrointestinal:  Negative for abdominal pain and vomiting.  Genitourinary:  Negative for dysuria.  Musculoskeletal:  Negative for neck pain.  Skin:  Negative for rash.  Neurological:  Negative for headaches.   Physical Exam Updated Vital Signs BP 111/61 (BP Location: Left Arm)   Pulse 88   Temp 98.9 F (37.2 C) (Oral)   Resp 16   Ht 5\' 10"  (1.778 m)   Wt 81.6 kg   SpO2 93%   BMI 25.83 kg/m   Physical Exam Vitals and nursing note reviewed.  Constitutional:      Appearance: He is well-developed.  HENT:     Head: Normocephalic and atraumatic.  Eyes:     Conjunctiva/sclera: Conjunctivae normal.  Cardiovascular:     Rate and Rhythm: Normal rate and regular rhythm.     Heart sounds: No murmur heard. Pulmonary:     Effort: Pulmonary effort is normal. No respiratory distress.     Breath sounds: Normal breath sounds.  Abdominal:      Palpations: Abdomen is soft.     Tenderness: There is no abdominal tenderness.  Musculoskeletal:        General: Normal range of motion.     Cervical back: Neck supple.     Right lower leg: No tenderness. No edema.     Left lower leg: No tenderness. No edema.  Skin:    General: Skin is warm and dry.  Neurological:     General: No focal deficit present.     Mental Status: He is alert.    ED Results / Procedures / Treatments   Labs (all labs ordered are listed, but only abnormal results are displayed)  Labs Reviewed  BASIC METABOLIC PANEL - Abnormal; Notable for the following components:      Result Value   Glucose, Bld 193 (*)    Calcium 8.5 (*)    All other components within normal limits  CBC WITH DIFFERENTIAL/PLATELET - Abnormal; Notable for the following components:   RBC 3.24 (*)    Hemoglobin 10.2 (*)    HCT 29.9 (*)    All other components within normal limits  RESP PANEL BY RT-PCR (FLU A&B, COVID) [id number]  D-DIMER, QUANTITATIVE  TROPONIN I (HIGH SENSITIVITY)  TROPONIN I (HIGH SENSITIVITY)    EKG EKG Interpretation  Date/Time:  Sunday December 07 2020 08:25:03 EDT Ventricular Rate:  78 PR Interval:  193 QRS Duration: 132 QT Interval:  392 QTC Calculation: 447 R Axis:   81 Text Interpretation: Sinus rhythm Nonspecific intraventricular conduction delay No significant change since prior 5/22 Confirmed by Meridee Score [id number]) on 12/07/2020 8:27:00 AM  Radiology DG Chest Port 1 View  Result Date: 12/07/2020 CLINICAL DATA:  Shortness of breath.  Recent pneumonia. EXAM: PORTABLE CHEST 1 VIEW COMPARISON:  11/10/2020 FINDINGS: Numerous leads and wires project over the chest. Cervical spine fixation. Midline trachea. Normal heart size and mediastinal contours. No pleural effusion or pneumothorax. Mild nonspecific pulmonary interstitial thickening is similar. IMPRESSION: No lobar consolidation to suggest residual or recurrent pneumonia. Mild pulmonary interstitial thickening  which is nonspecific but could be the sequelae of prior COVID-19 pneumonia. Chronic bronchitis or asthma could look similar. Electronically Signed   By: Jeronimo Greaves M.D.   On: 12/07/2020 09:25    Procedures Procedures   Medications Ordered in ED Medications - No data to display  ED Course  I have reviewed the triage vital signs and the nursing notes.  Pertinent labs & imaging results that were available during my care of the patient were reviewed by me and considered in my medical decision making (see chart for details).  Clinical Course as of 12/07/20 1751  Sun Dec 07, 2020  2595 Chest x-ray interpreted by me as no acute infiltrates. [MB]    Clinical Course User Index [MB] Terrilee Files, MD   MDM Rules/Calculators/A&P                         Samuel Ewing was evaluated in Emergency Department on 12/07/2020 for the symptoms described in the history of present illness. He was evaluated in the context of the global COVID-19 pandemic, which necessitated consideration that the patient might be at risk for infection with the SARS-CoV-2 virus that causes COVID-19. Institutional protocols and algorithms that pertain to the evaluation of patients at risk for COVID-19 are in a state of rapid change based on information released by regulatory bodies including the CDC and federal and state organizations. These policies and algorithms were followed during the patient's care in the ED.  This patient complains of shortness of breath and diaphoresis; this involves an extensive number of treatment Options and is a complaint that carries with it a high risk of complications and Morbidity. The differential includes COVID, pneumonia, bronchitis, ACS, PE, pneumothorax, anxiety  I ordered, reviewed and interpreted labs, which included CBC with normal white count, hemoglobin low stable from priors, chemistries with elevated glucose, troponins flat, COVID and flu testing negative, D-dimer not  elevated I ordered imaging studies which included chest x-ray and I independently    visualized and interpreted imaging which showed no acute infiltrates Previous records obtained and reviewed  in epic, admitted April for community-acquired pneumonia  After the interventions stated above, I reevaluated the patient and found patient to be satting well on room air in no distress.  Reviewed work-up with him.  He is comfortable plan for outpatient follow-up with his primary care doctor.  Return instructions discussed   Final Clinical Impression(s) / ED Diagnoses Final diagnoses:  SOB (shortness of breath)  Anemia, unspecified type    Rx / DC Orders ED Discharge Orders     None        Terrilee FilesButler, Jakylah Bassinger C, MD 12/07/20 1754

## 2020-12-07 NOTE — Discharge Instructions (Addendum)
You were seen in the emergency department for shortness of breath.  You had blood work chest x-ray EKG and testing for COVID and flu.  There was no obvious explanation for your symptoms.  Please continue your regular medications and follow-up with your primary care doctor.  Return to the emergency department for any worsening or concerning symptoms.

## 2020-12-08 ENCOUNTER — Ambulatory Visit: Payer: 59 | Attending: Internal Medicine | Admitting: Internal Medicine

## 2020-12-08 VITALS — BP 100/64 | HR 76 | Resp 16 | Wt 183.4 lb

## 2020-12-08 DIAGNOSIS — R7303 Prediabetes: Secondary | ICD-10-CM

## 2020-12-08 DIAGNOSIS — Z8701 Personal history of pneumonia (recurrent): Secondary | ICD-10-CM

## 2020-12-08 DIAGNOSIS — R0602 Shortness of breath: Secondary | ICD-10-CM

## 2020-12-08 DIAGNOSIS — R0902 Hypoxemia: Secondary | ICD-10-CM | POA: Diagnosis not present

## 2020-12-08 DIAGNOSIS — D649 Anemia, unspecified: Secondary | ICD-10-CM

## 2020-12-08 MED ORDER — ACCU-CHEK GUIDE ME W/DEVICE KIT: PACK | 0 refills | Status: DC

## 2020-12-08 MED ORDER — ACCU-CHEK SOFTCLIX LANCETS MISC: 12 refills | Status: DC

## 2020-12-08 NOTE — Progress Notes (Signed)
Pt states he is only using oxygen at night

## 2020-12-08 NOTE — Patient Instructions (Signed)
Please check your blood sugars at least once a day in the mornings before breakfast.  If you are consistently running greater than 126, you do have diabetes.  Please send me the results of your blood sugar readings to the end of this week.  I have enclosed some information below about healthy eating habits.  Continue regular exercise.  I have referred you to the pulmonary specialist for further evaluation of your low oxygen level and shortness of breath.  We will also do a high-resolution CAT scan of your chest.

## 2020-12-08 NOTE — Progress Notes (Addendum)
Patient ID: Samuel Ewing, male    DOB: 03-14-1971  MRN: [id number]  CC: assess need for on-going oxygen use  Subjective: Samuel Ewing is a 50 y.o. male who presents for 4-6 wks f/u to assess need for continued oxygen use. His concerns today include:  Patient with history of hypertension, HL, migraines, anxiety disorder, hypothyroidism, family history of heart disease in both parents, parainfluenza pneumonia 09/2020, preDM (while on steroids for pneumonia), normocytic anemia  Patient was hospitalized the end of April with bilateral pneumonia due to parainfluenza virus.  He had to be discharged home on O2.  I did a transitional care visit with him via phone on 10/28/2020.   Since then, he was seen in the emergency room yesterday complaining of cough, shortness of breath and diuresis.  He was reporting home oxygen saturation of 87 to 93%.  He tells me today that he had a fever of 102 prior to going to the emergency room.  In the emergency room his O2 sat was 93% and at the vitals were stable.  Breath sounds were reported as normal.  Chest x-ray showed no lobar consolidation, mild pulmonary interstitial thickening which is nonspecific but could be the sequelae of prior COVID-19, chronic bronchitis or asthma. D-dimer was nl.  COVID test negative.  CBC revealed normal WBC and persistent mild to moderate anemia.  Today: Tells me that he uses O2 at nights; feels more SOB then Some SOB during the day but not bad.  Cough productive of clear mucus comes and goes.   Feels his breathing is better but not 100% back to baseline.  No cough when swallowing. Checks Pox  and gives range of  80-92% at nights and 94% during the day. He is on Dilaudid 8 mg every 6 hours prescribed by Dr. Nelva Bush at [facility].  Also on Valium 5 mg daily through his psychiatrist.  He confirms that he has a naloxone kit at home.  Anemia: will check iron studies No dizziness or blood in stools. Had c-scope in [place] for  screening last yr.  He reports it was nl.  On Dilaudid 8 mg Q 6 hrs.  Also on Valium 5 mg daily.  Has Naloxone kit at home.    On last visit, I note that he had an A1c of 6.4.  Increased blood sugars were thought to be due to prednisone which she was receiving in the hospital.  I note blood sugar on chemistry done yesterday through the ER was 193.  Patient Active Problem List   Diagnosis Date Noted   Physical deconditioning 10/22/2020   Pneumonia of both lungs due to infectious organism 10/19/2020   Sepsis with acute hypoxic respiratory failure ([place]) 10/19/2020   Prediabetes 10/19/2020   Pneumonia 10/19/2020   Mild episode of recurrent major depressive disorder ([place]) 06/09/2020   Essential hypertension 06/06/2020   Hyperlipidemia 06/06/2020   Migraine with aura and without status migrainosus, not intractable 06/06/2020   Generalized anxiety disorder 06/06/2020   Hypothyroidism 06/06/2020   Low testosterone 06/06/2020   Moderate episode of recurrent major depressive disorder ([place]) 06/06/2020     Current Outpatient Medications on File Prior to Visit  Medication Sig Dispense Refill   albuterol (VENTOLIN HFA) 108 (90 Base) MCG/ACT inhaler Inhale 2 puffs into the lungs every 4 (four) hours as needed for wheezing or shortness of breath. (Patient not taking: Reported on 12/08/2020) 8 g 2   amLODipine (NORVASC) 10 MG tablet Take 1 tablet (10 mg total)  by mouth daily. 90 tablet 1   atorvastatin (LIPITOR) 20 MG tablet Take 1 tablet (20 mg total) by mouth daily. 90 tablet 1   busPIRone (BUSPAR) 30 MG tablet Take 1 tablet (30 mg total) by mouth 2 (two) times daily. 60 tablet 2   diazepam (VALIUM) 5 MG tablet Take 1 tablet (5 mg total) by mouth daily. 30 tablet 0   DULoxetine (CYMBALTA) 60 MG capsule Take 1 capsule (60 mg total) by mouth 2 (two) times daily. 60 capsule 2   HYDROmorphone (DILAUDID) 4 MG tablet Take 8 mg by mouth every 6 (six) hours as needed for severe pain.     levothyroxine  (SYNTHROID) 50 MCG tablet Take 1 tablet (50 mcg total) by mouth daily. 90 tablet 1   lidocaine (LIDODERM) 5 % Place 1 patch onto the skin daily. Remove & Discard patch within 12 hours or as directed by MD 30 patch 0   lisinopril (ZESTRIL) 20 MG tablet Take 1 tablet (20 mg total) by mouth daily. 90 tablet 1   promethazine (PHENERGAN) 12.5 MG tablet Take 12.5 mg by mouth every 6 (six) hours as needed for nausea or vomiting.     propranolol (INDERAL) 10 MG tablet Take 1 tablet (10 mg total) by mouth 3 (three) times daily. 90 tablet 2   SUMAtriptan (IMITREX) 100 MG tablet Take 100 mg by mouth daily as needed for migraine. May repeat in 2 hours if headache persists or recurs.     ziprasidone (GEODON) 40 MG capsule Take 1 capsule (40 mg total) by mouth 2 (two) times daily with a meal. 60 capsule 2   No current facility-administered medications on file prior to visit.    Allergies  Allergen Reactions   Morphine And Related     Social History   Socioeconomic History   Marital status: Single    Spouse name: Not on file   Number of children: Not on file   Years of education: Not on file   Highest education level: Not on file  Occupational History   Not on file  Tobacco Use   Smoking status: Never   Smokeless tobacco: Never  Vaping Use   Vaping Use: Every day   Substances: Nicotine  Substance and Sexual Activity   Alcohol use: Never   Drug use: Yes    Types: Marijuana    Comment: 2 days ago-last smoked   Sexual activity: Not on file  Other Topics Concern   Not on file  Social History Narrative   Not on file   Social Determinants of Health   Financial Resource Strain: Not on file  Food Insecurity: Not on file  Transportation Needs: Not on file  Physical Activity: Not on file  Stress: Not on file  Social Connections: Not on file  Intimate Partner Violence: Not on file    Family History  Problem Relation Age of Onset   Heart failure Mother    Heart failure Father    Heart  attack Father     Past Surgical History:  Procedure Laterality Date   BACK SURGERY     CERVICAL SPINE SURGERY      ROS: Review of Systems Negative except as stated above  PHYSICAL EXAM: BP 100/64 (BP Location: Right Arm, Patient Position: Sitting, Cuff Size: Normal)   Pulse 76   Resp 16   Wt 183 lb 6.4 oz (83.2 kg)   SpO2 95%   BMI 26.32 kg/m   Physical Exam O2 reading is on RA  Pulse ox at rest on room air 92% Pulse ox room air on ambulation 90 to 93%  General appearance -middle-age Caucasian male in NAD.  When I walked into the room, patient was asleep in the chair.  He woke up to my voice.  He appeared drowsy and dozes off when not engage in active conversation.  Doses of analysis examining him. Mental status -answers questions appropriately.  However appears drowsy Neck - supple, no significant adenopathy Chest - clear to auscultation, no wheezes, rales or rhonchi, symmetric air entry Heart - normal rate, regular rhythm, normal S1, S2, no murmurs, rubs, clicks or gallops Extremities -no lower extremity edema  CMP Latest Ref Rng & Units 12/07/2020 11/10/2020 10/22/2020  Glucose 70 - 99 mg/dL 193(H) 234(H) 175(H)  BUN 6 - 20 mg/dL 15 19 20   Creatinine 0.61 - 1.24 mg/dL 0.86 0.80 0.73  Sodium 135 - 145 mmol/L 135 134(L) 140  Potassium 3.5 - 5.1 mmol/L 4.2 4.1 4.0  Chloride 98 - 111 mmol/L 100 99 104  CO2 22 - 32 mmol/L 27 25 28   Calcium 8.9 - 10.3 mg/dL 8.5(L) 8.8(L) 8.8(L)  Total Protein 6.5 - 8.1 g/dL - - -  Total Bilirubin 0.3 - 1.2 mg/dL - - -  Alkaline Phos 38 - 126 U/L - - -  AST 15 - 41 U/L - - -  ALT 0 - 44 U/L - - -   Lipid Panel     Component Value Date/Time   CHOL 120 06/18/2020 1003   TRIG 101 06/18/2020 1003   HDL 42 06/18/2020 1003   CHOLHDL 2.9 06/18/2020 1003   LDLCALC 59 06/18/2020 1003    CBC    Component Value Date/Time   WBC 6.1 12/07/2020 0829   RBC 3.24 (L) 12/07/2020 0829   HGB 10.2 (L) 12/07/2020 0829   HCT 29.9 (L) 12/07/2020 0829    PLT 178 12/07/2020 0829   MCV 92.3 12/07/2020 0829   MCH 31.5 12/07/2020 0829   MCHC 34.1 12/07/2020 0829   RDW 12.9 12/07/2020 0829   LYMPHSABS 1.3 12/07/2020 0829   MONOABS 0.5 12/07/2020 0829   EOSABS 0.1 12/07/2020 0829   BASOSABS 0.0 12/07/2020 0829    ASSESSMENT AND PLAN: 1. Hypoxia Patient with relative hypoxia on room air at rest and with exertion. Recent chest x-ray suggest interstitial changes.  I will refer him for high-resolution CT of the chest and refer to pulmonary. Other concern that may be contributing is moderate dose of  Dilaudid in combination with Valium.  I was particularly concerned given that he appeared drowsy today.  He tells me that he did not sleep well last night.  I told him that these medications in combination can cause respiratory suppression which can lead to accidental overdose.  I told him that he should speak with the pain specialist about weaning him down or off Dilaudid.  I told him that I will also try to reach Dr. Herma Mering. -We will have him keep the home O2 for now until his oxygen saturation is staying above the mid 90s.- Ambulatory referral to Pulmonology  2. Shortness of breath See plan above - CT Chest High Resolution; Future - Ambulatory referral to Pulmonology  3. History of viral pneumonia Rule out interstitial lung disease post viral pneumonia - Ambulatory referral to Pulmonology  4. Normocytic anemia - Iron, TIBC and Ferritin Panel  5. Prediabetes Advised patient that I will send prescription for diabetes testing supplies to his pharmacy so that he can check  blood sugars every morning before breakfast.  I asked that after doing so for 5 days, he sends me the results via MyChart and at that time we can make a decision about possibly starting him on metformin.  Discussed the importance of healthy eating habits and regular exercise. - Accu-Chek Softclix Lancets lancets; Use as instructed  Dispense: 100 each; Refill: 12 - Blood Glucose  Monitoring Suppl (ACCU-CHEK GUIDE ME) w/Device KIT; Check BS in Q a.m before breakfast  Dispense: 1 kit; Refill: 0   Patient was given the opportunity to ask questions.  Patient verbalized understanding of the plan and was able to repeat key elements of the plan.   Orders Placed This Encounter  Procedures   CT Chest High Resolution   Iron, TIBC and Ferritin Panel   Ambulatory referral to Pulmonology   ADDENDUM 12/10/2020: Phone call placed to Dr. Herma Mering today with [facility].  Informed him that I saw the patient a few days ago for f/u visit.  I was concern that pt may be over medicated given that he was drowsy and is on Dilaudid and Valium.  He still uses O2 at nights and has relative hypoxia.  In all fairness, pt did say that he did not sleep well the night before.  However, out of concern for pt's safety, I thought it best to bring this to his attention.  He told me that he is concern too that pt has been requiring too much narcotics post neck surgery and has him f/u once a mth.  He states he does plan to wean pt and that he has prescribed Narcan for him. He will discuss with pt on next visit this mth.   Requested Prescriptions   Signed Prescriptions Disp Refills   Accu-Chek Softclix Lancets lancets 100 each 12    Sig: Use as instructed   Blood Glucose Monitoring Suppl (ACCU-CHEK GUIDE ME) w/Device KIT 1 kit 0    Sig: Check BS in Q a.m before breakfast    Return in about 2 months (around 02/07/2021).  Karle Plumber, MD, FACP

## 2020-12-09 LAB — IRON,TIBC AND FERRITIN PANEL
Ferritin: 234 ng/mL (ref 30–400)
Iron Saturation: 9 % — CL (ref 15–55)
Iron: 25 ug/dL — ABNORMAL LOW (ref 38–169)
Total Iron Binding Capacity: 289 ug/dL (ref 250–450)
UIBC: 264 ug/dL (ref 111–343)

## 2020-12-11 ENCOUNTER — Encounter: Payer: Self-pay | Admitting: Pulmonary Disease

## 2020-12-12 ENCOUNTER — Ambulatory Visit (HOSPITAL_COMMUNITY): Payer: 59 | Attending: Internal Medicine

## 2020-12-18 ENCOUNTER — Ambulatory Visit (INDEPENDENT_AMBULATORY_CARE_PROVIDER_SITE_OTHER): Payer: 59 | Admitting: Clinical

## 2020-12-18 ENCOUNTER — Encounter: Payer: Self-pay | Admitting: Internal Medicine

## 2020-12-18 ENCOUNTER — Other Ambulatory Visit: Payer: Self-pay | Admitting: Internal Medicine

## 2020-12-18 DIAGNOSIS — F411 Generalized anxiety disorder: Secondary | ICD-10-CM | POA: Diagnosis not present

## 2020-12-18 DIAGNOSIS — E039 Hypothyroidism, unspecified: Secondary | ICD-10-CM

## 2020-12-18 NOTE — Progress Notes (Signed)
THERAPIST PROGRESS NOTE Virtual Visit via Video Note  I connected with Joshuah Minella Bartosik on 12/18/20 at  8:00 AM EDT by a video enabled telemedicine application and verified that I am speaking with the correct person using two identifiers.  Location: Patient: home Provider: office   I discussed the limitations of evaluation and management by telemedicine and the availability of in person appointments. The patient expressed understanding and agreed to proceed.   Follow Up Instructions: I discussed the assessment and treatment plan with the patient. The patient was provided an opportunity to ask questions and all were answered. The patient agreed with the plan and demonstrated an understanding of the instructions.   The patient was advised to call back or seek an in-person evaluation if the symptoms worsen or if the condition fails to improve as anticipated.   Session Time: 25 minutes  Participation Level: Active  Behavioral Response: CasualAlertEuthymic  Type of Therapy: Individual Therapy  Treatment Goals addressed: Anxiety  Interventions: CBT and Supportive  Summary:  JRUE JARRIEL is a 50 y.o. male who presents for the scheduled session oriented times five, appropriately dressed, and friendly. Client denied hallucinations and delusions.  Client presents for initial appointment with this therapist. Client presents for outpatient therapy referred by his current attending [facility] psychiatrist. Client presents following ED and urgent care visits in May 2022 related to anxiety and worsening panic attacks. Client reported he was was discharged from inpatient treatment this month of having double pneumonia. Client reported the psychiatrist has put him on a beta blocker which has significantly decreased his panic attacks. Client reported prior to the medication he was having panic attacks daily. Client reported now he has panic attacks 4 or 5 times per month. Client reported  history of difficulty managing his heart rate. Client reported a year ago he was hospitalized for a hypertension episode. Client reported extensive diagnostic testing was completed and the results came back normal. Client reported he believes there is an underlying medical reason that doctors are not finding. Client reported he is established with a primary care doctor and has follow up appointments with a pulmonary specialist. Client reported he has experienced discomfort while sleeping recovering from the pneumonia. Client reported he works for [employer] and they have been supportive and put him on paid leave vacation while he works on recovering to help reduce stress. Client reported his only stressors are currently going through a divorce but it has been amicable with his partner.  Client reported he endorses feeling anxious, on edge, and worry but notes improvement due to not having panic attacks daily. Client reported his appetite varies and his sleep has been broken due to difficulty breathing since having pneumonia.   GAD 7 : Generalized Anxiety Score 12/18/2020 12/08/2020 11/14/2020 10/29/2020  Nervous, Anxious, on Edge 3 3 3 3   Control/stop worrying 3 3 3 3   Worry too much - different things 3 3 3 3   Trouble relaxing 3 3 3 3   Restless 3 3 3 3   Easily annoyed or irritable 3 3 3 3   Afraid - awful might happen 3 3 3 3   Total GAD 7 Score 21 21 21 21   Anxiety Difficulty Very difficult - Very difficult Extremely difficult       Suicidal/Homicidal: Nowithout intent/plan  Therapist Response:  Therapist began the session making introductions and discussing confidentiality.  Therapist engaged with the client to discuss his urgent care and Er assessment related to his anxiety episodes. Therapist used CBT  to ask open end questions about ongoing symptoms of anxiety that persist. Therapist used CBT to ask about the clients medication compliance. Therapist used CBT and asked the client about his goal  for therapy.   Therapist completed the anxiety treatment plan and SDOH. Client was scheduled for next appointment.     Plan: Return again in 5 weeks.  Diagnosis: Generalized anxiety disorder     Neena Rhymes Shmiel Morton, LCSW 12/18/2020

## 2020-12-22 ENCOUNTER — Telehealth: Payer: Self-pay

## 2020-12-22 NOTE — Telephone Encounter (Signed)
Sent pt a MyChart message

## 2020-12-23 ENCOUNTER — Telehealth: Payer: Self-pay

## 2020-12-23 NOTE — Telephone Encounter (Signed)
Contacted pt to make sure he was able to get into his MyChart. Pt states he is. Made pt aware that I sent him a message providing him the number to call to reschedule his CT. Pt states he understands and he will go look into his messages and reschedule CT

## 2020-12-26 ENCOUNTER — Other Ambulatory Visit: Payer: Self-pay

## 2020-12-26 ENCOUNTER — Ambulatory Visit (HOSPITAL_COMMUNITY)
Admission: EM | Admit: 2020-12-26 | Discharge: 2020-12-26 | Disposition: A | Discharge status: Home / Self Care | Payer: 59 | Attending: Urology | Admitting: Urology

## 2020-12-26 DIAGNOSIS — F411 Generalized anxiety disorder: Secondary | ICD-10-CM

## 2020-12-26 MED ORDER — DIAZEPAM 5 MG PO TABS
5.0000 mg | ORAL_TABLET | Freq: Once | ORAL | Status: AC
  Administered 2020-12-26: 5 mg via ORAL
  Filled 2020-12-26: qty 1

## 2020-12-26 NOTE — Discharge Instructions (Addendum)
  Discharge recommendations:  Patient is to take all medications as prescribed. Please contact Sherril Cong, NP to discuss medication refill and/or management.  Please see information for follow-up appointment with psychiatry and therapy. Please follow up with your primary care provider for all medical related needs.   Therapy: We recommend that patient participate in individual therapy to address mental health concerns.  Medications: The parent/guardian is to contact a medical professional and/or outpatient provider to address any new side effects that develop. Parent/guardian should update outpatient providers of any new medications and/or medication changes.    Safety:  The patient should abstain from use of illicit substances/drugs and abuse of any medications. If symptoms worsen or do not continue to improve or if the patient becomes actively suicidal or homicidal then it is recommended that the patient return to the closest hospital emergency department, the [facility], or call 911 for further evaluation and treatment. National Suicide Prevention Lifeline 1-800-SUICIDE or 385-477-1177.

## 2020-12-26 NOTE — ED Provider Notes (Signed)
Behavioral Health Urgent Care Medical Screening Exam  Patient Name: Samuel Ewing MRN: [id number] Date of Evaluation: 12/27/20 Chief Complaint:   Diagnosis:  Final diagnoses:  GAD (generalized anxiety disorder)    History of Present illness: Samuel Ewing is a 50 y.o. male with psychiatric history of GAD and depression. Patient presented to [facility] voluntarily via uber with complaint of worsening anxiety and requesting refill for Valium 5mg . Patient report that he ran out of Valium 5mg  about 3 days ago. He reports that he is experiencing worsening anxiety due to not taking the above medication for the past 2 days. He notes that he had a panic attack earlier today. He denies SI, HI, and psychosis. He denies alcohol and substance abuse. He does not appear to be responding to any internal or external stimuli.   Patient is assessed by this NP. Patient is alert and oriented X4, anxious and cooperative. His speech is clear and coherent, his mood is anxious and affect is congruent with mood. He is noted to be diaphoretic, pacing, grinding his teeth, restless, and report that his heart is racing. He reports that his symptoms are due to anxiety/panic attack. His vital signs are staples BP 121/73, pulse 71, temp 97.8 F, RR 18, SpO2 96 %. He denies medical complaint including chest discomfort/pain, SOB, and acute distress. He was encouraged to take deep breathes. Patient reported slight improvement in anxiety symptom with deep breathing.   Psychiatric Specialty Exam  Presentation  General Appearance:Appropriate for Environment  Eye Contact:Good  Speech:Clear and Coherent  Speech Volume:Normal  Handedness:Right   Mood and Affect  Mood:Anxious  Affect:Congruent   Thought Process  Thought Processes:Coherent; Goal Directed  Descriptions of Associations:Intact  Orientation:Full (Time, Place and Person)  Thought Content:WDL    Hallucinations:None  Ideas of  Reference:None  Suicidal Thoughts:No  Homicidal Thoughts:No   Sensorium  Memory:Immediate Good; Recent Good; Remote Good  Judgment:Fair  Insight:Good   Executive Functions  Concentration:Fair  Attention Span:Fair  Recall:Good  Fund of Knowledge:Good  Language:Good   Psychomotor Activity  Psychomotor Activity:Restlessness   Assets  Assets:Desire for Improvement; ; Housing; Transportation; Financial Resources/Insurance   Sleep  Sleep:Poor  Number of hours: 4   No data recorded  Physical Exam: Physical Exam Vitals and nursing note reviewed.  Constitutional:      General: He is not in acute distress.    Appearance: He is well-developed. He is diaphoretic. He is not ill-appearing or toxic-appearing.  HENT:     Head: Normocephalic and atraumatic.  Eyes:     General:        Right eye: No discharge.        Left eye: No discharge.     Conjunctiva/sclera: Conjunctivae normal.  Cardiovascular:     Rate and Rhythm: Normal rate.  Pulmonary:     Effort: Pulmonary effort is normal. No respiratory distress.     Breath sounds: Normal breath sounds.  Chest:     Chest wall: No tenderness.  Abdominal:     Palpations: Abdomen is soft.     Tenderness: There is no abdominal tenderness.  Musculoskeletal:     Cervical back: Neck supple.  Skin:    General: Skin is warm.  Neurological:     Mental Status: He is alert and oriented to person, place, and time.  Psychiatric:        Attention and Perception: Attention and perception normal. He is attentive. He does not perceive auditory or visual hallucinations.  Mood and Affect: Mood is anxious.        Speech: Speech normal.        Behavior: Behavior is cooperative.        Thought Content: Thought content normal. Thought content is not paranoid or delusional. Thought content does not include homicidal or suicidal ideation. Thought content does not include homicidal or suicidal plan.         Cognition and Memory: Cognition normal.        Judgment: Judgment normal.   Review of Systems  Constitutional:  Positive for diaphoresis. Negative for chills and fever.  HENT: Negative.    Eyes: Negative.   Respiratory: Negative.    Cardiovascular: Negative.   Gastrointestinal: Negative.   Genitourinary: Negative.   Musculoskeletal: Negative.   Skin: Negative.   Neurological: Negative.   Endo/Heme/Allergies: Negative.   Psychiatric/Behavioral:  Negative for depression, hallucinations, substance abuse and suicidal ideas. The patient is nervous/anxious and has insomnia.   Blood pressure 121/73, pulse 71, temperature 97.8 F (36.6 C), temperature source Oral, resp. rate 18, SpO2 96 %. There is no height or weight on file to calculate BMI.  Musculoskeletal: Strength & Muscle Tone: within normal limits Gait & Station: normal Patient leans: Right   [facility] MSE Discharge Disposition for Follow up and Recommendations: Based on my evaluation the patient does not appear to have an emergency medical condition and can be discharged with resources and follow up care in outpatient services for Medication Management and Individual Therapy -Patient informed that controlled medication such as Valium can not be refilled at urgent care and that he should contact his psychiatric primary provider for refill and medication management.  -patient further instructed to notify primary provider for refill prior to running out of medication  -patient given one time dose of Valium 5mg  PO. -patient instructed to restart prior dose of hydroxyxine 50mg  TID PRN for anxiety till follow up with , NP  -patient contacted for transport home.    Sherril Cong, NP 12/27/2020, 1:54 AM

## 2020-12-26 NOTE — BH Assessment (Addendum)
Comprehensive Clinical Assessment (CCA) Screening, Triage and Referral Note  12/26/2020 Samuel Ewing [id number] Disposition: Pt was seen by this clinician and Samuel Asper, NP.  Pt had his MSE done by Samuel Ewing.  Pt does not meet criteia for staying at [facility].  Samuel Ewing talked to him about his medications and he was encouraged to contact Samuel Sarna, NP before he runs out of medications.     Chief Complaint:  Chief Complaint  Patient presents with   Panic Attack    Pt complains of a anxiety attack that has been going on for an hour.     Visit Diagnosis: Generalized Anxiety D/O  Patient Reported Information How did you hear about [place]? 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 Pt says he has been having an anxiety attack for the last hour.  He has a racing heartbeat, sweats, restlessness, teeth grinding, very anxious.  No precedent event.  Frequency used to be daily but now they are about 3x/W.  Pt says that the frequency went down some over the last month "when they put me on a beta blocker."  Pt says he has no SI, no plan, no intention.  Pt typically takes Valium for his anxiety.  He has a refil that is available on 07/02.  He sees Samuel Sarna, NP here at [facility], next appt is 07/20.  He did not take any Valium today because he ran out two days ago.  Pt has no HI or A/V hallucinations currently.  How Long Has This Been Causing You Problems? 1-6 months  What Do You Feel Would Help You the Most Today? Medication(s)   Have You Recently Had Any Thoughts About Hurting Yourself? No  Are You Planning to Commit Suicide/Harm Yourself At This time? No   Have you Recently Had Thoughts About Hurting Someone Samuel Ewing? No  Are You Planning to Harm Someone at This Time? No  Explanation: No data recorded  Have You Used Any Alcohol or Drugs in the Past 24 Hours? No  How Long Ago Did You Use Drugs or Alcohol? No data recorded What Did You Use and How Much? He  says he smokes marijuana "once in awhile."  Last use 2-4 days ago.   Do You Currently Have a Therapist/Psychiatrist? No data recorded Name of Therapist/Psychiatrist: No data recorded  Have You Been Recently Discharged From Any Office Practice or Programs? No data recorded Explanation of Discharge From Practice/Program: No data recorded   CCA Screening Triage Referral Assessment Type of Contact: No data recorded Telemedicine Service Delivery:   Is this Initial or Reassessment? No data recorded Date Telepsych consult ordered in [facility]:  No data recorded Time Telepsych consult ordered in [facility]:  No data recorded Location of Assessment: No data recorded Provider Location: No data recorded  Collateral Involvement: No data recorded  Does Patient Have a Court Appointed Legal Guardian? No data recorded Name and Contact of Legal Guardian: No data recorded If Minor and Not Living with Parent(s), Who has Custody? No data recorded Is CPS involved or ever been involved? No data recorded Is APS involved or ever been involved? No data recorded  Patient Determined To Be At Risk for Harm To Self or Others Based on Review of Patient Reported Information or Presenting Complaint? No data recorded Method: No data recorded Availability of Means: No data recorded Intent: No data recorded Notification Required: No data recorded Additional Information for Danger to Others Potential:  No data recorded Additional Comments for Danger to Others Potential: No data recorded Are There Guns or Other Weapons in Your Home? No data recorded Types of Guns/Weapons: No data recorded Are These Weapons Safely Secured?                            No data recorded Who Could Verify You Are Able To Have These Secured: No data recorded Do You Have any Outstanding Charges, Pending Court Dates, Parole/Probation? No data recorded Contacted To Inform of Risk of Harm To Self or Others: No data recorded  Does Patient Present under  Involuntary Commitment? No data recorded IVC Papers Initial File Date: No data recorded  [place] of Residence: No data recorded  Patient Currently Receiving the Following Services: No data recorded  Determination of Need: Routine (7 days)   Options For Referral: Other: Comment   Discharge Disposition:     [place], LCAS

## 2020-12-30 ENCOUNTER — Other Ambulatory Visit (HOSPITAL_COMMUNITY): Payer: Self-pay | Admitting: Psychiatry

## 2020-12-30 ENCOUNTER — Telehealth (HOSPITAL_COMMUNITY): Payer: Self-pay | Admitting: *Deleted

## 2020-12-30 DIAGNOSIS — F411 Generalized anxiety disorder: Secondary | ICD-10-CM

## 2020-12-30 MED ORDER — DIAZEPAM 5 MG PO TABS: 5.0000 mg | ORAL_TABLET | Freq: Every day | ORAL | 0 refills | Status: DC

## 2020-12-30 NOTE — Telephone Encounter (Signed)
Patient called and requested a refill of Valium.  His next appt is 01/14/21.

## 2020-12-30 NOTE — Telephone Encounter (Signed)
Medication refilled and sent to preferred pharmacy

## 2021-01-07 ENCOUNTER — Ambulatory Visit (INDEPENDENT_AMBULATORY_CARE_PROVIDER_SITE_OTHER): Payer: Self-pay | Admitting: Pulmonary Disease

## 2021-01-07 ENCOUNTER — Encounter: Payer: Self-pay | Admitting: Pulmonary Disease

## 2021-01-07 ENCOUNTER — Other Ambulatory Visit: Payer: Self-pay

## 2021-01-07 VITALS — BP 90/56 | HR 78 | Ht 70.0 in | Wt 178.0 lb

## 2021-01-07 DIAGNOSIS — Z8701 Personal history of pneumonia (recurrent): Secondary | ICD-10-CM

## 2021-01-07 DIAGNOSIS — R918 Other nonspecific abnormal finding of lung field: Secondary | ICD-10-CM

## 2021-01-07 DIAGNOSIS — Z9889 Other specified postprocedural states: Secondary | ICD-10-CM

## 2021-01-07 DIAGNOSIS — G4734 Idiopathic sleep related nonobstructive alveolar hypoventilation: Secondary | ICD-10-CM

## 2021-01-07 DIAGNOSIS — Z7289 Other problems related to lifestyle: Secondary | ICD-10-CM

## 2021-01-07 NOTE — Patient Instructions (Addendum)
Thank you for visiting Dr. Tonia Brooms at [facility]. Today we recommend the following:  Overnight oximetry order on room air  Order HST in Dr. Reginia Naas name and follow up with him after test.   Return in about 3 months (around 04/09/2021) for w/ Dr. Vassie Loll .    Please do your part to reduce the spread of COVID-19.

## 2021-01-07 NOTE — Progress Notes (Signed)
Synopsis: Referred in July 2022 for nocturnal hypoxemia by Marcine Matar, MD  Subjective:   PATIENT ID: Samuel Ewing GENDER: male DOB: 10/01/1970, MRN: [id number]  Chief Complaint  Patient presents with   Consult    Shortness of breath, hypoxia at night    PMH of depression, migraine, had pneumonia recently (April). Had cervical surgery and had aspiration afterwards.  From respiratory standpoint patient is doing well.  Recovered from pneumonia after hospitalization.  He has been monitoring his O2 sats at home.  He has a pulse oximeter.  He occasionally wakes up in the melanite and checks his pulse ox and finds it to be in the high 80s at approximately 87% or low 90s.  He had a sleep study many years ago.  He does have significant snoring.  He has an app at home that monitors his sleep and has marked him as being high risk for sleep apnea.   Past Medical History:  Diagnosis Date   Anxiety    Depression    Hypertension    Migraine    Opiate dependence, continuous (HCC)    Opiate withdrawal (HCC)    Pneumonia 11/14/2020     Family History  Problem Relation Age of Onset   Heart failure Mother    Heart failure Father    Heart attack Father      Past Surgical History:  Procedure Laterality Date   BACK SURGERY     CERVICAL SPINE SURGERY      Social History   Socioeconomic History   Marital status: Single    Spouse name: Not on file   Number of children: Not on file   Years of education: Not on file   Highest education level: Not on file  Occupational History   Not on file  Tobacco Use   Smoking status: Former    Pack years: 0.00    Types: Cigarettes, E-cigarettes    Start date: 1986   Smokeless tobacco: Never  Vaping Use   Vaping Use: Every day   Start date: 06/28/2017   Substances: Nicotine  Substance and Sexual Activity   Alcohol use: Never   Drug use: Yes    Types: Marijuana   Sexual activity: Not on file  Other Topics Concern   Not on file   Social History Narrative   Not on file   Social Determinants of Health   Financial Resource Strain: Not on file  Food Insecurity: Not on file  Transportation Needs: Not on file  Physical Activity: Not on file  Stress: Not on file  Social Connections: Not on file  Intimate Partner Violence: Not on file     Allergies  Allergen Reactions   Morphine And Related      Outpatient Medications Prior to Visit  Medication Sig Dispense Refill   Accu-Chek Softclix Lancets lancets Use as instructed 100 each 12   amLODipine (NORVASC) 10 MG tablet Take 1 tablet (10 mg total) by mouth daily. 90 tablet 1   atorvastatin (LIPITOR) 20 MG tablet Take 1 tablet (20 mg total) by mouth daily. 90 tablet 1   Blood Glucose Monitoring Suppl (ACCU-CHEK GUIDE ME) w/Device KIT Check BS in Q a.m before breakfast 1 kit 0   busPIRone (BUSPAR) 30 MG tablet Take 1 tablet (30 mg total) by mouth 2 (two) times daily. 60 tablet 2   diazepam (VALIUM) 5 MG tablet Take 1 tablet (5 mg total) by mouth daily. 30 tablet 0   DULoxetine (CYMBALTA) 60  MG capsule Take 1 capsule (60 mg total) by mouth 2 (two) times daily. 60 capsule 2   HYDROmorphone (DILAUDID) 4 MG tablet Take 8 mg by mouth every 6 (six) hours as needed for severe pain.     levothyroxine (SYNTHROID) 50 MCG tablet Take 1 tablet (50 mcg total) by mouth daily. 90 tablet 1   lidocaine (LIDODERM) 5 % Place 1 patch onto the skin daily. Remove & Discard patch within 12 hours or as directed by MD 30 patch 0   lisinopril (ZESTRIL) 20 MG tablet Take 1 tablet (20 mg total) by mouth daily. 90 tablet 1   promethazine (PHENERGAN) 12.5 MG tablet Take 12.5 mg by mouth every 6 (six) hours as needed for nausea or vomiting.     propranolol (INDERAL) 10 MG tablet Take 1 tablet (10 mg total) by mouth 3 (three) times daily. 90 tablet 2   SUMAtriptan (IMITREX) 100 MG tablet Take 100 mg by mouth daily as needed for migraine. May repeat in 2 hours if headache persists or recurs.      ziprasidone (GEODON) 40 MG capsule Take 1 capsule (40 mg total) by mouth 2 (two) times daily with a meal. 60 capsule 2   albuterol (VENTOLIN HFA) 108 (90 Base) MCG/ACT inhaler Inhale 2 puffs into the lungs every 4 (four) hours as needed for wheezing or shortness of breath. (Patient not taking: Reported on 01/07/2021) 8 g 2   No facility-administered medications prior to visit.    Review of Systems  Constitutional:  Negative for chills, fever, malaise/fatigue and weight loss.  HENT:  Positive for congestion. Negative for hearing loss, sore throat and tinnitus.   Eyes:  Negative for blurred vision and double vision.  Respiratory:  Positive for cough, sputum production and shortness of breath. Negative for hemoptysis, wheezing and stridor.   Cardiovascular:  Negative for chest pain, palpitations, orthopnea, leg swelling and PND.  Gastrointestinal:  Positive for heartburn. Negative for abdominal pain, constipation, diarrhea, nausea and vomiting.  Genitourinary:  Negative for dysuria, hematuria and urgency.  Musculoskeletal:  Positive for joint pain. Negative for myalgias.  Skin:  Negative for itching and rash.  Neurological:  Positive for headaches. Negative for dizziness, tingling and weakness.  Endo/Heme/Allergies:  Negative for environmental allergies. Does not bruise/bleed easily.  Psychiatric/Behavioral:  Positive for depression. The patient is nervous/anxious. The patient does not have insomnia.   All other systems reviewed and are negative.   Objective:  Physical Exam Vitals reviewed.  Constitutional:      General: He is not in acute distress.    Appearance: He is well-developed.  HENT:     Head: Normocephalic and atraumatic.  Eyes:     General: No scleral icterus.    Conjunctiva/sclera: Conjunctivae normal.     Pupils: Pupils are equal, round, and reactive to light.  Neck:     Vascular: No JVD.     Trachea: No tracheal deviation.  Cardiovascular:     Rate and Rhythm: Normal  rate and regular rhythm.     Heart sounds: Normal heart sounds. No murmur heard. Pulmonary:     Effort: Pulmonary effort is normal. No tachypnea, accessory muscle usage or respiratory distress.     Breath sounds: Normal breath sounds. No stridor. No wheezing, rhonchi or rales.  Abdominal:     General: Bowel sounds are normal. There is no distension.     Palpations: Abdomen is soft.     Tenderness: There is no abdominal tenderness.  Musculoskeletal:  General: No tenderness.     Cervical back: Neck supple.  Lymphadenopathy:     Cervical: No cervical adenopathy.  Skin:    General: Skin is warm and dry.     Capillary Refill: Capillary refill takes less than 2 seconds.     Findings: No rash.  Neurological:     Mental Status: He is alert and oriented to person, place, and time.  Psychiatric:        Behavior: Behavior normal.     Vitals:   01/07/21 1514  BP: (!) 90/56  Pulse: 78  SpO2: 96%  Weight: 178 lb (80.7 kg)  Height: $Remove'5\' 10"'StRiBnk$  (1.778 m)   96% on RA BMI Readings from Last 3 Encounters:  01/07/21 25.54 kg/m  12/08/20 26.32 kg/m  12/07/20 25.83 kg/m   Wt Readings from Last 3 Encounters:  01/07/21 178 lb (80.7 kg)  12/08/20 183 lb 6.4 oz (83.2 kg)  12/07/20 180 lb (81.6 kg)     CBC    Component Value Date/Time   WBC 6.1 12/07/2020 0829   RBC 3.24 (L) 12/07/2020 0829   HGB 10.2 (L) 12/07/2020 0829   HCT 29.9 (L) 12/07/2020 0829   PLT 178 12/07/2020 0829   MCV 92.3 12/07/2020 0829   MCH 31.5 12/07/2020 0829   MCHC 34.1 12/07/2020 0829   RDW 12.9 12/07/2020 0829   LYMPHSABS 1.3 12/07/2020 0829   MONOABS 0.5 12/07/2020 0829   EOSABS 0.1 12/07/2020 0829   BASOSABS 0.0 12/07/2020 0829    Chest Imaging: Chest x-ray: Evidence of basilar infiltrates. The patient's images have been independently reviewed by me.    Pulmonary Functions Testing Results: No flowsheet data found.  FeNO:   Pathology:   Echocardiogram:   Heart Catheterization:      Assessment & Plan:     ICD-10-CM   1. Nocturnal hypoxemia  G47.34 Home sleep test    Pulse oximetry, overnight    2. Bilateral pulmonary infiltrates on chest x-ray  R91.8     3. H/O cervical spine surgery  Z98.890     4. Current every day vaping  Z72.89     5. History of aspiration pneumonia  Z87.01       Discussion:  This is a 50 year old gentleman, seen today for concern of nocturnal hypoxemia.  He wakes up in the melanite checks his O2 sats usually are in the high 80s to low 90s.  He has a app that has been monitoring his sleep and is deemed high risk for potentially having sleep apnea.  He has not had a sleep study.  He had parainfluenza virus in April 2022.  Had a follow-up chest x-ray after hospitalization with bilateral infiltrates that were persistent.  He has HRCT scan that has been ordered by primary care already.  Plan: We will obtain a overnight pulse oximetry study to see if he does need oxygen at night. Of also ordered a home sleep study for him to have complete and follow-up with one of our sleep physicians, Dr. Elsworth Soho. I also explained that his ongoing vaping use can cause trouble in his lungs and cause diffuse injury.  I explained the CT scan that has been ordered from his primary care will also help assess this.  Follow-up with Dr. Elsworth Soho in sleep clinic after HST complete.    Current Outpatient Medications:    Accu-Chek Softclix Lancets lancets, Use as instructed, Disp: 100 each, Rfl: 12   amLODipine (NORVASC) 10 MG tablet, Take 1 tablet (10 mg total)  by mouth daily., Disp: 90 tablet, Rfl: 1   atorvastatin (LIPITOR) 20 MG tablet, Take 1 tablet (20 mg total) by mouth daily., Disp: 90 tablet, Rfl: 1   Blood Glucose Monitoring Suppl (ACCU-CHEK GUIDE ME) w/Device KIT, Check BS in Q a.m before breakfast, Disp: 1 kit, Rfl: 0   busPIRone (BUSPAR) 30 MG tablet, Take 1 tablet (30 mg total) by mouth 2 (two) times daily., Disp: 60 tablet, Rfl: 2   diazepam (VALIUM) 5 MG  tablet, Take 1 tablet (5 mg total) by mouth daily., Disp: 30 tablet, Rfl: 0   DULoxetine (CYMBALTA) 60 MG capsule, Take 1 capsule (60 mg total) by mouth 2 (two) times daily., Disp: 60 capsule, Rfl: 2   HYDROmorphone (DILAUDID) 4 MG tablet, Take 8 mg by mouth every 6 (six) hours as needed for severe pain., Disp: , Rfl:    levothyroxine (SYNTHROID) 50 MCG tablet, Take 1 tablet (50 mcg total) by mouth daily., Disp: 90 tablet, Rfl: 1   lidocaine (LIDODERM) 5 %, Place 1 patch onto the skin daily. Remove & Discard patch within 12 hours or as directed by MD, Disp: 30 patch, Rfl: 0   lisinopril (ZESTRIL) 20 MG tablet, Take 1 tablet (20 mg total) by mouth daily., Disp: 90 tablet, Rfl: 1   promethazine (PHENERGAN) 12.5 MG tablet, Take 12.5 mg by mouth every 6 (six) hours as needed for nausea or vomiting., Disp: , Rfl:    propranolol (INDERAL) 10 MG tablet, Take 1 tablet (10 mg total) by mouth 3 (three) times daily., Disp: 90 tablet, Rfl: 2   SUMAtriptan (IMITREX) 100 MG tablet, Take 100 mg by mouth daily as needed for migraine. May repeat in 2 hours if headache persists or recurs., Disp: , Rfl:    ziprasidone (GEODON) 40 MG capsule, Take 1 capsule (40 mg total) by mouth 2 (two) times daily with a meal., Disp: 60 capsule, Rfl: 2   albuterol (VENTOLIN HFA) 108 (90 Base) MCG/ACT inhaler, Inhale 2 puffs into the lungs every 4 (four) hours as needed for wheezing or shortness of breath. (Patient not taking: Reported on 01/07/2021), Disp: 8 g, Rfl: 2    Garner Nash, DO [facility] 01/07/2021 3:35 PM

## 2021-01-14 ENCOUNTER — Telehealth (HOSPITAL_COMMUNITY): Payer: 59 | Admitting: Psychiatry

## 2021-01-14 ENCOUNTER — Other Ambulatory Visit (HOSPITAL_COMMUNITY): Payer: Self-pay | Admitting: Psychiatry

## 2021-01-14 ENCOUNTER — Other Ambulatory Visit: Payer: Self-pay

## 2021-01-14 DIAGNOSIS — F411 Generalized anxiety disorder: Secondary | ICD-10-CM

## 2021-01-14 DIAGNOSIS — F331 Major depressive disorder, recurrent, moderate: Secondary | ICD-10-CM

## 2021-01-15 ENCOUNTER — Telehealth (HOSPITAL_COMMUNITY): Payer: Self-pay | Admitting: *Deleted

## 2021-01-15 ENCOUNTER — Telehealth (HOSPITAL_COMMUNITY): Payer: Self-pay | Admitting: Psychiatry

## 2021-01-15 ENCOUNTER — Other Ambulatory Visit (HOSPITAL_COMMUNITY): Payer: Self-pay | Admitting: Psychiatry

## 2021-01-15 DIAGNOSIS — F331 Major depressive disorder, recurrent, moderate: Secondary | ICD-10-CM

## 2021-01-15 DIAGNOSIS — F411 Generalized anxiety disorder: Secondary | ICD-10-CM

## 2021-01-15 MED ORDER — ZIPRASIDONE HCL 40 MG PO CAPS
40.0000 mg | ORAL_CAPSULE | Freq: Two times a day (BID) | ORAL | 3 refills | Status: DC
Start: 2021-01-15 — End: 2021-03-11

## 2021-01-15 MED ORDER — DULOXETINE HCL 60 MG PO CPEP: 60.0000 mg | ORAL_CAPSULE | Freq: Two times a day (BID) | ORAL | 32 refills | Status: DC

## 2021-01-15 MED ORDER — BUSPIRONE HCL 30 MG PO TABS: 30.0000 mg | ORAL_TABLET | Freq: Two times a day (BID) | ORAL | 3 refills | Status: DC

## 2021-01-15 MED ORDER — PROPRANOLOL HCL 10 MG PO TABS: 10.0000 mg | ORAL_TABLET | Freq: Three times a day (TID) | ORAL | 3 refills | Status: DC

## 2021-01-15 NOTE — Telephone Encounter (Signed)
Medications refilled and sent to preferred pharmacy.  Valium not increased or refilled at this time as it was just refilled on 12/30/2020.

## 2021-01-15 NOTE — Telephone Encounter (Signed)
Opened in error

## 2021-01-15 NOTE — Telephone Encounter (Signed)
Patient called scheduled appt 03/11/21 with Doyne Keel. Pt confirming medications sent to pharmacy to bridge until appt.

## 2021-01-26 ENCOUNTER — Other Ambulatory Visit (HOSPITAL_COMMUNITY): Payer: Self-pay | Admitting: Psychiatry

## 2021-01-26 DIAGNOSIS — F411 Generalized anxiety disorder: Secondary | ICD-10-CM

## 2021-01-27 ENCOUNTER — Telehealth (HOSPITAL_COMMUNITY): Payer: Self-pay | Admitting: *Deleted

## 2021-01-27 NOTE — Telephone Encounter (Signed)
Call from patient requesting a new rx for his Valium. Reviewed his chart and he should be out in the next day or two. Will inform his provider of his need for his next injection. His next appt is on 03/11/21.

## 2021-01-29 ENCOUNTER — Telehealth (HOSPITAL_COMMUNITY): Payer: Self-pay | Admitting: Psychiatry

## 2021-02-02 ENCOUNTER — Telehealth: Payer: Self-pay | Admitting: Pulmonary Disease

## 2021-02-02 ENCOUNTER — Other Ambulatory Visit: Payer: Self-pay

## 2021-02-02 ENCOUNTER — Ambulatory Visit: Payer: 59

## 2021-02-02 DIAGNOSIS — G4733 Obstructive sleep apnea (adult) (pediatric): Secondary | ICD-10-CM

## 2021-02-02 DIAGNOSIS — G4734 Idiopathic sleep related nonobstructive alveolar hypoventilation: Secondary | ICD-10-CM

## 2021-02-02 NOTE — Telephone Encounter (Signed)
ONO report received which was reviewed by Dr. Tonia Brooms. Study shows that pt spent less than or equal to 88% on room air for 3hr .  Per Dr. Tonia Brooms, pt needs to start on 2L O2 at night.  Called and spoke with pt letting him know the results of the ONO and he verbalized understanding. Pt stated that he already had O2 through [employer] so I made sure that he knew to wear it at 2L at night. Nothing further needed.

## 2021-02-04 ENCOUNTER — Other Ambulatory Visit: Payer: Self-pay | Admitting: Internal Medicine

## 2021-02-11 ENCOUNTER — Telehealth: Payer: Self-pay | Admitting: Pulmonary Disease

## 2021-02-11 DIAGNOSIS — G4733 Obstructive sleep apnea (adult) (pediatric): Secondary | ICD-10-CM

## 2021-02-11 NOTE — Telephone Encounter (Signed)
HST showed moderate  OSA with AHI 20/ hr Significant oxygen desaturations were noted even in the absence of apneas or hypopneas suggesting underlying cardiopulmonary disease  Suggest -formal CPAP titration followed by CPAP therapy -Does he need spirometry to evaluate for lung disease?

## 2021-02-11 NOTE — Telephone Encounter (Signed)
Leigh, please let kim know I have discussed sleep study results with RA.   Can you tell him that he has moderate OSA and that he needs a CPAP titration. Please setup an appt for him to see RA in clinic and discuss next steps   Thanks    I have called the pt and LM on VM.  Pt will need next consult OV with RA to discuss the sleep study results.

## 2021-02-11 NOTE — Telephone Encounter (Signed)
Called and spoke with patient. I was able to get him scheduled for 03/19/21 at 1030am with Dr. Vassie Loll. He verbalized understanding and is aware of our address.   Nothing further needed.

## 2021-02-18 ENCOUNTER — Other Ambulatory Visit: Payer: Self-pay

## 2021-02-18 ENCOUNTER — Encounter (HOSPITAL_BASED_OUTPATIENT_CLINIC_OR_DEPARTMENT_OTHER): Payer: Self-pay | Admitting: Emergency Medicine

## 2021-02-18 ENCOUNTER — Other Ambulatory Visit (HOSPITAL_BASED_OUTPATIENT_CLINIC_OR_DEPARTMENT_OTHER): Payer: Self-pay

## 2021-02-18 ENCOUNTER — Emergency Department (HOSPITAL_BASED_OUTPATIENT_CLINIC_OR_DEPARTMENT_OTHER): Payer: 59

## 2021-02-18 ENCOUNTER — Emergency Department (HOSPITAL_BASED_OUTPATIENT_CLINIC_OR_DEPARTMENT_OTHER)
Admission: EM | Admit: 2021-02-18 | Discharge: 2021-02-18 | Disposition: A | Discharge status: Home / Self Care | Payer: 59 | Attending: Emergency Medicine | Admitting: Emergency Medicine

## 2021-02-18 DIAGNOSIS — Z87891 Personal history of nicotine dependence: Secondary | ICD-10-CM | POA: Diagnosis not present

## 2021-02-18 DIAGNOSIS — R509 Fever, unspecified: Secondary | ICD-10-CM | POA: Diagnosis present

## 2021-02-18 DIAGNOSIS — Z79899 Other long term (current) drug therapy: Secondary | ICD-10-CM | POA: Insufficient documentation

## 2021-02-18 DIAGNOSIS — I10 Essential (primary) hypertension: Secondary | ICD-10-CM | POA: Diagnosis not present

## 2021-02-18 DIAGNOSIS — E039 Hypothyroidism, unspecified: Secondary | ICD-10-CM | POA: Diagnosis not present

## 2021-02-18 DIAGNOSIS — U071 COVID-19: Secondary | ICD-10-CM | POA: Diagnosis not present

## 2021-02-18 LAB — RESP PANEL BY RT-PCR (FLU A&B, COVID) ARPGX2
Influenza A by PCR: NEGATIVE
Influenza B by PCR: NEGATIVE
SARS Coronavirus 2 by RT PCR: POSITIVE — AB

## 2021-02-18 IMAGING — DX DG CHEST 1V PORT
1 series · 1 of 1 positions shown · non-contrast
Comparison: Chest radiograph [DATE]

CLINICAL DATA: Cough, shortness of breath, recently exposed to
COVID.

EXAM:
PORTABLE CHEST 1 VIEW

[chest]
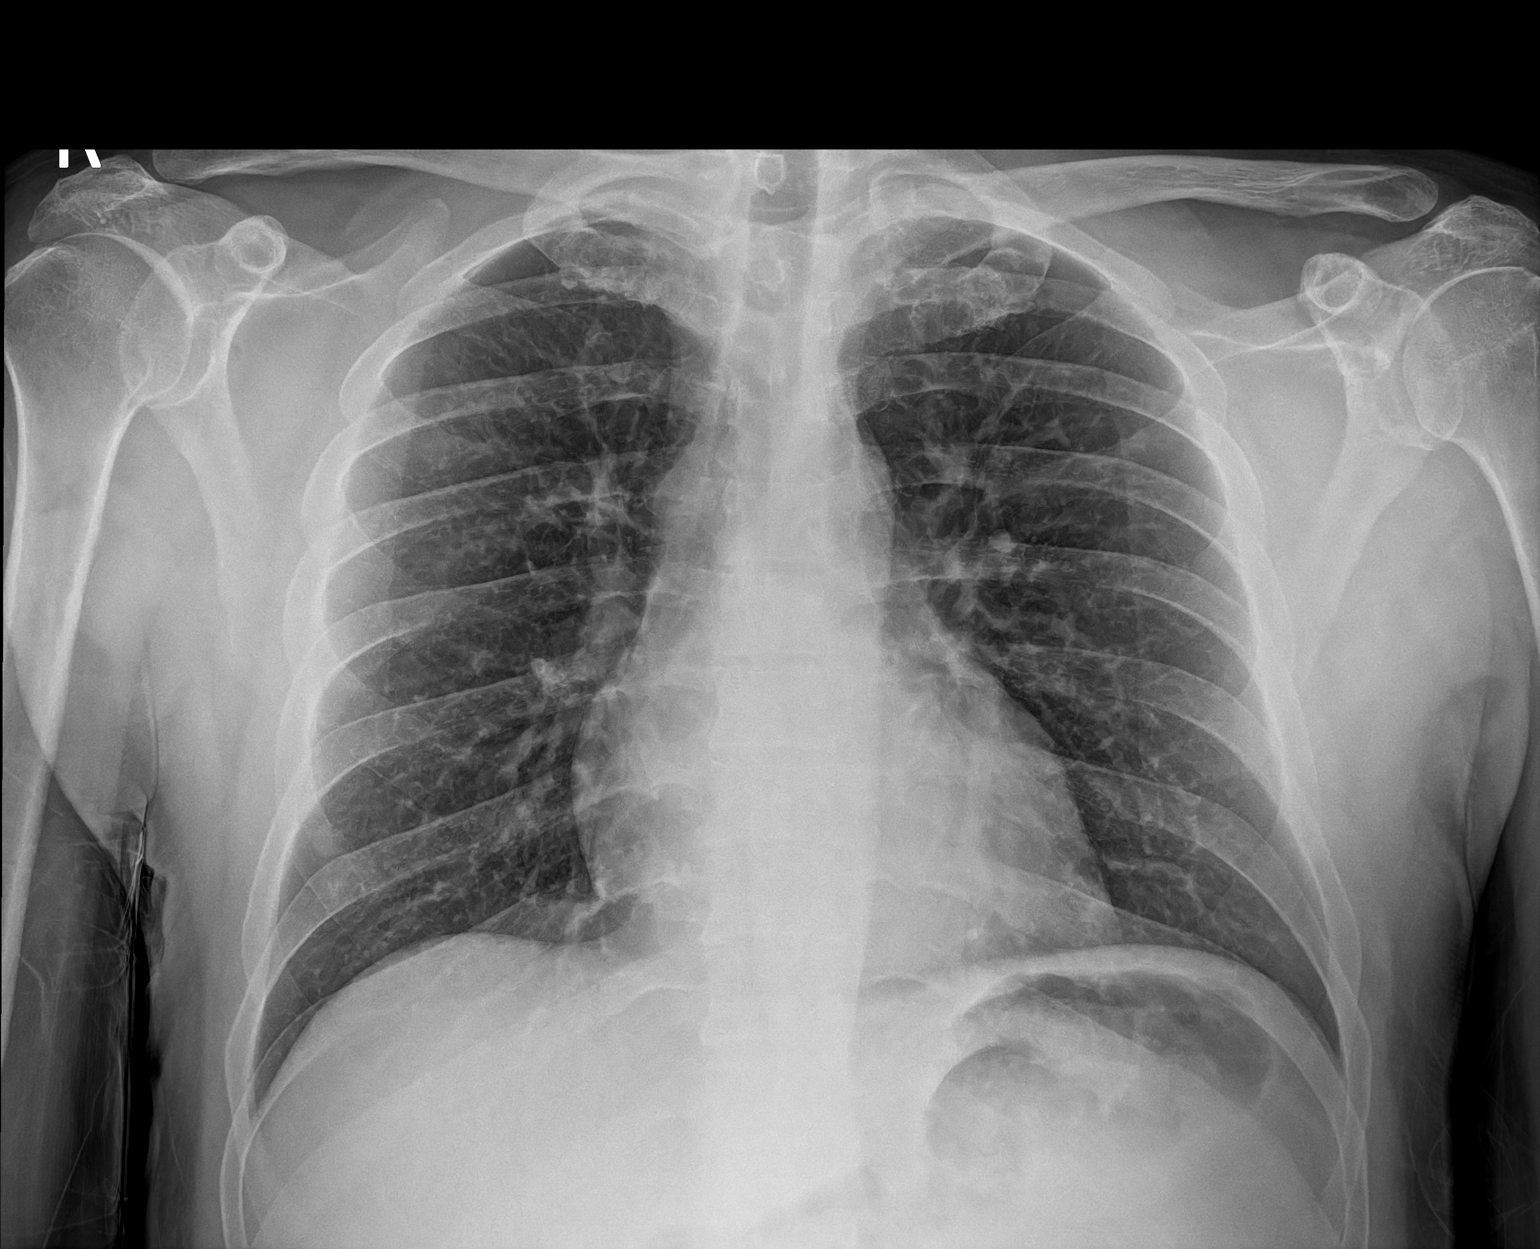

[1 of 1 positions shown; findings below may reference images not displayed]

FINDINGS: The heart size and mediastinal contours are within normal limits. No
focal consolidation, pleural effusion, or pneumothorax. A 0.8 cm
round nodular opacity is noted in the lateral right lower lobe,
projecting at the level of the right anterior sixth rib. Mild
pulmonary interstitial thickening, similar to [DATE], likely
sequela prior [DG] pneumonia. Partially visualized cervical
hardware.
IMPRESSION: 1. No active cardiopulmonary disease.
2. A 0.8 cm round nodular opacity projecting over the lateral right
lower lobe could represent nipple shadow versus pulmonary nodule.
Recommend repeat imaging with nipple markers.

## 2021-02-18 MED ORDER — NIRMATRELVIR/RITONAVIR (PAXLOVID)TABLET
3.0000 | ORAL_TABLET | Freq: Two times a day (BID) | ORAL | 0 refills | Status: DC
  Filled 2021-02-18: qty 30, 5d supply, fill #0

## 2021-02-18 MED ORDER — IBUPROFEN 800 MG PO TABS
800.0000 mg | ORAL_TABLET | Freq: Once | ORAL | Status: AC
  Administered 2021-02-18: 800 mg via ORAL
  Filled 2021-02-18: qty 1

## 2021-02-18 MED ORDER — MOLNUPIRAVIR EUA 200MG CAPSULE
4.0000 | ORAL_CAPSULE | Freq: Two times a day (BID) | ORAL | 0 refills | Status: AC
  Filled 2021-02-18: qty 40, 5d supply, fill #0

## 2021-02-18 NOTE — ED Notes (Signed)
Pt stated that he fells well and that he is not experiencing any difficulty breathing

## 2021-02-18 NOTE — ED Triage Notes (Signed)
Pt was recently exposed to a Covid and states that today he woke up with generalized body aches and not feeling well.

## 2021-02-18 NOTE — ED Provider Notes (Signed)
[facility] EMERGENCY DEPT Provider Note   CSN: [id number] Arrival date & time: 02/18/21  1002     History Chief Complaint  Patient presents with   Fever   Generalized Body Aches    Samuel Ewing is a 50 y.o. male.  HPI Patient reports he was exposed to [place] last week by a family friend.  He reports the friend tested positive for Sunday, 4 days ago.  Patient reports he started getting symptoms of cough fever and body aches 3 days ago.  He reports his maximum temperature at home was 102.  Reports he does feel generally achy.  He was nauseated.  He reports he is coughing.  He feels somewhat short of breath.  Patient reports that last May he had double pneumonia and was hospitalized for a week.  He reports since then he has had home oxygen that he continues to use at nighttime.  Patient reports he has albuterol inhalers at home as well.  He reports he has been using them.    Past Medical History:  Diagnosis Date   Anxiety    Depression    Hypertension    Migraine    Opiate dependence, continuous (HCC)    Opiate withdrawal ([place])    Pneumonia 11/14/2020    Patient Active Problem List   Diagnosis Date Noted   Physical deconditioning 10/22/2020   Pneumonia of both lungs due to infectious organism 10/19/2020   Sepsis with acute hypoxic respiratory failure ([place]) 10/19/2020   Prediabetes 10/19/2020   Pneumonia 10/19/2020   Mild episode of recurrent major depressive disorder ([place]) 06/09/2020   Essential hypertension 06/06/2020   Hyperlipidemia 06/06/2020   Migraine with aura and without status migrainosus, not intractable 06/06/2020   Generalized anxiety disorder 06/06/2020   Hypothyroidism 06/06/2020   Low testosterone 06/06/2020   Moderate episode of recurrent major depressive disorder ([place]) 06/06/2020    Past Surgical History:  Procedure Laterality Date   BACK SURGERY     CERVICAL SPINE SURGERY         Family History  Problem Relation Age of Onset    Heart failure Mother    Heart failure Father    Heart attack Father     Social History   Tobacco Use   Smoking status: Former    Types: Cigarettes, E-cigarettes    Start date: 1986   Smokeless tobacco: Never  Vaping Use   Vaping Use: Every day   Start date: 06/28/2017   Substances: Nicotine  Substance Use Topics   Alcohol use: Never   Drug use: Yes    Types: Marijuana    Home Medications Prior to Admission medications   Medication Sig Start Date End Date Taking? Authorizing Provider  amLODipine (NORVASC) 10 MG tablet Take 1 tablet (10 mg total) by mouth daily. 11/13/20  Yes Ladell Pier, MD  atorvastatin (LIPITOR) 20 MG tablet Take 1 tablet by mouth once daily 02/04/21  Yes Ladell Pier, MD  busPIRone (BUSPAR) 30 MG tablet Take 1 tablet (30 mg total) by mouth 2 (two) times daily. 01/15/21  Yes Eulis Canner E, NP  diazepam (VALIUM) 5 MG tablet Take 1 tablet by mouth once daily 01/27/21  Yes Eulis Canner E, NP  DULoxetine (CYMBALTA) 60 MG capsule Take 1 capsule (60 mg total) by mouth 2 (two) times daily. 01/15/21  Yes Eulis Canner E, NP  HYDROmorphone (DILAUDID) 4 MG tablet Take 8 mg by mouth every 6 (six) hours as needed for severe pain. 09/22/20  Yes [provider]  levothyroxine (SYNTHROID) 50 MCG tablet Take 1 tablet (50 mcg total) by mouth daily. 11/13/20  Yes Ladell Pier, MD  lisinopril (ZESTRIL) 20 MG tablet Take 1 tablet (20 mg total) by mouth daily. 11/13/20  Yes Ladell Pier, MD  molnupiravir EUA 200 mg CAPS Take 4 capsules (800 mg total) by mouth 2 (two) times daily for 5 days. 02/18/21 02/23/21 Yes Charlesetta Shanks, MD  propranolol (INDERAL) 10 MG tablet Take 1 tablet (10 mg total) by mouth 3 (three) times daily. 01/15/21  Yes Eulis Canner E, NP  ziprasidone (GEODON) 40 MG capsule Take 1 capsule (40 mg total) by mouth 2 (two) times daily with a meal. 01/15/21  Yes Eulis Canner E, NP  Accu-Chek Softclix Lancets lancets Use as  instructed 12/08/20   Ladell Pier, MD  albuterol (VENTOLIN HFA) 108 (90 Base) MCG/ACT inhaler Inhale 2 puffs into the lungs every 4 (four) hours as needed for wheezing or shortness of breath. Patient not taking: Reported on 01/07/2021 10/24/20   Dwyane Dee, MD  Blood Glucose Monitoring Suppl (ACCU-CHEK GUIDE ME) w/Device KIT Check BS in Q a.m before breakfast 12/08/20   Ladell Pier, MD  lidocaine (LIDODERM) 5 % Place 1 patch onto the skin daily. Remove & Discard patch within 12 hours or as directed by MD 08/17/20   Caccavale, Sophia, PA-C  promethazine (PHENERGAN) 12.5 MG tablet Take 12.5 mg by mouth every 6 (six) hours as needed for nausea or vomiting. 09/12/20   [provider]  SUMAtriptan (IMITREX) 100 MG tablet Take 100 mg by mouth daily as needed for migraine. May repeat in 2 hours if headache persists or recurs.    [provider]    Allergies    Morphine and related  Review of Systems   Review of Systems 10 systems reviewed and negative except as per HPI Physical Exam Updated Vital Signs BP 111/69   Pulse 90   Temp (!) 100.4 F (38 C) (Oral)   Resp 18   Ht 5' 10"  (1.778 m)   Wt 74.8 kg   SpO2 96%   BMI 23.68 kg/m   Physical Exam Constitutional:      Comments: Alert nontoxic clear mental status no respiratory distress  HENT:     Head: Normocephalic and atraumatic.     Mouth/Throat:     Pharynx: Oropharynx is clear.  Eyes:     Extraocular Movements: Extraocular movements intact.  Cardiovascular:     Rate and Rhythm: Normal rate and regular rhythm.  Pulmonary:     Effort: Pulmonary effort is normal.     Breath sounds: Normal breath sounds.  Abdominal:     General: There is no distension.     Palpations: Abdomen is soft.     Tenderness: There is no abdominal tenderness. There is no guarding.  Musculoskeletal:        General: No swelling or tenderness. Normal range of motion.     Right lower leg: No edema.     Left lower leg: No edema.   Skin:    General: Skin is warm.  Neurological:     General: No focal deficit present.     Mental Status: He is oriented to person, place, and time.     Coordination: Coordination normal.  Psychiatric:        Mood and Affect: Mood normal.    ED Results / Procedures / Treatments   Labs (all labs ordered are listed, but only  abnormal results are displayed) Labs Reviewed  RESP PANEL BY RT-PCR (FLU A&B, COVID) ARPGX2 - Abnormal; Notable for the following components:      Result Value   SARS Coronavirus 2 by RT PCR POSITIVE (*)    All other components within normal limits    EKG None  Radiology DG Chest Port 1 View  Result Date: 02/18/2021 CLINICAL DATA:  Cough, shortness of breath, recently exposed to COVID. EXAM: PORTABLE CHEST 1 VIEW COMPARISON:  Chest radiograph 6/122022 FINDINGS: The heart size and mediastinal contours are within normal limits. No focal consolidation, pleural effusion, or pneumothorax. A 0.8 cm round nodular opacity is noted in the lateral right lower lobe, projecting at the level of the right anterior sixth rib. Mild pulmonary interstitial thickening, similar to 12/07/2020, likely sequela prior COVID-19 pneumonia. Partially visualized cervical hardware. IMPRESSION: 1. No active cardiopulmonary disease. 2. A 0.8 cm round nodular opacity projecting over the lateral right lower lobe could represent nipple shadow versus pulmonary nodule. Recommend repeat imaging with nipple markers. Electronically Signed   By: Ileana Roup M.D.   On: 02/18/2021 12:08    Procedures Procedures   Medications Ordered in ED Medications  ibuprofen (ADVIL) tablet 800 mg (800 mg Oral Given 02/18/21 1201)    ED Course  I have reviewed the triage vital signs and the nursing notes.  Pertinent labs & imaging results that were available during my care of the patient were reviewed by me and considered in my medical decision making (see chart for details).    MDM Rules/Calculators/A&P                            Patient test positive for COVID.  He is clinically well at this time without respiratory distress or hypoxia.  Patient does not appear to be dehydrated.  He is tolerating oral intake.  Patient does have risk factors for complicated course.  He does use oxygen at home at nighttime with history of severe double pneumonia and hypertension.  Will opt to treat.  Treatment plan reviewed with pharmacy and patient is better candidate for molnupiravir due to multiple potential interactions and dose adjustments required for Paxlovid.   Samuel Ewing was evaluated in Emergency Department on 02/18/2021 for the symptoms described in the history of present illness. He was evaluated in the context of the global COVID-19 pandemic, which necessitated consideration that the patient might be at risk for infection with the SARS-CoV-2 virus that causes COVID-19. Institutional protocols and algorithms that pertain to the evaluation of patients at risk for COVID-19 are in a state of rapid change based on information released by regulatory bodies including the CDC and federal and state organizations. These policies and algorithms were followed during the patient's care in the ED.  Final Clinical Impression(s) / ED Diagnoses Final diagnoses:  [id number]    Rx / DC Orders ED Discharge Orders          Ordered    nirmatrelvir/ritonavir EUA (PAXLOVID) 20 x 150 MG & 10 x 100MG TABS  2 times daily,   Status:  Discontinued       Note to Pharmacy: Patient GFR is 60 . Take nirmatrelvir (150 mg) two tablets twice daily for 5 days and ritonavir (100 mg) one tablet twice daily for 5 days.   02/18/21 1322    molnupiravir EUA 200 mg CAPS  2 times daily        02/18/21 1331  Charlesetta Shanks, MD 02/18/21 [id number]

## 2021-02-18 NOTE — Discharge Instructions (Addendum)
1.  Continue take ibuprofen and Tylenol at home for fever and body aches.   2.  Use your albuterol inhaler every 2-4 hours for coughing or wheezing. 3.  Schedule follow-up appointment with your doctor for recheck. 4.  Return to the emergency department for having significant worsening symptoms such as difficulty breathing, cannot tolerate fluids, vomiting or other concerning symptoms. 5.  A prescription has been sent to the pharmacy for

## 2021-02-23 ENCOUNTER — Other Ambulatory Visit (HOSPITAL_COMMUNITY): Payer: Self-pay | Admitting: Psychiatry

## 2021-02-23 ENCOUNTER — Ambulatory Visit: Payer: Self-pay | Admitting: *Deleted

## 2021-02-23 DIAGNOSIS — F411 Generalized anxiety disorder: Secondary | ICD-10-CM

## 2021-02-23 NOTE — Telephone Encounter (Signed)
Provided information regarding Covid recovery, retesting, when to return to work, symptoms to monitor for and seek treatment with immediately.

## 2021-03-11 ENCOUNTER — Telehealth (INDEPENDENT_AMBULATORY_CARE_PROVIDER_SITE_OTHER): Payer: 59 | Admitting: Psychiatry

## 2021-03-11 ENCOUNTER — Encounter (HOSPITAL_COMMUNITY): Payer: Self-pay | Admitting: Psychiatry

## 2021-03-11 ENCOUNTER — Other Ambulatory Visit: Payer: Self-pay

## 2021-03-11 DIAGNOSIS — F411 Generalized anxiety disorder: Secondary | ICD-10-CM | POA: Diagnosis not present

## 2021-03-11 DIAGNOSIS — F331 Major depressive disorder, recurrent, moderate: Secondary | ICD-10-CM | POA: Diagnosis not present

## 2021-03-11 MED ORDER — PROPRANOLOL HCL 10 MG PO TABS: 10.0000 mg | ORAL_TABLET | Freq: Three times a day (TID) | ORAL | 3 refills | Status: DC

## 2021-03-11 MED ORDER — DULOXETINE HCL 60 MG PO CPEP: 60.0000 mg | ORAL_CAPSULE | Freq: Two times a day (BID) | ORAL | 3 refills | Status: DC

## 2021-03-11 MED ORDER — BUSPIRONE HCL 30 MG PO TABS: 30.0000 mg | ORAL_TABLET | Freq: Two times a day (BID) | ORAL | 3 refills | Status: DC

## 2021-03-11 MED ORDER — ZIPRASIDONE HCL 40 MG PO CAPS: 40.0000 mg | ORAL_CAPSULE | Freq: Two times a day (BID) | ORAL | 3 refills | Status: DC

## 2021-03-11 NOTE — Progress Notes (Signed)
[facility] MD/PA/NP OP Progress Note Virtual Visit via Telephone Note  I connected with Samuel Ewing on 03/11/21 at  3:00 PM EDT by telephone and verified that I am speaking with the correct person using two identifiers.  Location: Patient: home Provider: Clinic   I discussed the limitations, risks, security and privacy concerns of performing an evaluation and management service by telephone and the availability of in person appointments. I also discussed with the patient that there may be a patient responsible charge related to this service. The patient expressed understanding and agreed to proceed.   I provided 30 minutes of non-face-to-face time during this encounter.      03/11/2021 3:00 PM Samuel Ewing  MRN:  [id number]  Chief Complaint: "The propanol helps my anxiety""  HPI: 50 year old male seen today for follow up psychiatric evaluation.  He has a psychiatric history of anxiety and depression. He is currently managed on BuSpar 30 mg twice daily, Cymbalta 60 mg twice daily, hydroxyzine 50 mg three times daily, Valium 5 mg once daily, and Geodon 40 twice daily (which he notes he takes for anxiety). He notes that his medications are somewhat effective in managing his psychiatric conditions.   Today patient was unable to login virtually so his exam was done over the phone. During assessment he was he is cooperative and engaged in conversation. He informed provider that propanolol has helped his anxiety.  He notes that sometimes it is difficult for him to go to work and complete task however reports that he is able to do it more.  Provider informed patient that prior to starting propanolol he went to the ED frequently.  Since being on propanolol he has not gone to the ED for panic attack.  Patient notes that he worries about work mostly.  Provider conducted a GAD-7 and patient scored 18, at his last visit he scored a 21.  He asked provider to increase his nighttime Valium to assist  with his sleep as well as anxiety.  Provider informed patient that Valium would not be increased.  Patient continues to be on 2 L of oxygen nightly,  has sleep apnea, and is on an opioid.  Provider asked patient when he will see his sleep pathologist.  He notes that he will see them in a couple of weeks and plans to get fitted for a CPAP machine.  Provider informed patient that sleep deprivation/oxygen deprivation can increase  insomnia and his current sleep issues would be addressed by a sleep pathologist.  He endorsed understanding and agreed.  Patient did note that he is sleeping between 5 to 6 hours nightly.   Patient informed Probation officer that he continues to be depressed most days.  He notes that he lacks motivation to do things that he once enjoyed because he is tired most days.  Provider conducted a PHQ-9 and patient scored a 22, at his last visit he scored an 18.  He notes that he has lost 45 pounds within the last 3 months because he has poor appetite.  Today he denies SI/HI/VAH, mania, or paranoia.   Patient informed Probation officer that he is getting over COVID 19.  Provider informed patient that his fatigue and decreased appetite could be a side effect of COVID.  He endorsed understanding and agreed.  Provider recommended patient follow-up with therapy.  He has an appointment on 03/19/2020.  Provider informed patient that going to therapy may better help manage his anxiety and depression as his psychiatric medications are currently  at higher doses.  Propranolol not adjusted at this time because patient on 2 other antihypertensive medications.  Provider does not want to cause hypotension.  He endorsed understanding and agreed.  He will follow-up with his sleep pathologist to help manage insomnia.  At this time his medications not adjusted.  Provider reminded patient that being on Valium as well as opioids are not recommended especially with his current health comorbidities.  He endorsed understanding and notes  that he will be weaning off of his opioids soon.  Valium not filled today as it was recently filled on 02/24/2021. No other concerns noted at this time.   Visit Diagnosis:    ICD-10-CM   1. Generalized anxiety disorder  F41.1 busPIRone (BUSPAR) 30 MG tablet    DULoxetine (CYMBALTA) 60 MG capsule    ziprasidone (GEODON) 40 MG capsule    propranolol (INDERAL) 10 MG tablet    2. Moderate episode of recurrent major depressive disorder (HCC)  F33.1 busPIRone (BUSPAR) 30 MG tablet    DULoxetine (CYMBALTA) 60 MG capsule      Past Psychiatric History: Anxiety and depression  Past Medical History:  Past Medical History:  Diagnosis Date   Anxiety    Depression    Hypertension    Migraine    Opiate dependence, continuous (HCC)    Opiate withdrawal ([place])    Pneumonia 11/14/2020    Past Surgical History:  Procedure Laterality Date   BACK SURGERY     CERVICAL SPINE SURGERY      Family Psychiatric History:  Paternal grandmother depression  Family History:  Family History  Problem Relation Age of Onset   Heart failure Mother    Heart failure Father    Heart attack Father     Social History:  Social History   Socioeconomic History   Marital status: Single    Spouse name: Not on file   Number of children: Not on file   Years of education: Not on file   Highest education level: Not on file  Occupational History   Not on file  Tobacco Use   Smoking status: Former    Types: Cigarettes, E-cigarettes    Start date: 1986   Smokeless tobacco: Never  Vaping Use   Vaping Use: Every day   Start date: 06/28/2017   Substances: Nicotine  Substance and Sexual Activity   Alcohol use: Never   Drug use: Yes    Types: Marijuana   Sexual activity: Not on file  Other Topics Concern   Not on file  Social History Narrative   Not on file   Social Determinants of Health   Financial Resource Strain: Not on file  Food Insecurity: Not on file  Transportation Needs: Not on file   Physical Activity: Not on file  Stress: Not on file  Social Connections: Not on file    Allergies:  Allergies  Allergen Reactions   Morphine And Related     Metabolic Disorder Labs: Lab Results  Component Value Date   HGBA1C 6.4 (H) 10/19/2020   MPG 136.98 10/19/2020   No results found for: PROLACTIN Lab Results  Component Value Date   CHOL 120 06/18/2020   TRIG 101 06/18/2020   HDL 42 06/18/2020   CHOLHDL 2.9 06/18/2020   LDLCALC 59 06/18/2020   Lab Results  Component Value Date   TSH 0.431 10/20/2020   TSH 1.770 06/18/2020    Therapeutic Level Labs: No results found for: LITHIUM No results found for: VALPROATE No components found  for:  CBMZ  Current Medications: Current Outpatient Medications  Medication Sig Dispense Refill   Accu-Chek Softclix Lancets lancets Use as instructed 100 each 12   albuterol (VENTOLIN HFA) 108 (90 Base) MCG/ACT inhaler Inhale 2 puffs into the lungs every 4 (four) hours as needed for wheezing or shortness of breath. (Patient not taking: Reported on 01/07/2021) 8 g 2   amLODipine (NORVASC) 10 MG tablet Take 1 tablet (10 mg total) by mouth daily. 90 tablet 1   atorvastatin (LIPITOR) 20 MG tablet Take 1 tablet by mouth once daily 30 tablet 0   Blood Glucose Monitoring Suppl (ACCU-CHEK GUIDE ME) w/Device KIT Check BS in Q a.m before breakfast 1 kit 0   busPIRone (BUSPAR) 30 MG tablet Take 1 tablet (30 mg total) by mouth 2 (two) times daily. 60 tablet 3   diazepam (VALIUM) 5 MG tablet Take 1 tablet by mouth once daily 30 tablet 0   DULoxetine (CYMBALTA) 60 MG capsule Take 1 capsule (60 mg total) by mouth 2 (two) times daily. 60 capsule 3   HYDROmorphone (DILAUDID) 4 MG tablet Take 8 mg by mouth every 6 (six) hours as needed for severe pain.     levothyroxine (SYNTHROID) 50 MCG tablet Take 1 tablet (50 mcg total) by mouth daily. 90 tablet 1   lidocaine (LIDODERM) 5 % Place 1 patch onto the skin daily. Remove & Discard patch within 12 hours or  as directed by MD 30 patch 0   lisinopril (ZESTRIL) 20 MG tablet Take 1 tablet (20 mg total) by mouth daily. 90 tablet 1   promethazine (PHENERGAN) 12.5 MG tablet Take 12.5 mg by mouth every 6 (six) hours as needed for nausea or vomiting.     propranolol (INDERAL) 10 MG tablet Take 1 tablet (10 mg total) by mouth 3 (three) times daily. 90 tablet 3   SUMAtriptan (IMITREX) 100 MG tablet Take 100 mg by mouth daily as needed for migraine. May repeat in 2 hours if headache persists or recurs.     ziprasidone (GEODON) 40 MG capsule Take 1 capsule (40 mg total) by mouth 2 (two) times daily with a meal. 60 capsule 3   No current facility-administered medications for this visit.     Musculoskeletal: Strength & Muscle Tone:  Unable to assess due to telephone visit Gait & Station:  Unable to assess due to telephone visit Patient leans: N/A  Psychiatric Specialty Exam: Review of Systems  There were no vitals taken for this visit.There is no height or weight on file to calculate BMI.  General Appearance:  Unable to assess due to telephone visit  Eye Contact:   Unable to assess due to telephone visit  Speech:  Clear and Coherent and Normal Rate  Volume:  Normal  Mood:  Anxious and Depressed  Affect:  Appropriate and Congruent  Thought Process:  Coherent, Goal Directed and Linear  Orientation:  Full (Time, Place, and Person)  Thought Content: WDL and Logical   Suicidal Thoughts:  No  Homicidal Thoughts:  No  Memory:  Immediate;   Good Recent;   Good Remote;   Good  Judgement:  Good  Insight:  Good  Psychomotor Activity:  Normal  Concentration:  Concentration: Good and Attention Span: Good  Recall:  Good  Fund of Knowledge: Good  Language: Good  Akathisia:  No  Handed:  Right  AIMS (if indicated): Right  Assets:  Communication Skills Desire for Improvement Financial Resources/Insurance Housing Social Support  ADL's:  Intact  Cognition:  WNL  Sleep:  Fair   Screenings: GAD-7     Flowsheet Row Video Visit from 03/11/2021 in [facility] Counselor from 12/18/2020 in [facility] Office Visit from 12/08/2020 in [place] Video Visit from 11/14/2020 in [facility] Video Visit from 10/29/2020 in [facility]  Total GAD-7 Score _0 PHQ2-9    Flowsheet Row Video Visit from 03/11/2021 in [facility] Counselor from 12/18/2020 in [facility] Office Visit from 12/08/2020 in [place] Video Visit from 11/14/2020 in [facility] Video Visit from 10/29/2020 in [place]  PHQ-2 Total Score _1 PHQ-9 Total Score _2 Flowsheet Row Video Visit from 03/11/2021 in [facility] ED from 02/18/2021 in [facility] Counselor from 12/18/2020 in [place] No Risk No Risk No Risk        Assessment and Plan: Patient notes that his anxiety and depression has improved since his last visit.  He reports that his sleep continues to be bothersome and notes that he sleeps 5 to 6 hours nightly.  Provider discussed patient following up with his sleep pathologist to better help his sleep which she was agreeable to.  At this time medications not readjusted.  Patient has an appointment with therapy on 03/19/2021 to help manage anxiety and depression.  He will continue all meds as prescribed.  Valium not filled today as it was recently filled on 02/24/2021.  1. Generalized anxiety disorder  Continue- propranolol (INDERAL) 10 MG tablet; Take 1 tablet (10 mg total) by mouth 3 (three) times daily.  Dispense: 90 tablet; Refill: 2 Continue- ziprasidone (GEODON) 40 MG capsule; Take 1 capsule  (40 mg total) by mouth 2 (two) times daily with a meal.  Dispense: 60 capsule; Refill: 2 Continue- DULoxetine (CYMBALTA) 60 MG capsule; Take 1 capsule (60 mg total) by mouth 2 (two) times daily.  Dispense: 60 capsule; Refill: 2 Continue- busPIRone (BUSPAR) 30 MG tablet; Take 1 tablet (30 mg total) by mouth 2 (two) times daily.  Dispense: 60 tablet; Refill: 2  2. Moderate episode of recurrent major depressive disorder (HCC)  Continue- DULoxetine (CYMBALTA) 60 MG capsule; Take 1 capsule (60 mg total) by mouth 2 (two) times daily.  Dispense: 60 capsule; Refill: 2 Continue- busPIRone (BUSPAR) 30 MG tablet; Take 1 tablet (30 mg total) by mouth 2 (two) times daily.  Dispense: 60 tablet; Refill: 2     Follow up in 2 months Follow-up with therapy  Salley Slaughter, NP 03/11/2021, 3:00 PM

## 2021-03-19 ENCOUNTER — Other Ambulatory Visit: Payer: Self-pay

## 2021-03-19 ENCOUNTER — Encounter: Payer: Self-pay | Admitting: Pulmonary Disease

## 2021-03-19 ENCOUNTER — Ambulatory Visit (INDEPENDENT_AMBULATORY_CARE_PROVIDER_SITE_OTHER): Payer: 59 | Admitting: Pulmonary Disease

## 2021-03-19 ENCOUNTER — Ambulatory Visit (INDEPENDENT_AMBULATORY_CARE_PROVIDER_SITE_OTHER): Payer: 59 | Admitting: Clinical

## 2021-03-19 DIAGNOSIS — G4733 Obstructive sleep apnea (adult) (pediatric): Secondary | ICD-10-CM | POA: Diagnosis not present

## 2021-03-19 DIAGNOSIS — G4734 Idiopathic sleep related nonobstructive alveolar hypoventilation: Secondary | ICD-10-CM | POA: Diagnosis not present

## 2021-03-19 DIAGNOSIS — F411 Generalized anxiety disorder: Secondary | ICD-10-CM

## 2021-03-19 NOTE — Progress Notes (Signed)
   THERAPIST PROGRESS NOTE Virtual Visit via Video Note  I connected with Samuel Ewing on 03/19/21 at  9:00 AM EDT by a video enabled telemedicine application and verified that I am speaking with the correct person using two identifiers.  Location: Patient: home Provider: office   I discussed the limitations of evaluation and management by telemedicine and the availability of in person appointments. The patient expressed understanding and agreed to proceed.   Follow Up Instructions: I discussed the assessment and treatment plan with the patient. The patient was provided an opportunity to ask questions and all were answered. The patient agreed with the plan and demonstrated an understanding of the instructions.   The patient was advised to call back or seek an in-person evaluation if the symptoms worsen or if the condition fails to improve as anticipated.   Session Time: 20 minutes  Participation Level: Active  Behavioral Response: CasualAlertAnxious  Type of Therapy: Individual Therapy  Treatment Goals addressed: Coping  Interventions: CBT and Supportive  Summary:  Samuel Ewing is a 50 y.o. male who presents for the scheduled session oriented x5, appropriately dressed, and friendly.  Client denied hallucinations and delusions. Client reported on today he has been managing fairly well but has been struggling with symptoms of anxiety.  Client reported that his anxiety has been a constant disabling feeling in his daily life.  Client reported he does not feel like the psych medications are helpful but the beta-blocker he is taking has been helpful to some extent.  Client reported experiencing sweating, grinding teeth, and difficulty breathing daily.  Client reported he has been back to work over the past month but still finds that some days he has to leave work early because his anxiety is too overwhelming.  Client reported that he did not know what his triggers are but his  anxiety first thing in the morning and then fluctuates throughout the day.  Client reported that he does not sleep well during the night and he has lost 45 pounds over the past couple of months because of his symptoms of anxiety decrease his appetite.  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    Suicidal/Homicidal: Nowithout intent/plan  Therapist Response:  Therapist began the appointment asking the client how he has been doing since last seen. Therapist used CBT to utilize active listening and positive emotional support towards client's feelings. Therapist used CBT to engage client to ask open-ended questions about triggers and automatic thoughts that provoke anxiety symptoms. Therapist used CBT to engage with the client to ask him to identify coping strategies that he has utilized to help manage his symptoms. Therapist assigned the client homework to practice using his 5 senses to help alleviate anxiety symptoms. Client was scheduled for next appointment.     Plan: Return again in 3 weeks.  Diagnosis: Generalized anxiety disorder   Neena Rhymes Briona Korpela, LCSW 03/19/2021

## 2021-03-19 NOTE — Patient Instructions (Signed)
CPAP titration study Rx for autoCPAP 5-15 cm with nasal mask Oxygen can be blended into CPAP

## 2021-03-19 NOTE — Progress Notes (Signed)
Subjective:    Patient ID: Samuel Ewing, male    DOB: 22-Jul-1970, 50 y.o.   MRN: [id number]  HPI  Chief Complaint  Patient presents with   Consult    Difficulty falling asleep, excessive loud snoring.  Had a sleep study.  Here to go over sleep study.  Oxygen 2L at HS only since May 7571.   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  PMH -chronic neck pain on narcotics, severe anxiety on BuSpar, Geodon, diazepam and Cymbalta Hypertension Chronic headaches Hyperlipidemia. 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 He had neck surgery which was complicated by aspiration pneumonia.  He was discharged on oxygen.  He was evaluated by my partner Dr. Tonia Brooms, nocturnal oximetry showed persistent desaturation and nocturnal oxygen was continued.  Home sleep study was obtained which showed moderate OSA with nocturnal hypoxia out of proportion and even in the absence of respiratory events, hence he was referred to me.  Reports sleepiness score is 5 but he reports excessive daytime fatigue.  His mother has noted loud snoring and witnessed apneas. Bedtime is between 10 and 11 PM, sleep latency about an hour, he sleeps on his right side with 1 pillow, reports 3-4 nocturnal awakenings including nocturia and is out of bed at 7:30 AM feeling tired with dryness of mouth and occasional headaches. He naps for about 2 hours on the couch every day and wakes up feeling tired There is no history suggestive of cataplexy, sleep paralysis or parasomnias  He follows up at the pain center with Dr. Horald Chestnut and the plan is to decrease Dilaudid gradually and taper to off   He smoked 2 to 3 cigarettes/day for 20 years, now vapes  Significant tests/ events reviewed  01/2021 HST showed moderate  OSA with AHI 20/ hr Significant oxygen desaturations were noted even in the absence of apneas or hypopneas suggesting underlying cardiopulmonary disease 01/2021 ONO /RA   3hr  satn < 88%   CT angiogram chest 09/2020 multifocal pneumonia  Past Medical History:  Diagnosis Date   Anxiety    Depression    Hypertension    Migraine    Opiate dependence, continuous (HCC)    Opiate withdrawal (HCC)    Pneumonia 11/14/2020   Past Surgical History:  Procedure Laterality Date   BACK SURGERY     CERVICAL SPINE SURGERY      Allergies  Allergen Reactions   Morphine And Related     Social History   Socioeconomic History   Marital status: Single    Spouse name: Not on file   Number of children: Not on file   Years of education: Not on file   Highest education level: Not on file  Occupational History   Not on file  Tobacco Use   Smoking status: Former    Types: Cigarettes, E-cigarettes    Start date: 58    Quit date: 03/17/2018    Years since quitting: 3.0   Smokeless tobacco: Never   Tobacco comments:    Smoked 2-3 cigarettes per day when smoking.    Currently vaping 1 cartridge per 2 days.  Vaping Use   Vaping Use: Every day   Start date: 06/28/2017   Substances: Nicotine  Substance and Sexual Activity   Alcohol use: Never   Drug use: Yes    Types: Marijuana   Sexual activity: Not on file  Other Topics Concern   Not on file  Social History Narrative  Not on file   Social Determinants of Health   Financial Resource Strain: Not on file  Food Insecurity: Not on file  Transportation Needs: Not on file  Physical Activity: Not on file  Stress: Not on file  Social Connections: Not on file  Intimate Partner Violence: Not on file    Family History  Problem Relation Age of Onset   Heart failure Mother    Heart failure Father    Heart attack Father       Review of Systems Nonproductive cough Weight loss Problems Nasal congestion Anxiety  Constitutional: negative for anorexia, fevers and sweats  Eyes: negative for irritation, redness and visual disturbance  Ears, nose, mouth, throat, and face: negative for earaches,  epistaxis, nasal congestion and sore throat  Respiratory: negative for  dyspnea on exertion, sputum and wheezing  Cardiovascular: negative for chest pain, dyspnea, lower extremity edema, orthopnea, palpitations and syncope  Gastrointestinal: negative for abdominal pain, constipation, diarrhea, melena, nausea and vomiting  Genitourinary:negative for dysuria, frequency and hematuria  Hematologic/lymphatic: negative for bleeding, easy bruising and lymphadenopathy  Musculoskeletal:negative for arthralgias, muscle weakness and stiff joints  Neurological: negative for coordination problems, gait problems, headaches and weakness  Endocrine: negative for diabetic symptoms including polydipsia, polyuria and weight loss     Objective:   Physical Exam  Gen. Pleasant, thin man in no distress, normal affect ENT - no pallor,icterus, no post nasal drip, class II airway, overbite Neck: No JVD, no thyromegaly, no carotid bruits Lungs: no use of accessory muscles, no dullness to percussion, clear without rales or rhonchi  Cardiovascular: Rhythm regular, heart sounds  normal, no murmurs or gallops, no peripheral edema Abdomen: soft and non-tender, no hepatosplenomegaly, BS normal. Musculoskeletal: No deformities, no cyanosis or clubbing Neuro:  alert, non focal        Assessment & Plan:

## 2021-03-19 NOTE — Assessment & Plan Note (Signed)
On HST, Oxygen desaturations were noted even in the absence of respiratory events suggesting underlying cardiopulmonary disease.  His history of smoking is not very impressive.  It is possible that his chronic opiate use is contributing to some degree of hypoventilation during sleep.  It is very encouraging that he is coming off opiates after his neck surgery and I expect that his nocturnal hypoxia will improve.

## 2021-03-19 NOTE — Assessment & Plan Note (Signed)
We reviewed his home sleep test which shows moderate OSA with AHI 20/hour. In the meantime, we will arrange for CPAP titration study to see what pressure is required as well as need for oxygen.  We will proceed with initiating auto CPAP 5 to 15 cm and he can use this with a nasal mask and with oxygen blended in until we obtain results of the titration study  The pathophysiology of obstructive sleep apnea , it's cardiovascular consequences & modes of treatment including CPAP were discused with the patient in detail & they evidenced understanding.

## 2021-03-20 ENCOUNTER — Other Ambulatory Visit: Payer: Self-pay | Admitting: Internal Medicine

## 2021-03-20 MED ORDER — ATORVASTATIN CALCIUM 20 MG PO TABS: 20.0000 mg | ORAL_TABLET | Freq: Every day | ORAL | 0 refills | Status: DC

## 2021-03-20 NOTE — Telephone Encounter (Signed)
Medication Refill - Medication:  atorvastatin (LIPITOR) 20 MG tablet   Has the patient contacted their pharmacy? Yes.   Contact PCP, no reply back when pharmacy sent request.  Preferred Pharmacy (with phone number or street name):  [facility] [address], [place] - [address]  [address] [place] [address]  Phone:  [phone]  Fax:  [phone]   Has the patient been seen for an appointment in the last year OR does the patient have an upcoming appointment? Yes.    Agent: Please be advised that RX refills may take up to 3 business days. We ask that you follow-up with your pharmacy.

## 2021-03-23 ENCOUNTER — Other Ambulatory Visit (HOSPITAL_COMMUNITY): Payer: Self-pay | Admitting: Psychiatry

## 2021-03-23 DIAGNOSIS — F411 Generalized anxiety disorder: Secondary | ICD-10-CM

## 2021-04-09 ENCOUNTER — Ambulatory Visit (INDEPENDENT_AMBULATORY_CARE_PROVIDER_SITE_OTHER): Payer: 59 | Admitting: Clinical

## 2021-04-09 ENCOUNTER — Other Ambulatory Visit: Payer: Self-pay

## 2021-04-09 DIAGNOSIS — F411 Generalized anxiety disorder: Secondary | ICD-10-CM | POA: Diagnosis not present

## 2021-04-11 ENCOUNTER — Other Ambulatory Visit (HOSPITAL_COMMUNITY): Payer: Self-pay | Admitting: Psychiatry

## 2021-04-11 DIAGNOSIS — F411 Generalized anxiety disorder: Secondary | ICD-10-CM

## 2021-04-13 NOTE — Progress Notes (Signed)
   THERAPIST PROGRESS NOTE Virtual Visit via Video Note  I connected with Samuel Ewing on 04/09/2021 at  9:00 AM EDT by a video enabled telemedicine application and verified that I am speaking with the correct person using two identifiers.  Location: Patient: home Provider: office   I discussed the limitations of evaluation and management by telemedicine and the availability of in person appointments. The patient expressed understanding and agreed to proceed.   Follow Up Instructions: I discussed the assessment and treatment plan with the patient. The patient was provided an opportunity to ask questions and all were answered. The patient agreed with the plan and demonstrated an understanding of the instructions.   The patient was advised to call back or seek an in-person evaluation if the symptoms worsen or if the condition fails to improve as anticipated.   Session Time: 30 minutes  Participation Level: Active  Behavioral Response: CasualAlertAnxious  Type of Therapy: Individual Therapy  Treatment Goals addressed: Coping  Interventions: CBT and Supportive  Summary:  Samuel Ewing is a 50 y.o. male who presents for the scheduled session oriented x5, appropriately dressed, and friendly.  Client denied hallucinations and delusions.  Client reported on today he is doing about the same since last seen.  Client reported his anxiety wakes him up early in the morning.  Client reported he has anxiety until he gets to his next dosage of medications during the day.  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  Client reported he is worried about having negative effects coming down from his prescription of Dilaudid.  Client reported he has been on pain medication for the past 7 months.  Client reported his anxiety continues to be exacerbated by increased heart rate, elevated blood  pressure, grinding his teeth, fidgeting, and pacing the floor.  Client reported he has ongoing difficulty being able to complete full shift at work because of his symptoms.  Client reported he is not yet ready to consider disability because he wants to be able to do things on his own.  Client reported he is compliant with his psychiatric medications.  Client reported he is able to sleep about 6 to 7 hours per night.    Suicidal/Homicidal: Nowithout intent/plan  Therapist Response:  Therapist began the appointment asking the client how he's been doing since last seen. Therapist used CBT to utilize active listening and positive emotional support. Therapist used CBT to have the client identify when anxiety interferes with his daily activity and his mind set.  Therapist used CBT to ask the client about his ability to effectively apply coping skills such as deep breathing. Therapist assigned the client homework to insert positive thinking and continue practicing guided meditations. Client was scheduled for next appointment.    Plan: Return again in 4 weeks.  Diagnosis: Generalized anxiety disorder    Neena Rhymes Amazin Pincock, LCSW 04/09/2021

## 2021-04-23 ENCOUNTER — Telehealth (HOSPITAL_BASED_OUTPATIENT_CLINIC_OR_DEPARTMENT_OTHER): Payer: 59 | Admitting: Internal Medicine

## 2021-04-23 DIAGNOSIS — I10 Essential (primary) hypertension: Secondary | ICD-10-CM | POA: Diagnosis not present

## 2021-04-23 DIAGNOSIS — G4733 Obstructive sleep apnea (adult) (pediatric): Secondary | ICD-10-CM | POA: Diagnosis not present

## 2021-04-23 DIAGNOSIS — E785 Hyperlipidemia, unspecified: Secondary | ICD-10-CM

## 2021-04-23 DIAGNOSIS — E039 Hypothyroidism, unspecified: Secondary | ICD-10-CM | POA: Diagnosis not present

## 2021-04-23 DIAGNOSIS — D649 Anemia, unspecified: Secondary | ICD-10-CM

## 2021-04-23 DIAGNOSIS — G8929 Other chronic pain: Secondary | ICD-10-CM

## 2021-04-23 DIAGNOSIS — Z23 Encounter for immunization: Secondary | ICD-10-CM

## 2021-04-23 MED ORDER — LEVOTHYROXINE SODIUM 50 MCG PO TABS: 50.0000 ug | ORAL_TABLET | Freq: Every day | ORAL | 1 refills | Status: DC

## 2021-04-23 MED ORDER — AMLODIPINE BESYLATE 10 MG PO TABS: 10.0000 mg | ORAL_TABLET | Freq: Every day | ORAL | 1 refills | Status: DC

## 2021-04-23 MED ORDER — LISINOPRIL 20 MG PO TABS: 20.0000 mg | ORAL_TABLET | Freq: Every day | ORAL | 1 refills | Status: DC

## 2021-04-23 MED ORDER — ATORVASTATIN CALCIUM 20 MG PO TABS: 20.0000 mg | ORAL_TABLET | Freq: Every day | ORAL | 1 refills | Status: DC

## 2021-04-23 NOTE — Progress Notes (Signed)
Virtual Visit via Telephone Note  I connected with Samuel Ewing on 04/23/2021 at 8:36 AM by telephone and verified that I am speaking with the correct person using two identifiers  Location: Patient: home Provider: office  Participants: Myself Patient   I discussed the limitations, risks, security and privacy concerns of performing an evaluation and management service by telephone and the availability of in person appointments. I also discussed with the patient that there may be a patient responsible charge related to this service. The patient expressed understanding and agreed to proceed.   History of Present Illness: Patient with history of hypertension, HL, migraines, anxiety disorder, hypothyroidism, family history of heart disease in both parents, parainfluenza pneumonia 09/2020, preDM (while on steroids for pneumonia), normocytic anemia  Pt reports his pain specialist is weaning Dilaudid.  Should be off by 05/11/21. Reports HR has been going into the 120-130s at times by it comes down when he takes the Dilaudid.  Thinks due to mild withdrawal and anxiety of coming off med.  Currently at 2 mg Q4 hrs. -TSH ordered on last visit but he did not come to the lab to have it done.  Reports compliance with levothyroxine.  He also has a mild anemia with iron studies consistent with anemia of chronic disease.    I referred to pulmonary on last visit with me due to noctural hypoxia. Sleep study was done and he was positive for OSA. CPAP ordered. He tells me that Samuel Ewing plans to do another Pox at night once he gets off Dilaudid -did not get HRCT as ordered.  States that he went back to work and was a little busy but he will call to reschedule in due time.  HTN: taking Lisinopril, Norvasc, Propranolol Checking BP Q 3 days.   Only reading he can recall at this time is 125/86  HL: Reports compliance with atorvastatin  HM:  plans to get flu shot at  outside pharmacy.  Also needs Prevnar 20.  I  told him when he goes to the pharmacy he should request both vaccines and have them send me documentation once he has received them.  I also request that when he comes to the lab to have blood test done, he stopped at the front desk to sign a release for [place] to get copy of his colonoscopy report from [place].  He needs refills on all of his medications.  Outpatient Encounter Medications as of 04/23/2021  Medication Sig   Accu-Chek Softclix Lancets lancets Use as instructed   albuterol (VENTOLIN HFA) 108 (90 Base) MCG/ACT inhaler Inhale 2 puffs into the lungs every 4 (four) hours as needed for wheezing or shortness of breath.   amLODipine (NORVASC) 10 MG tablet Take 1 tablet (10 mg total) by mouth daily.   atorvastatin (LIPITOR) 20 MG tablet Take 1 tablet (20 mg total) by mouth daily.   Blood Glucose Monitoring Suppl (ACCU-CHEK GUIDE ME) w/Device KIT Check BS in Q a.m before breakfast   busPIRone (BUSPAR) 30 MG tablet Take 1 tablet (30 mg total) by mouth 2 (two) times daily.   diazepam (VALIUM) 5 MG tablet Take 1 tablet by mouth once daily   DULoxetine (CYMBALTA) 60 MG capsule Take 1 capsule (60 mg total) by mouth 2 (two) times daily.   HYDROmorphone (DILAUDID) 4 MG tablet Take 8 mg by mouth every 6 (six) hours as needed for severe pain.   levothyroxine (SYNTHROID) 50 MCG tablet Take 1 tablet (50 mcg total) by mouth daily.  lidocaine (LIDODERM) 5 % Place 1 patch onto the skin daily. Remove & Discard patch within 12 hours or as directed by MD   lisinopril (ZESTRIL) 20 MG tablet Take 1 tablet (20 mg total) by mouth daily.   promethazine (PHENERGAN) 12.5 MG tablet Take 12.5 mg by mouth every 6 (six) hours as needed for nausea or vomiting.   propranolol (INDERAL) 10 MG tablet Take 1 tablet (10 mg total) by mouth 3 (three) times daily.   SUMAtriptan (IMITREX) 100 MG tablet Take 100 mg by mouth daily as needed for migraine. May repeat in 2 hours if headache persists or recurs.   ziprasidone  (GEODON) 40 MG capsule Take 1 capsule (40 mg total) by mouth 2 (two) times daily with a meal.     Observations/Objective:   Chemistry      Component Value Date/Time   NA 135 12/07/2020 0829   K 4.2 12/07/2020 0829   CL 100 12/07/2020 0829   CO2 27 12/07/2020 0829   BUN 15 12/07/2020 0829   CREATININE 0.86 12/07/2020 0829      Component Value Date/Time   CALCIUM 8.5 (L) 12/07/2020 0829   ALKPHOS 48 10/20/2020 0529   AST 30 10/20/2020 0529   ALT 18 10/20/2020 0529   BILITOT 0.2 (L) 10/20/2020 0529   BILITOT 0.4 06/18/2020 1003     Lab Results  Component Value Date   WBC 6.1 12/07/2020   HGB 10.2 (L) 12/07/2020   HCT 29.9 (L) 12/07/2020   MCV 92.3 12/07/2020   PLT 178 12/07/2020     Assessment and Plan: 1. Essential hypertension Advised patient to keep a record of his blood pressure readings with goal being 130/80 or lower. - lisinopril (ZESTRIL) 20 MG tablet; Take 1 tablet (20 mg total) by mouth daily.  Dispense: 90 tablet; Refill: 1 - amLODipine (NORVASC) 10 MG tablet; Take 1 tablet (10 mg total) by mouth daily.  Dispense: 90 tablet; Refill: 1  2. Hypothyroidism, unspecified type I think his palpitations may be related to withdrawal and anxiety as he indicated.  However we will check a TSH level also to make sure he is in normal range - levothyroxine (SYNTHROID) 50 MCG tablet; Take 1 tablet (50 mcg total) by mouth daily.  Dispense: 90 tablet; Refill: 1 - TSH; Future  3. Normocytic anemia He is agreeable for [place] to recheck his CBC.  He will stop by and sign a release for [place] to get his colonoscopy report from [place]. - CBC; Future  4. OSA (obstructive sleep apnea) Patient awaiting CPAP machine as ordered by Dr. Elsworth Soho  5. Hyperlipidemia, unspecified hyperlipidemia type - atorvastatin (LIPITOR) 20 MG tablet; Take 1 tablet (20 mg total) by mouth daily.  Dispense: 90 tablet; Refill: 1  6. Other chronic pain Being managed by the pain specialist.  He tells me that  the pain specialist is working with him on a slow taper to help decrease withdrawal symptoms.  7. Need for influenza vaccination 8. Need for vaccination against Streptococcus pneumoniae Advised patient that once he gets the flu shot and Prevnar 20 vaccines at his outside pharmacy, he should have them send me documentation so that I can update his health maintenance.  He expressed understanding.   Follow Up Instructions: 4 mths   I discussed the assessment and treatment plan with the patient. The patient was provided an opportunity to ask questions and all were answered. The patient agreed with the plan and demonstrated an understanding of the instructions.   The  patient was advised to call back or seek an in-person evaluation if the symptoms worsen or if the condition fails to improve as anticipated.  I  Spent 14 minutes on this telephone encounter  Karle Plumber, MD

## 2021-05-12 ENCOUNTER — Ambulatory Visit (HOSPITAL_BASED_OUTPATIENT_CLINIC_OR_DEPARTMENT_OTHER): Payer: 59 | Attending: Pulmonary Disease | Admitting: Pulmonary Disease

## 2021-05-12 ENCOUNTER — Other Ambulatory Visit: Payer: Self-pay

## 2021-05-12 ENCOUNTER — Other Ambulatory Visit (HOSPITAL_BASED_OUTPATIENT_CLINIC_OR_DEPARTMENT_OTHER): Payer: Self-pay

## 2021-05-12 DIAGNOSIS — G4734 Idiopathic sleep related nonobstructive alveolar hypoventilation: Secondary | ICD-10-CM | POA: Diagnosis not present

## 2021-05-12 DIAGNOSIS — G4733 Obstructive sleep apnea (adult) (pediatric): Secondary | ICD-10-CM | POA: Insufficient documentation

## 2021-05-12 MED ORDER — INFLUENZA VAC SPLIT QUAD 0.5 ML IM SUSY
PREFILLED_SYRINGE | INTRAMUSCULAR | 0 refills | Status: AC
  Filled 2021-05-12: qty 0.5, 1d supply, fill #0

## 2021-05-14 ENCOUNTER — Ambulatory Visit (INDEPENDENT_AMBULATORY_CARE_PROVIDER_SITE_OTHER): Payer: 59 | Admitting: Clinical

## 2021-05-14 DIAGNOSIS — F411 Generalized anxiety disorder: Secondary | ICD-10-CM | POA: Diagnosis not present

## 2021-05-15 ENCOUNTER — Telehealth: Payer: Self-pay | Admitting: Pulmonary Disease

## 2021-05-15 DIAGNOSIS — G4733 Obstructive sleep apnea (adult) (pediatric): Secondary | ICD-10-CM

## 2021-05-15 NOTE — Telephone Encounter (Signed)
CPAP 10 cm required med FF mask during study AutoCPAP Rx was already sent during last OV , hope he has received by now Pl schedule OV 6 weeks after starting with APP/ me

## 2021-05-15 NOTE — Procedures (Signed)
Patient Name: Samuel Ewing, Grape Date: 05/12/2021 Gender: Male D.O.B: January 09, 1971 Age (years): 58 Referring Provider: Cyril Mourning MD, ABSM Height (inches): 70 Interpreting Physician: Cyril Mourning MD, ABSM Weight (lbs): 150 RPSGT: Rosette Reveal BMI: 22 MRN: [id number] Neck Size: 15.00 <br> <br> CLINICAL INFORMATION The patient is referred for a CPAP titration to treat sleep apnea.    Date of HST: 01/2021 showed moderate  OSA with AHI 20/ hr Significant oxygen desaturations were noted even in the absence of apneas or hypopneas suggesting underlying cardiopulmonary disease 01/2021 Nocturnal oximetry on RA  showed 3hr satn < 88%  SLEEP STUDY TECHNIQUE As per the AASM Manual for the Scoring of Sleep and Associated Events v2.3 (April 2016) with a hypopnea requiring 4% desaturations.  The channels recorded and monitored were frontal, central and occipital EEG, electrooculogram (EOG), submentalis EMG (chin), nasal and oral airflow, thoracic and abdominal wall motion, anterior tibialis EMG, snore microphone, electrocardiogram, and pulse oximetry. Continuous positive airway pressure (CPAP) was initiated at the beginning of the study and titrated to treat sleep-disordered breathing.  MEDICATIONS Medications self-administered by patient taken the night of the study : DIAZEPAM, MELATONIN  TECHNICIAN COMMENTS Comments added by technician: Patient was restless all through the night. Comments added by scorer: N/A  RESPIRATORY PARAMETERS Optimal PAP Pressure (cm): 10 AHI at Optimal Pressure (/hr): 0 Overall Minimal O2 (%): 93.0 Supine % at Optimal Pressure (%): 0 Minimal O2 at Optimal Pressure (%): 93.0   SLEEP ARCHITECTURE The study was initiated at 10:56:40 PM and ended at 4:58:49 AM.  Sleep onset time was 19.4 minutes and the sleep efficiency was 61.2%%. The total sleep time was 221.5 minutes.  The patient spent 7.7%% of the night in stage N1 sleep, 75.6%% in stage N2 sleep,  0.0%% in stage N3 and 16.7% in REM.Stage REM latency was 126.5 minutes  Wake after sleep onset was 121.3. Alpha intrusion was absent. Supine sleep was 0.00%.  CARDIAC DATA The 2 lead EKG demonstrated sinus rhythm. The mean heart rate was 57.4 beats per minute. Other EKG findings include: None.  LEG MOVEMENT DATA The total Periodic Limb Movements of Sleep (PLMS) were 0. The PLMS index was 0.0. A PLMS index of <15 is considered normal in adults.  IMPRESSIONS - The optimal PAP pressure was 10 cm of water. - Significant oxygen desaturations were not observed during this titration (min O2 = 93.0%). No oxygen was required - The patient snored with soft snoring volume during this titration study. - No cardiac abnormalities were observed during this study. - Clinically significant periodic limb movements were not noted during this study. Arousals associated with PLMs were rare.   DIAGNOSIS - Obstructive Sleep Apnea (G47.33)   RECOMMENDATIONS - Trial of CPAP therapy on 10 cm H2O with a Small-Medium size Fisher&Paykel Full Face Mask Evora Full Mask mask and heated humidification. - Avoid alcohol, sedatives and other CNS depressants that may worsen sleep apnea and disrupt normal sleep architecture. - Sleep hygiene should be reviewed to assess factors that may improve sleep quality. - Weight management and regular exercise should be initiated or continued. - Return to Sleep Center for re-evaluation after 4 weeks of therapy   Cyril Mourning MD Board Certified in Sleep medicine

## 2021-05-15 NOTE — Progress Notes (Signed)
   THERAPIST PROGRESS NOTE Virtual Visit via Video Note  I connected with Rusell Meneely Sick on 05/15/21 at  9:00 AM EST by a video enabled telemedicine application and verified that I am speaking with the correct person using two identifiers.  Location: Patient: home Provider: office   I discussed the limitations of evaluation and management by telemedicine and the availability of in person appointments. The patient expressed understanding and agreed to proceed.   Follow Up Instructions: I discussed the assessment and treatment plan with the patient. The patient was provided an opportunity to ask questions and all were answered. The patient agreed with the plan and demonstrated an understanding of the instructions.   The patient was advised to call back or seek an in-person evaluation if the symptoms worsen or if the condition fails to improve as anticipated.   Session Time: 25 minutes  Participation Level: Active  Behavioral Response: CasualAlertAnxious  Type of Therapy: Individual Therapy  Treatment Goals addressed: Anxiety  Interventions: CBT and Supportive  Summary:  ZAKK BORGEN is a 50 y.o. male who presents for the scheduled session oriented x5, appropriately dressed, and friendly.  Client denied hallucinations and delusions. Client reported on today that he has been managing fairly well but still having intense feelings of anxiety.  Client reported he has been to decrease his dosage of pain medication.  Client reported he is now taking 2 mg 3 times a day.  Client reported he has anxiety waiting for his next dose of pain medications.  Client reported since the decrease he has been having symptoms of cravings for medication but substituting it with food.  Client reported he is trying to not eat that because of the cravings.  Client reported he continues to have trouble staying asleep.  Client reported last night he probably started about an hour.  Client reported he has  spoken with his primary care physician and they have completed a sleep study recently.  Client reported he will be going to CPAP machine this coming Friday.  Client reported he has continued to try implementing.  Distress tolerance and coping skills that were previously discussed.  Client reported he had not been effective or minimally effective.  Client reported he thinks the changes in medications has caused more anxiety.  Client reported continues to go to work but experiences panic attack symptoms at work.   Suicidal/Homicidal: Nowithout intent/plan  Therapist Response:  Therapist began the appointment asking client how he has been doing since last seen. Therapist used CBT to utilize active listening and positive emotional support towards his thoughts and feelings. Therapist used CBT to inquire about the clients use of Coping skills to help alleviate physical symptoms of anxiety. Therapist used CBT to ask the client about his adjustment to medications and is sleeping better. Therapist assigned client homework to continue practicing the Coping skills and reinforce the use of his CPAP machine to improve his quality of sleep. Client was scheduled for next appointment.     Plan: Return again in 5 weeks.  Diagnosis: Generalized anxiety disorder   Neena Rhymes Bricia Taher, LCSW 05/15/2021

## 2021-05-18 NOTE — Telephone Encounter (Signed)
I spoke with the pt and notified of results  He has his CPAP and is using this on auto 5-15 with 2lpm o2 bled in  Do you want to change order to set pressure of 10cm? Does he need to continue o2? I scheduled him for appt for 06/30/21

## 2021-05-19 ENCOUNTER — Other Ambulatory Visit (HOSPITAL_COMMUNITY): Payer: Self-pay | Admitting: Psychiatry

## 2021-05-19 DIAGNOSIS — F411 Generalized anxiety disorder: Secondary | ICD-10-CM

## 2021-05-28 NOTE — Telephone Encounter (Signed)
Oretha Milch, MD to Me     4:38 PM Okay to continue auto settings.  No oxygen was required so he can discontinue oxygen   Pt aware  Nothing further needed Order sent to [facility] to d/c o2

## 2021-06-12 ENCOUNTER — Other Ambulatory Visit (HOSPITAL_COMMUNITY): Payer: Self-pay | Admitting: Psychiatry

## 2021-06-12 DIAGNOSIS — F411 Generalized anxiety disorder: Secondary | ICD-10-CM

## 2021-06-16 ENCOUNTER — Encounter (HOSPITAL_COMMUNITY): Payer: Self-pay | Admitting: Psychiatry

## 2021-06-16 ENCOUNTER — Telehealth (INDEPENDENT_AMBULATORY_CARE_PROVIDER_SITE_OTHER): Payer: 59 | Admitting: Psychiatry

## 2021-06-16 DIAGNOSIS — F3130 Bipolar disorder, current episode depressed, mild or moderate severity, unspecified: Secondary | ICD-10-CM | POA: Diagnosis not present

## 2021-06-16 DIAGNOSIS — F411 Generalized anxiety disorder: Secondary | ICD-10-CM | POA: Diagnosis not present

## 2021-06-16 MED ORDER — PROPRANOLOL HCL 10 MG PO TABS
10.0000 mg | ORAL_TABLET | Freq: Three times a day (TID) | ORAL | 3 refills | Status: DC
Start: 2021-06-16 — End: 2021-08-03

## 2021-06-16 MED ORDER — DULOXETINE HCL 60 MG PO CPEP: 60.0000 mg | ORAL_CAPSULE | Freq: Two times a day (BID) | ORAL | 3 refills | Status: DC

## 2021-06-16 MED ORDER — ZIPRASIDONE HCL 60 MG PO CAPS: 60.0000 mg | ORAL_CAPSULE | Freq: Two times a day (BID) | ORAL | 3 refills | Status: DC

## 2021-06-16 MED ORDER — BUSPIRONE HCL 30 MG PO TABS: 30.0000 mg | ORAL_TABLET | Freq: Two times a day (BID) | ORAL | 3 refills | Status: DC

## 2021-06-16 NOTE — Progress Notes (Signed)
BH MD/PA/NP OP Progress Note Virtual Visit via Video Note  I connected with Samuel Ewing on 06/16/21 at 11:30 AM EST by a video enabled telemedicine application and verified that I am speaking with the correct person using two identifiers.  Location: Patient: Home Provider: Clinic   I discussed the limitations of evaluation and management by telemedicine and the availability of in person appointments. The patient expressed understanding and agreed to proceed.  I provided 30 minutes of non-face-to-face time during this encounter.        06/16/2021 1:38 PM Samuel Ewing  MRN:  [id number]  Chief Complaint: "I am not doing well. I have been having terrible underlying anxiety""  HPI: 50 year old male seen today for follow up psychiatric evaluation.  He has a psychiatric history of anxiety and depression. He is currently managed on BuSpar 30 mg twice daily, Cymbalta 60 mg twice daily, hydroxyzine 50 mg three times daily, propanolol 10 mg 3 times daily, Valium 5 mg once daily, and Geodon 40 twice daily (which he notes he takes for anxiety).  He also reports that he receives gabapentin 200 mg twice daily (receives from pain management).  He notes that his medications are somewhat effective in managing his psychiatric conditions.   Today patient was well-groomed, pleasant, cooperative, and engaged in conversation.  He informed Probation officer that he has not been doing well.  He notes that he has underlying anxiety which has become bothersome.  He notes that he is unaware of what he is worried about.  Patient notes that therapy has been ineffective.  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  Provider informed patient that after being on Valium for a long period of time anxiety can worsen.  Provider encouraged patient to cut his Valium dose in half as it will be reduced at next visit.  He endorsed understanding and agreed.  Today  patient endorses symptoms of hypomania.  He notes that he is irritable, distractible, has racing thoughts, impulsive spending on online gaming, and fluctuations in mood.  He informed Probation officer that when he was younger he would feel euphoric, impulsively spend money, and have inappropriate sexual relationships with individuals.  He describes his past state is being manic-like.  Today he endorses passive SI however notes that he does not want to harm himself.  He denies SI/HI/AVH or paranoia.  Patient informed Probation officer that his anxiety and depression continues to be bothersome.  Provider conducted a GAD-7 and patient scored a 16, at his last visit he scored an 18.  Provider also conducted a PHQ-9 and patient scored a 24, at his last visit he scored a 22.  He notes that his sleep fluctuates noting that last night he only slept 2 hours.  Patient notes that he has gone periods of time where he has only slept 72 hours and a 6-day period.  He endorses adequate appetite.    Patient informed Probation officer that he continues to be in some pain.  He however informed Probation officer that his Dilaudid was reduced to 2 mg 3 times daily.  He also notes that he takes gabapentin 200 mg twice daily to help manage pain.  Today patient agreeable to increasing Geodon 40 mg twice daily daily to 60 mg twice daily to help manage mood.  Provider discussed potentially starting patient on Lamictal at next visit or another antipsychotic such as Risperdal Vraylar, or Seroquel if his mood has not  stabilized.  He endorsed understanding and agreed.  Provider provider informed patient that propanolol (which he reports he finds effective) cannot increase at this time because he is currently on 2 antihypertensive for his blood pressures (which has been running low).  Provider asked patient when he will see his primary care doctor.  He notes that he will see her in January.  Provider recommended patient discuss a with PCP reduction one of his other antihypertensive  medication and increasing propranolol to help manage anxiety.  Provider also discussed increasing gabapentin 200 mg twice daily to 300 mg 3 times daily to help manage anxiety.  He endorsed understanding and reports that he will talk to his.  Patient  notes that he will discuss these recommendation with his pain specialist  as well as his primary care doctor.  Patient will follow-up with outpatient counseling for therapy.  Valium not refilled today it was recently refilled.  Provider informed patient that at his next visit Valium will be reduced.  No other concerns at this time. ° ° ° °Visit Diagnosis:  °  ICD-10-CM   °1. Bipolar I disorder, most recent episode depressed (HCC)  F31.30 ziprasidone (GEODON) 60 MG capsule  °  propranolol (INDERAL) 10 MG tablet  °  °2. Generalized anxiety disorder  F41.1 DULoxetine (CYMBALTA) 60 MG capsule  °  ziprasidone (GEODON) 60 MG capsule  °  propranolol (INDERAL) 10 MG tablet  °  busPIRone (BUSPAR) 30 MG tablet  °  ° ° °Past Psychiatric History: Anxiety and depression ° °Past Medical History:  °Past Medical History:  °Diagnosis Date  ° Anxiety   ° Depression   ° Hypertension   ° Migraine   ° Opiate dependence, continuous (HCC)   ° Opiate withdrawal (HCC)   ° Pneumonia 11/14/2020  °  °Past Surgical History:  °Procedure Laterality Date  ° BACK SURGERY    ° CERVICAL SPINE SURGERY    ° ° °Family Psychiatric History:  Paternal grandmother depression ° °Family History:  °Family History  °Problem Relation Age of Onset  ° Heart failure Mother   ° Heart failure Father   ° Heart attack Father   ° ° °Social History:  °Social History  ° °Socioeconomic History  ° Marital status: Single  °  Spouse name: Not on file  ° Number of children: Not on file  ° Years of education: Not on file  ° Highest education level: Not on file  °Occupational History  ° Not on file  °Tobacco Use  ° Smoking status: Former  °  Types: Cigarettes, E-cigarettes  °  Start date: 1986  °  Quit date: 03/17/2018  °  Years  since quitting: 3.2  ° Smokeless tobacco: Never  ° Tobacco comments:  °  Smoked 2-3 cigarettes per day when smoking.  °  Currently vaping 1 cartridge per 2 days.  °Vaping Use  ° Vaping Use: Every day  ° Start date: 06/28/2017  ° Substances: Nicotine  °Substance and Sexual Activity  ° Alcohol use: Never  ° Drug use: Yes  °  Types: Marijuana  ° Sexual activity: Not on file  °Other Topics Concern  ° Not on file  °Social History Narrative  ° Not on file  ° °Social Determinants of Health  ° °Financial Resource Strain: Not on file  °Food Insecurity: Not on file  °Transportation Needs: Not on file  °Physical Activity: Not on file  °Stress: Not on file  °Social Connections: Not on file  ° ° °Allergies:  °  Allergies  °Allergen Reactions  ° Morphine And Related   ° ° °Metabolic Disorder Labs: °Lab Results  °Component Value Date  ° HGBA1C 6.4 (H) 10/19/2020  ° MPG 136.98 10/19/2020  ° °No results found for: PROLACTIN °Lab Results  °Component Value Date  ° CHOL 120 06/18/2020  ° TRIG 101 06/18/2020  ° HDL 42 06/18/2020  ° CHOLHDL 2.9 06/18/2020  ° LDLCALC 59 06/18/2020  ° °Lab Results  °Component Value Date  ° TSH 0.431 10/20/2020  ° TSH 1.770 06/18/2020  ° ° °Therapeutic Level Labs: °No results found for: LITHIUM °No results found for: VALPROATE °No components found for:  CBMZ ° °Current Medications: °Current Outpatient Medications  °Medication Sig Dispense Refill  ° Accu-Chek Softclix Lancets lancets Use as instructed 100 each 12  ° albuterol (VENTOLIN HFA) 108 (90 Base) MCG/ACT inhaler Inhale 2 puffs into the lungs every 4 (four) hours as needed for wheezing or shortness of breath. 8 g 2  ° amLODipine (NORVASC) 10 MG tablet Take 1 tablet (10 mg total) by mouth daily. 90 tablet 1  ° atorvastatin (LIPITOR) 20 MG tablet Take 1 tablet (20 mg total) by mouth daily. 90 tablet 1  ° Blood Glucose Monitoring Suppl (ACCU-CHEK GUIDE ME) w/Device KIT Check BS in Q a.m before breakfast 1 kit 0  ° busPIRone (BUSPAR) 30 MG tablet Take 1  tablet (30 mg total) by mouth 2 (two) times daily. 60 tablet 3  ° diazepam (VALIUM) 5 MG tablet Take 1 tablet by mouth once daily 30 tablet 0  ° DULoxetine (CYMBALTA) 60 MG capsule Take 1 capsule (60 mg total) by mouth 2 (two) times daily. 60 capsule 3  ° HYDROmorphone (DILAUDID) 4 MG tablet Take 8 mg by mouth every 6 (six) hours as needed for severe pain.    ° influenza vac split quadrivalent PF (FLUARIX) 0.5 ML injection Inject into the muscle. 0.5 mL 0  ° levothyroxine (SYNTHROID) 50 MCG tablet Take 1 tablet (50 mcg total) by mouth daily. 90 tablet 1  ° lidocaine (LIDODERM) 5 % Place 1 patch onto the skin daily. Remove & Discard patch within 12 hours or as directed by MD 30 patch 0  ° lisinopril (ZESTRIL) 20 MG tablet Take 1 tablet (20 mg total) by mouth daily. 90 tablet 1  ° promethazine (PHENERGAN) 12.5 MG tablet Take 12.5 mg by mouth every 6 (six) hours as needed for nausea or vomiting.    ° propranolol (INDERAL) 10 MG tablet Take 1 tablet (10 mg total) by mouth 3 (three) times daily. 90 tablet 3  ° SUMAtriptan (IMITREX) 100 MG tablet Take 100 mg by mouth daily as needed for migraine. May repeat in 2 hours if headache persists or recurs.    ° ziprasidone (GEODON) 60 MG capsule Take 1 capsule (60 mg total) by mouth 2 (two) times daily with a meal. 60 capsule 3  ° °No current facility-administered medications for this visit.  ° ° ° °Musculoskeletal: °Strength & Muscle Tone:  Unable to assess due to telehealth visit °Gait & Station:  Unable to assess due to telehealth visit °Patient leans: N/A ° °Psychiatric Specialty Exam: °Review of Systems  °There were no vitals taken for this visit.There is no height or weight on file to calculate BMI.  °General Appearance: Well Groomed  °Eye Contact:  Good  °Speech:  Clear and Coherent and Normal Rate  °Volume:  Normal  °Mood:  Anxious and Depressed  °Affect:  Appropriate and Congruent  °Thought Process:  Coherent, Goal   Directed and Linear  Orientation:  Full (Time, Place,  and Person)  Thought Content: WDL and Logical   Suicidal Thoughts:  Yes.  without intent/plan  Homicidal Thoughts:  No  Memory:  Immediate;   Good Recent;   Good Remote;   Good  Judgement:  Good  Insight:  Good  Psychomotor Activity:  Normal  Concentration:  Concentration: Good and Attention Span: Good  Recall:  Good  Fund of Knowledge: Good  Language: Good  Akathisia:  No  Handed:  Right  AIMS (if indicated): Right  Assets:  Communication Skills Desire for Improvement Financial Resources/Insurance Housing Social Support  ADL's:  Intact  Cognition: WNL  Sleep:  Poor   Screenings: GAD-7    Flowsheet Row Video Visit from 06/16/2021 in [facility] Video Visit from 03/11/2021 in [facility] Counselor from 12/18/2020 in [facility] Office Visit from 12/08/2020 in [place] Video Visit from 11/14/2020 in [facility]  Total GAD-7 Score _0 PHQ2-9    Flowsheet Row Video Visit from 06/16/2021 in [facility] Video Visit from 03/11/2021 in [facility] Counselor from 12/18/2020 in [facility] Office Visit from 12/08/2020 in [place] Video Visit from 11/14/2020 in [place]  PHQ-2 Total Score _1 PHQ-9 Total Score _2 Flowsheet Row Video Visit from 06/16/2021 in [facility] Video Visit from 03/11/2021 in [facility] ED from 02/18/2021 in [facility]  C-SSRS RISK CATEGORY Error: Q7 should not be populated when Q6 is No No Risk No Risk        Assessment and Plan: Patient notes that his anxiety, insomnia, hypomania, and depression has improved since his last visit. Today  patient agreeable to increasing Geodon 40 mg twice daily daily to 60 mg twice daily to help manage mood.  Provider discussed potentially starting patient on Lamictal at next visit or another antipsychotic such as Risperdal Vraylar, or Seroquel if his mood has not stabilized.  He endorsed understanding and agreed.  Provider provider informed patient that propanolol (which he reports he finds effective) cannot increase at this time because he is currently on 2 antihypertensive for his blood pressures (which has been running low).  Provider asked patient when he will see his primary care doctor.  He notes that he will see her in January.  Provider recommended patient discuss a with PCP reduction one of his other antihypertensive medication and increasing propranolol to help manage anxiety.  Provider also discussed increasing gabapentin 200 mg twice daily to 300 mg 3 times daily to help manage anxiety.  He endorsed understanding and reports that he will talk to his.  Patient  notes that he will discuss these recommendation with his pain specialist  as well as his primary care doctor.  Patient will follow-up with outpatient counseling for therapy.  Valium not refilled today it was recently refilled.  Provider informed patient that at his next visit Valium will be reduced.  1. Generalized anxiety disorder  Continue- DULoxetine (CYMBALTA) 60 MG capsule; Take 1 capsule (60 mg total) by mouth 2 (two) times daily.  Dispense: 60 capsule; Refill: 3 Increase- ziprasidone (GEODON) 60 MG capsule; Take 1 capsule (60 mg  total) by mouth 2 (two) times daily with a meal.  Dispense: 60 capsule; Refill: 3 °Continue- propranolol (INDERAL) 10 MG tablet; Take 1 tablet (10 mg total) by mouth 3 (three) times daily.  Dispense: 90 tablet; Refill: 3 °Continue- busPIRone (BUSPAR) 30 MG tablet; Take 1 tablet (30 mg total) by mouth 2 (two) times daily.  Dispense: 60 tablet; Refill: 3 ° °2. Bipolar I disorder, most recent episode depressed  (HCC) ° °Increase- ziprasidone (GEODON) 60 MG capsule; Take 1 capsule (60 mg total) by mouth 2 (two) times daily with a meal.  Dispense: 60 capsule; Refill: 3 °Continue- propranolol (INDERAL) 10 MG tablet; Take 1 tablet (10 mg total) by mouth 3 (three) times daily.  Dispense: 90 tablet; Refill: 3 ° ° °Follow up in 3 months °Follow-up with therapy ° ° E , NP °06/16/2021, 1:38 PM °

## 2021-06-30 ENCOUNTER — Ambulatory Visit (INDEPENDENT_AMBULATORY_CARE_PROVIDER_SITE_OTHER): Payer: 59 | Admitting: Pulmonary Disease

## 2021-06-30 ENCOUNTER — Other Ambulatory Visit: Payer: Self-pay

## 2021-06-30 ENCOUNTER — Encounter: Payer: Self-pay | Admitting: Pulmonary Disease

## 2021-06-30 DIAGNOSIS — G4734 Idiopathic sleep related nonobstructive alveolar hypoventilation: Secondary | ICD-10-CM | POA: Diagnosis not present

## 2021-06-30 DIAGNOSIS — G4733 Obstructive sleep apnea (adult) (pediatric): Secondary | ICD-10-CM | POA: Diagnosis not present

## 2021-06-30 NOTE — Progress Notes (Signed)
° °  Subjective:    Patient ID: Samuel Ewing, male    DOB: 07/21/1970, 51 y.o.   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   PMH -chronic neck pain on narcotics, severe anxiety on BuSpar, Geodon, diazepam and Cymbalta Hypertension Chronic headaches Hyperlipidemia. He is a Print production planner for [employer]. He had neck surgery [id number] which was complicated by aspiration pneumonia.  He was discharged on oxygen.  He was evaluated by my partner Dr. Valeta Harms, nocturnal oximetry showed persistent desaturation and nocturnal oxygen was continued.  Home sleep study showed moderate OSA with nocturnal hypoxia out of proportion and even in the absence of respiratory events   At his initial office visit with me 02/2021, we scheduled a CPAP titration study and set him up with auto CPAP  Chief Complaint  Patient presents with   Follow-up    Follow up for cpap machine. Pt states that he cant sleep with his cpap machine and is only sleeping for about 2-3 hrs a night. He states he thinks its the full face mask that might be the issue.    He has difficulties adjusting to full facemask.  He has longstanding insomnia for many years worse over the past year.  He states that he sleeps only 3 hours per night and after 5-6 nights he will crash and sleep the whole day.  He has tried 10 mg of melatonin without any relief He is on multiple medications including Valium, Dilaudid has decreased to 2 mg 4 times a day, Geodon.  He sees psychiatry for anxiety and bipolar. He has been unable to use his CPAP consistently for long periods of time, the full facemask is causing problems and he wonders if he will do better with a different mask   Significant tests/ events reviewed   04/2021 CPAP titration >> 10 cm, small FF mask   01/2021 HST showed moderate  OSA with AHI 20/ hr Significant oxygen desaturations were noted even in the absence of apneas or hypopneas suggesting  underlying cardiopulmonary disease 01/2021 ONO /RA   3hr 38min satn < 88%     CT angiogram chest 09/2020 multifocal pneumonia   Review of Systems neg for any significant sore throat, dysphagia, itching, sneezing, nasal congestion or excess/ purulent secretions, fever, chills, sweats, unintended wt loss, pleuritic or exertional cp, hempoptysis, orthopnea pnd or change in chronic leg swelling. Also denies presyncope, palpitations, heartburn, abdominal pain, nausea, vomiting, diarrhea or change in bowel or urinary habits, dysuria,hematuria, rash, arthralgias, visual complaints, headache, numbness weakness or ataxia.     Objective:   Physical Exam  Gen. Pleasant, well-nourished, in no distress ENT - no thrush, no pallor/icterus,no post nasal drip, overbite 42mm + Neck: No JVD, no thyromegaly, no carotid bruits Lungs: no use of accessory muscles, no dullness to percussion, clear without rales or rhonchi  Cardiovascular: Rhythm regular, heart sounds  normal, no murmurs or gallops, no peripheral edema Musculoskeletal: No deformities, no cyanosis or clubbing        Assessment & Plan:    Longstanding insomnia, complicated by anxiety and bipolar disorder -He is on multiple medications including Dilaudid, Valium and other medications for anxiety and bipolar.  Would defer to psychiatrist whether hypnotic can be added to his regimen or his regimen can be changed to provide him better sleep at night

## 2021-06-30 NOTE — Assessment & Plan Note (Addendum)
Obstructive events were controlled by CPAP of 10 cm.  He needs mask desensitization.  He is not tolerating full facemask well.  We will trial nasal mask with a chinstrap if needed and see if he tolerates this better  If he does not tolerate this mask, then he will contact his dentist for fitting for oral appliance.  This will take care of teeth grinding as well as OSA.  We will need to repeat his home sleep test while using dental appliance once he is able to comply I briefly discussed hypoglossal nerve stimulation implant but did not feel that he is ready for this yet

## 2021-06-30 NOTE — Assessment & Plan Note (Signed)
Nocturnal oxygen has been discontinued

## 2021-06-30 NOTE — Patient Instructions (Signed)
X prescription for nasal mask and chinstrap will be sent to [facility]  - contact your dentist for oral appliance to treat sleep apnea  - Contact your psychiatrist for insomnia medication

## 2021-07-07 ENCOUNTER — Ambulatory Visit (HOSPITAL_COMMUNITY): Payer: 59 | Admitting: Clinical

## 2021-07-17 ENCOUNTER — Other Ambulatory Visit (HOSPITAL_COMMUNITY): Payer: Self-pay | Admitting: Psychiatry

## 2021-07-17 DIAGNOSIS — F411 Generalized anxiety disorder: Secondary | ICD-10-CM

## 2021-07-31 ENCOUNTER — Other Ambulatory Visit: Payer: Self-pay

## 2021-07-31 ENCOUNTER — Emergency Department (HOSPITAL_BASED_OUTPATIENT_CLINIC_OR_DEPARTMENT_OTHER): Payer: 59 | Admitting: Radiology

## 2021-07-31 ENCOUNTER — Emergency Department (HOSPITAL_BASED_OUTPATIENT_CLINIC_OR_DEPARTMENT_OTHER)
Admission: EM | Admit: 2021-07-31 | Discharge: 2021-07-31 | Disposition: A | Discharge status: Home / Self Care | Payer: 59 | Attending: Emergency Medicine | Admitting: Emergency Medicine

## 2021-07-31 ENCOUNTER — Encounter (HOSPITAL_BASED_OUTPATIENT_CLINIC_OR_DEPARTMENT_OTHER): Payer: Self-pay | Admitting: *Deleted

## 2021-07-31 DIAGNOSIS — I10 Essential (primary) hypertension: Secondary | ICD-10-CM | POA: Insufficient documentation

## 2021-07-31 DIAGNOSIS — Z79899 Other long term (current) drug therapy: Secondary | ICD-10-CM | POA: Insufficient documentation

## 2021-07-31 DIAGNOSIS — F419 Anxiety disorder, unspecified: Secondary | ICD-10-CM | POA: Insufficient documentation

## 2021-07-31 LAB — BASIC METABOLIC PANEL
Anion gap: 9 (ref 5–15)
BUN: 19 mg/dL (ref 6–20)
CO2: 27 mmol/L (ref 22–32)
Calcium: 10.1 mg/dL (ref 8.9–10.3)
Chloride: 102 mmol/L (ref 98–111)
Creatinine, Ser: 0.86 mg/dL (ref 0.61–1.24)
GFR, Estimated: >60 mL/min (ref >60)
Glucose, Bld: 124 mg/dL — ABNORMAL HIGH (ref 70–99)
Potassium: 4.2 mmol/L (ref 3.5–5.1)
Sodium: 138 mmol/L (ref 135–145)

## 2021-07-31 LAB — CBC
HCT: 42.1 % (ref 39.0–52.0)
Hemoglobin: 14.4 g/dL (ref 13.0–17.0)
MCH: 31.6 pg (ref 26.0–34.0)
MCHC: 34.2 g/dL (ref 30.0–36.0)
MCV: 92.3 fL (ref 80.0–100.0)
Platelets: 248 10*3/uL (ref 150–400)
RBC: 4.56 MIL/uL (ref 4.22–5.81)
RDW: 11.3 % — ABNORMAL LOW (ref 11.5–15.5)
WBC: 7.2 10*3/uL (ref 4.0–10.5)
nRBC: 0 % (ref 0.0–0.2)

## 2021-07-31 LAB — TROPONIN I (HIGH SENSITIVITY): Troponin I (High Sensitivity): <2 ng/L (ref <18)

## 2021-07-31 IMAGING — DX DG CHEST 2V
2 series · 2 of 2 positions shown · non-contrast
Comparison: [DATE]

CLINICAL DATA: Chest pain.

EXAM:
CHEST - 2 VIEW

[chest pa]
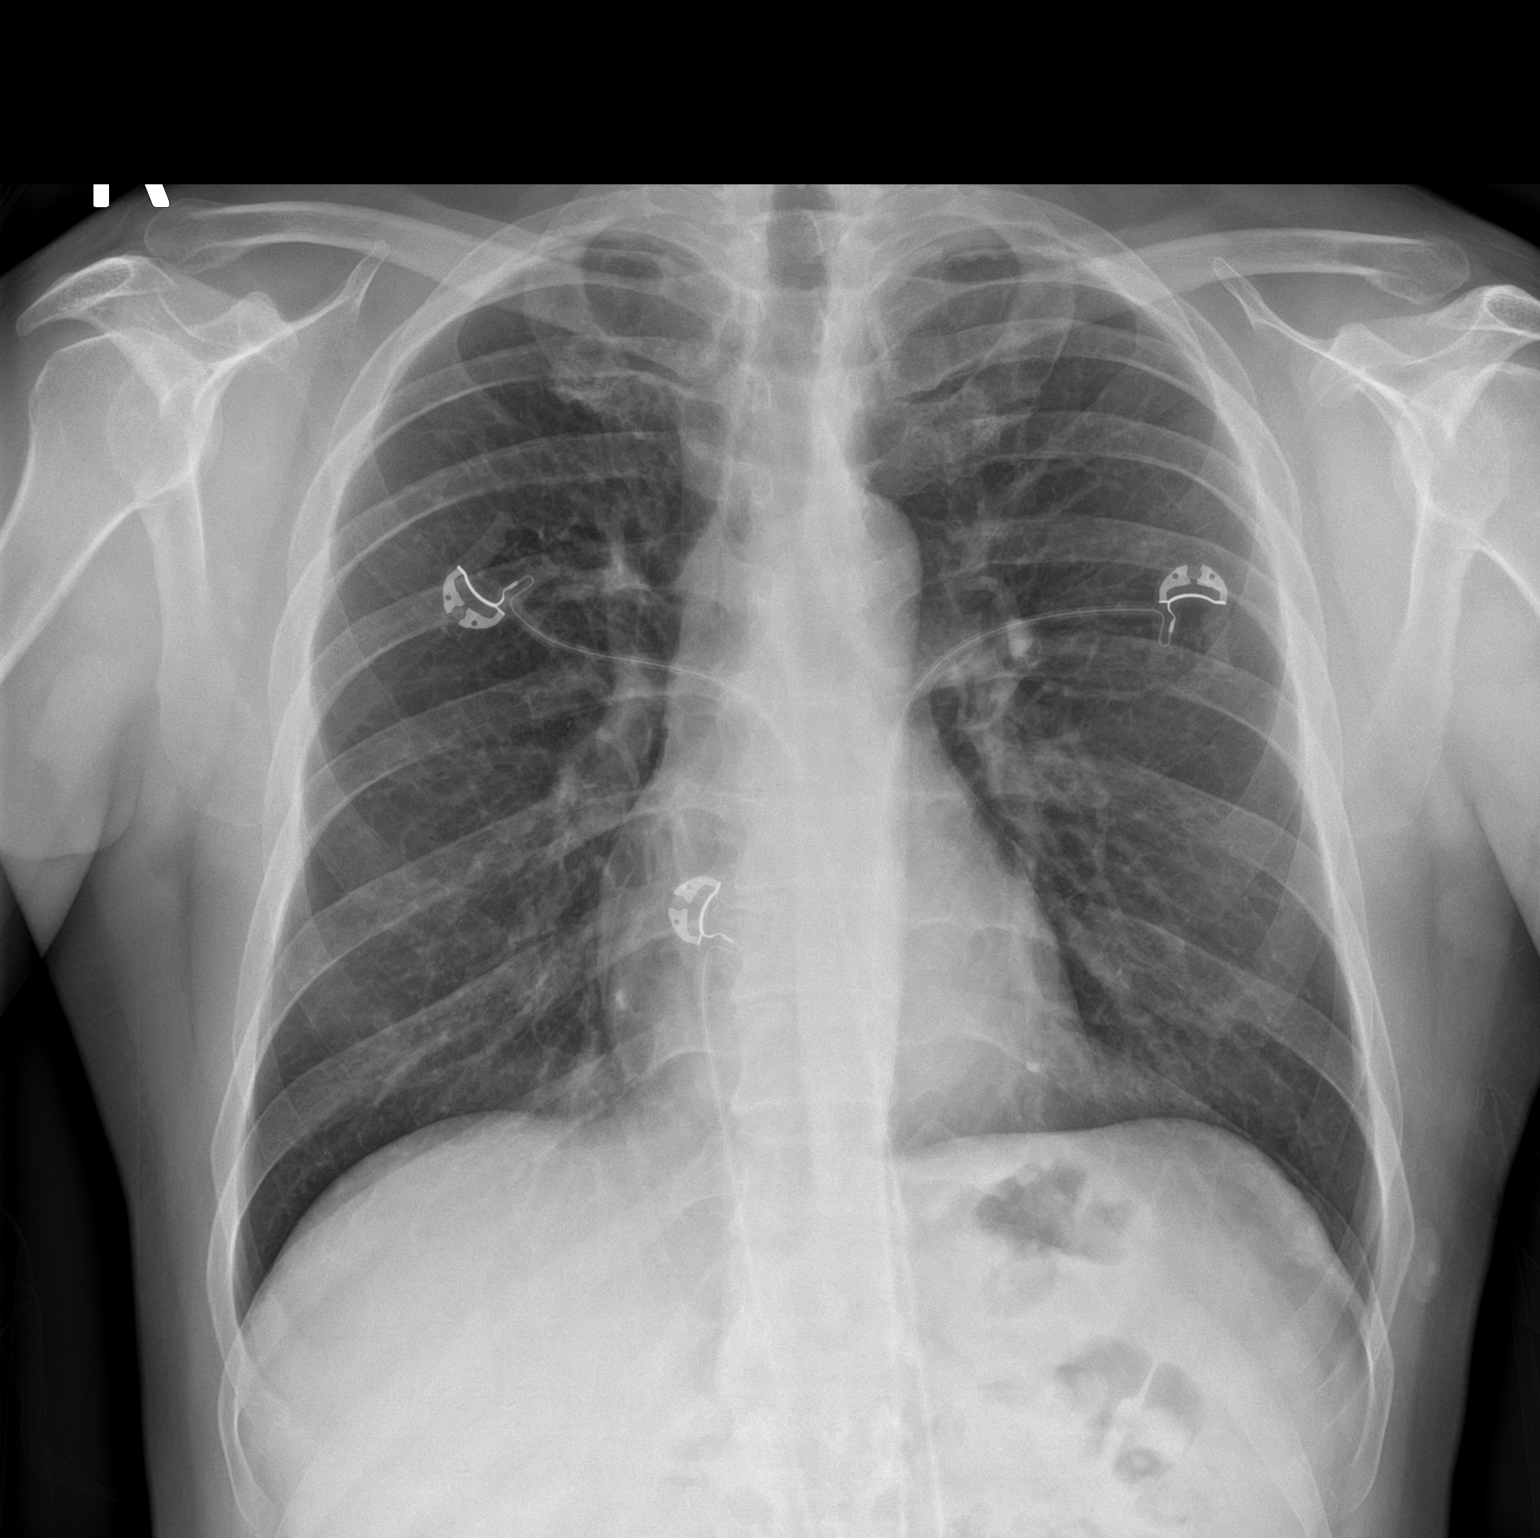

[chest lat]
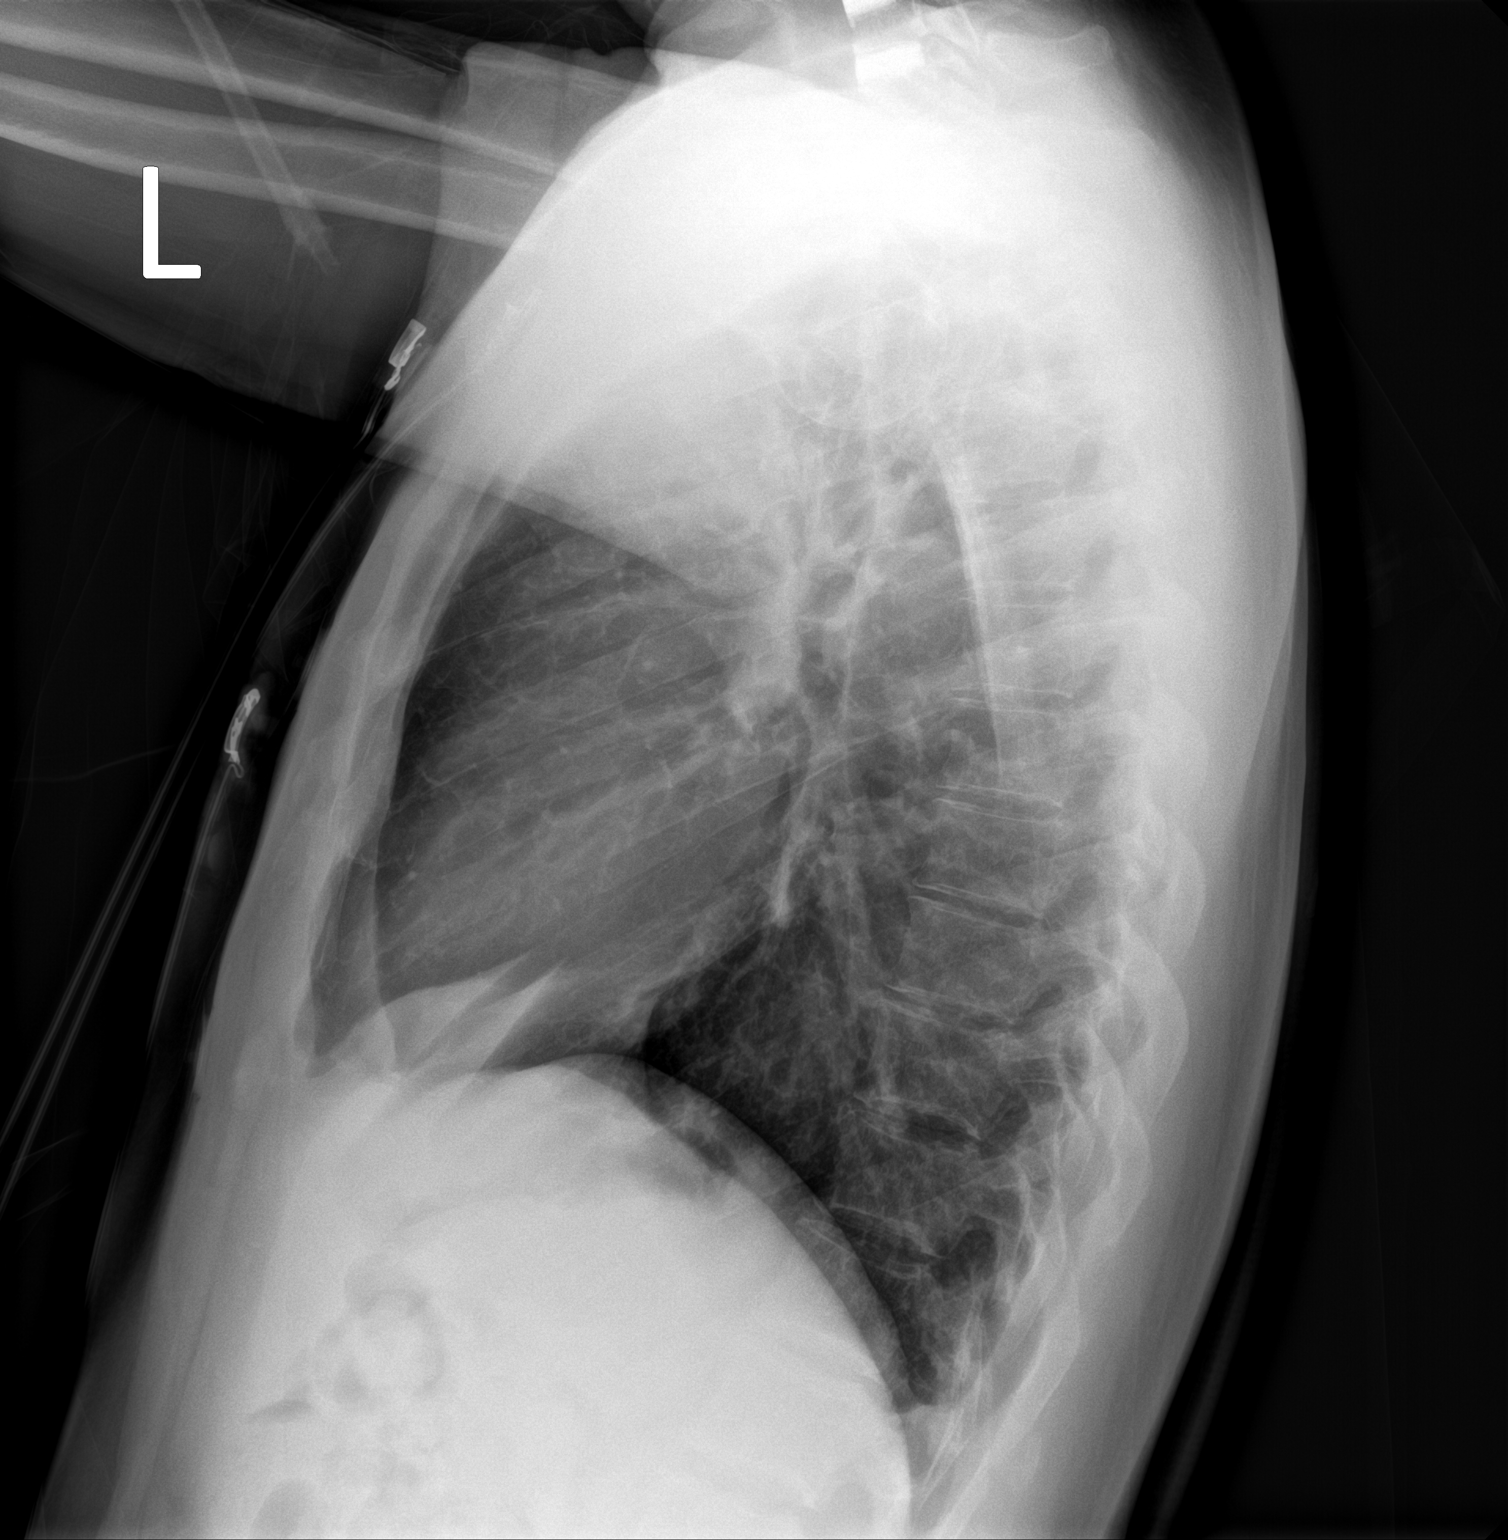

[2 of 2 positions shown; findings below may reference images not displayed]

FINDINGS: Surgicalthe cardiomediastinal contours are normal. The lungs are
clear. Pulmonary vasculature is normal. No consolidation, pleural
effusion, or pneumothorax. No acute osseous abnormalities are seen.
Hardware in the lower cervical spine is partially included.
IMPRESSION: No acute chest findings.

## 2021-07-31 MED ORDER — DIAZEPAM 5 MG/ML IJ SOLN
5.0000 mg | Freq: Once | INTRAMUSCULAR | Status: AC
  Administered 2021-07-31: 5 mg via INTRAVENOUS
  Filled 2021-07-31: qty 2

## 2021-07-31 NOTE — Discharge Instructions (Signed)
Your work-up today was overall reassuring.  Your heart enzymes and blood work were all normal.  Your EKG was normal.  Your symptoms are likely from your anxiety.  I strongly recommend that you use your prescribed Valium as needed when the situations arise.  I gave you the same dose of Valium here in the ED is the one that you have at home and you had no negative reactions.  Return if you develop worsening chest pain, numbness tingling, shortness of breath.  Otherwise please follow-up with your psychiatrist or PCP.

## 2021-07-31 NOTE — ED Triage Notes (Signed)
Pt is here for CP which began today around 1pm. Pt also reports hypertension (has been taking meds) for several days and anxiety (chronic)

## 2021-08-01 NOTE — ED Provider Notes (Signed)
[facility] Provider Note   CSN: [id number] Arrival date & time: 07/31/21  1639     History  Chief Complaint  Patient presents with   Chest Pain   Hypertension   Anxiety    Samuel Ewing is a 51 y.o. male with a history of hypertension, anxiety, migraines, depression who presents to the ED for evaluation of anxiety and chest pain.  Patient states he has a history of severe anxiety and endorses chest pain that started around 1 PM.  He notes is on the left side of his chest, described as pressure and seem to be rating down his left arm.  He states that he has a prescription for propanolol and Valium, but he is afraid to take the Valium I am concerned that we will affect his breathing in conjunction with his Geodon.  Patient denies abdominal pain, nausea, vomiting, diarrhea, leg swelling, diaphoresis.  No previous history of cardiac disease.   Chest Pain Associated symptoms: anxiety   Associated symptoms: no abdominal pain, no fever, no headache, no shortness of breath and no vomiting   Hypertension Associated symptoms include chest pain. Pertinent negatives include no abdominal pain, no headaches and no shortness of breath.  Anxiety Associated symptoms include chest pain. Pertinent negatives include no abdominal pain, no headaches and no shortness of breath.      Home Medications Prior to Admission medications   Medication Sig Start Date End Date Taking? Authorizing Provider  Accu-Chek Softclix Lancets lancets Use as instructed 12/08/20   Ladell Pier, MD  albuterol (VENTOLIN HFA) 108 (90 Base) MCG/ACT inhaler Inhale 2 puffs into the lungs every 4 (four) hours as needed for wheezing or shortness of breath. 10/24/20   Dwyane Dee, MD  amLODipine (NORVASC) 10 MG tablet Take 1 tablet (10 mg total) by mouth daily. 04/23/21   Ladell Pier, MD  atorvastatin (LIPITOR) 20 MG tablet Take 1 tablet (20 mg total) by mouth daily. 04/23/21   Ladell Pier, MD  Blood Glucose Monitoring Suppl (ACCU-CHEK GUIDE ME) w/Device KIT Check BS in Q a.m before breakfast 12/08/20   Ladell Pier, MD  busPIRone (BUSPAR) 30 MG tablet Take 1 tablet (30 mg total) by mouth 2 (two) times daily. 06/16/21   Salley Slaughter, NP  diazepam (VALIUM) 5 MG tablet Take 1 tablet by mouth once daily 07/20/21   Eulis Canner E, NP  DULoxetine (CYMBALTA) 60 MG capsule Take 1 capsule (60 mg total) by mouth 2 (two) times daily. 06/16/21   Salley Slaughter, NP  HYDROmorphone (DILAUDID) 4 MG tablet Take 8 mg by mouth every 6 (six) hours as needed for severe pain. 09/22/20   [provider]  influenza vac split quadrivalent PF (FLUARIX) 0.5 ML injection Inject into the muscle. 05/12/21   Carlyle Basques, MD  levothyroxine (SYNTHROID) 50 MCG tablet Take 1 tablet (50 mcg total) by mouth daily. 04/23/21   Ladell Pier, MD  lidocaine (LIDODERM) 5 % Place 1 patch onto the skin daily. Remove & Discard patch within 12 hours or as directed by MD 08/17/20   Caccavale, Sophia, PA-C  lisinopril (ZESTRIL) 20 MG tablet Take 1 tablet (20 mg total) by mouth daily. 04/23/21   Ladell Pier, MD  promethazine (PHENERGAN) 12.5 MG tablet Take 12.5 mg by mouth every 6 (six) hours as needed for nausea or vomiting. 09/12/20   [provider]  propranolol (INDERAL) 10 MG tablet Take 1 tablet (10 mg total) by mouth  3 (three) times daily. 06/16/21   Salley Slaughter, NP  SUMAtriptan (IMITREX) 100 MG tablet Take 100 mg by mouth daily as needed for migraine. May repeat in 2 hours if headache persists or recurs.    [provider]  ziprasidone (GEODON) 60 MG capsule Take 1 capsule (60 mg total) by mouth 2 (two) times daily with a meal. 06/16/21   Salley Slaughter, NP      Allergies    Morphine and related    Review of Systems   Review of Systems  Constitutional:  Negative for fever.  HENT: Negative.    Eyes: Negative.   Respiratory:  Negative  for shortness of breath.   Cardiovascular:  Positive for chest pain.  Gastrointestinal:  Negative for abdominal pain and vomiting.  Endocrine: Negative.   Genitourinary: Negative.   Musculoskeletal: Negative.   Skin:  Negative for rash.  Neurological:  Negative for headaches.  All other systems reviewed and are negative.  Physical Exam Updated Vital Signs BP (!) 139/96    Pulse 84    Temp 99.3 F (37.4 C)    Resp 16    SpO2 96%  Physical Exam Vitals and nursing note reviewed.  Constitutional:      General: He is not in acute distress.    Appearance: He is not ill-appearing.  HENT:     Head: Atraumatic.  Eyes:     Conjunctiva/sclera: Conjunctivae normal.  Cardiovascular:     Rate and Rhythm: Normal rate and regular rhythm.     Pulses: Normal pulses.     Heart sounds: No murmur heard. Pulmonary:     Effort: Pulmonary effort is normal. No respiratory distress.     Breath sounds: Normal breath sounds.  Abdominal:     General: Abdomen is flat. There is no distension.     Palpations: Abdomen is soft.     Tenderness: There is no abdominal tenderness.  Musculoskeletal:        General: Normal range of motion.     Cervical back: Normal range of motion.  Skin:    General: Skin is warm and dry.     Capillary Refill: Capillary refill takes less than 2 seconds.  Neurological:     General: No focal deficit present.     Mental Status: He is alert.  Psychiatric:        Mood and Affect: Mood normal.    ED Results / Procedures / Treatments   Labs (all labs ordered are listed, but only abnormal results are displayed) Labs Reviewed  BASIC METABOLIC PANEL - Abnormal; Notable for the following components:      Result Value   Glucose, Bld 124 (*)    All other components within normal limits  CBC - Abnormal; Notable for the following components:   RDW 11.3 (*)    All other components within normal limits  TROPONIN I (HIGH SENSITIVITY)    EKG EKG  Interpretation  Date/Time:  Friday July 31 2021 16:52:20 EST Ventricular Rate:  81 PR Interval:  188 QRS Duration: 118 QT Interval:  374 QTC Calculation: 434 R Axis:   6 Text Interpretation: Normal sinus rhythm Non-specific intra-ventricular conduction delay Borderline ECG no acute ST/T changes Confirmed by Sherwood Gambler [phone]) on 07/31/2021 5:28:37 PM  Radiology DG Chest 2 View  Result Date: 07/31/2021 CLINICAL DATA:  Chest pain. EXAM: CHEST - 2 VIEW COMPARISON:  02/18/2021 FINDINGS: Surgicalthe cardiomediastinal contours are normal. The lungs are clear. Pulmonary vasculature is normal. No consolidation,  pleural effusion, or pneumothorax. No acute osseous abnormalities are seen. Hardware in the lower cervical spine is partially included. IMPRESSION: No acute chest findings. Electronically Signed   By: Keith Rake M.D.   On: 07/31/2021 17:33    Procedures Procedures    Medications Ordered in ED Medications  diazepam (VALIUM) injection 5 mg (5 mg Intravenous Given 07/31/21 1809)    ED Course/ Medical Decision Making/ A&P                           Medical Decision Making Amount and/or Complexity of Data Reviewed Labs: ordered. Radiology: ordered.  Risk Prescription drug management.   History:  Per HPI Additional history obtained from mother This patient presents to the ED for concern of chest pain, this involves an extensive number of treatment options, and is a complaint that carries with it a high risk of complications and morbidity.    I do not believe the patient has an emergent cause of chest pain, urgent/non-acute considerations include, but are not limited to: chronic angina, aortic stenosis, cardiomyopathy, myocarditis, mitral valve prolapse, pulmonary hypertension, hypertrophic obstructive cardiomyopathy (HOCM), aortic insufficiency, right ventricular hypertrophy, pneumonia, pleuritis, bronchitis, pneumothorax, tumor, gastroesophageal reflux disease (GERD),  esophageal spasm, Mallory-Weiss syndrome, peptic ulcer disease, biliary disease, pancreatitis, functional gastrointestinal pain, cervical or thoracic disk disease or arthritis, shoulder arthritis, costochondritis, subacromial bursitis, anxiety or panic attack, herpes zoster, breast disorders, chest wall tumors, thoracic outlet syndrome, mediastinitis. Pain  Initial impression:  Patient is lying on bed, anxious.  No acute distress, nontoxic-appearing.  Patient believes that this is due to his anxiety, but he feels that he is nervous to take his anxiety meds out of potential dangerous interactions.  At time of evaluation the work-up from triage had returned with normal troponin, normal CBC, BMP.  Chest x-ray without acute abnormality.  EKG normal.  I agree that his symptoms are likely anxiety related.  I will trial a dose of 5 mg Valium here in the ED so that he can be reassured and monitored for any effects. Per RN, as soon as she post Valium, patient states "I am cured".  Patient's anxiety and chest pain have resolved.  He wishes to go home.   Cardiac Monitoring:  The patient was maintained on a cardiac monitor.  I personally viewed and interpreted the cardiac monitored which showed an underlying rhythm of: NSR   Medicines ordered and prescription drug management:  I ordered medication including: Valium 3m  for anxiety  Reevaluation of the patient after these medicines showed that the patient resolved I have reviewed the patients home medicines and have made adjustments as needed    Disposition:  After consideration of the diagnostic results, physical exam, history and the patients response to treatment feel that the patent would benefit from discharge with strict return precaution.   Anxiety: Patient's work-up and physical exam are overall reassuring.  His chest pain is most likely related to anxiety.  Symptoms resolved after administration of 5 mg Valium.  We discussed return precautions.   Encourage patient to use his anxiety medication at home as needed and assured him that the risk of side effect is minimal.  Patient expresses understanding and is amenable to plan.  Discharged home in stable condition.    Final Clinical Impression(s) / ED Diagnoses Final diagnoses:  Anxiety    Rx / DC Orders ED Discharge Orders     None  Tonye Pearson, [place] 08/01/21 1316    Sherwood Gambler, MD 08/03/21 1001

## 2021-08-03 ENCOUNTER — Other Ambulatory Visit: Payer: Self-pay

## 2021-08-03 ENCOUNTER — Ambulatory Visit: Payer: 59 | Attending: Internal Medicine | Admitting: Internal Medicine

## 2021-08-03 ENCOUNTER — Encounter: Payer: Self-pay | Admitting: Internal Medicine

## 2021-08-03 VITALS — BP 143/86 | HR 75 | Resp 16 | Wt 169.2 lb

## 2021-08-03 DIAGNOSIS — Z23 Encounter for immunization: Secondary | ICD-10-CM | POA: Diagnosis not present

## 2021-08-03 DIAGNOSIS — E039 Hypothyroidism, unspecified: Secondary | ICD-10-CM | POA: Diagnosis not present

## 2021-08-03 DIAGNOSIS — E785 Hyperlipidemia, unspecified: Secondary | ICD-10-CM

## 2021-08-03 DIAGNOSIS — G8929 Other chronic pain: Secondary | ICD-10-CM

## 2021-08-03 DIAGNOSIS — R7303 Prediabetes: Secondary | ICD-10-CM

## 2021-08-03 DIAGNOSIS — F3181 Bipolar II disorder: Secondary | ICD-10-CM

## 2021-08-03 DIAGNOSIS — M542 Cervicalgia: Secondary | ICD-10-CM

## 2021-08-03 DIAGNOSIS — I10 Essential (primary) hypertension: Secondary | ICD-10-CM

## 2021-08-03 DIAGNOSIS — F411 Generalized anxiety disorder: Secondary | ICD-10-CM | POA: Diagnosis not present

## 2021-08-03 DIAGNOSIS — L84 Corns and callosities: Secondary | ICD-10-CM

## 2021-08-03 MED ORDER — PROPRANOLOL HCL 20 MG PO TABS: 20.0000 mg | ORAL_TABLET | Freq: Three times a day (TID) | ORAL | 4 refills | Status: DC

## 2021-08-03 NOTE — Patient Instructions (Signed)
Please sign a release at the front desk for [place] to get your colonoscopy report.  Increase propranolol to 20 mg 3 times a day.  Please let me know if your pulse rate decreases below 60.  Continue to monitor blood pressure with goal being 130/80 or lower.  Follow-up with clinical pharmacist in 2 weeks for repeat blood pressure check.

## 2021-08-03 NOTE — Progress Notes (Signed)
Patient ID: Samuel Ewing, male    DOB: Oct 28, 1970  MRN: [id number]  CC: Hypertension and Hospitalization Follow-up (ED)   Subjective: Samuel Ewing is a 51 y.o. male who presents for chronic disease management. 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  Work-up including cardiac enzymes, chest x-ray and EKG without acute findings.  Chest pain thought to be due to anxiety.  Patient was wondering whether he still needs to have a repeat chest x-ray done.  X-ray that was done in August 2022 revealed a 0.8 cm round nodular opacity projecting over the lateral right lower lobe.  Radiologist stated that it could represent a nipple shadow versus a pulmonary nodule.  Since then, patient had PA and lateral chest x-ray done on the third of this month through the emergency room.  Chest x-ray was normal.  HTN:  Checking BP 2-3x/day.  He brings in his log with him.  Range 140-160/79-90 Compliant with meds that include amlodipine, propranolol 10 mg 3 times a day and lisinopril.  He limits salt in his foods. Loss 25 lbs since April of last yr after having neck surgery.  Weight loss at that time was unintentional.  However he has been trying to maintain the weight loss by eating healthy.  He has prediabetes.   He exercises by walking on his treadmill daily for 20 minutes. Reports compliance with taking levothyroxine every day.  However he takes it when he takes all of his other medications.  He was supposed to come and have his TSH level checked after last telephone visit with me but forgot to do so.  He has developed painful callus on the medial aspect of both big toes.  He continues to be  followed by his behavioral health specialist.  He tells me that besides anxiety he was diagnosed with bipolar 2.  He is on Cymbalta, Valium, BuSpar and Geodon.  The Geodon was increased to 60 mg twice a day a few months ago.  He feels his anxiety plays a role in increasing his blood pressure.  He plans to talk with his mental health provider on his next visit about applying for disability because he is starting to have problems functioning at work secondary to anxiety.  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  In regards to his chronic neck pain postsurgery, patient states he has been weaned off Dilaudid and is now on Suboxone.  He has naloxone at home.  HM: Due for shingles vaccine.  Patient tells me he had colonoscopy done about 1 year ago.  No polyps removed.  He is agreeable to signing a release for [place] to get the records.  Patient Active Problem List   Diagnosis Date Noted   Bipolar I disorder, most recent episode depressed ([place]) 06/16/2021   OSA (obstructive sleep apnea) 03/19/2021   Nocturnal hypoxia 03/19/2021   Physical deconditioning 10/22/2020   Pneumonia of both lungs due to infectious organism 10/19/2020   Prediabetes 10/19/2020   Mild episode of recurrent major depressive disorder ([place]) 06/09/2020   Essential hypertension 06/06/2020   Hyperlipidemia 06/06/2020   Migraine with aura and without status migrainosus, not intractable 06/06/2020   Generalized anxiety disorder 06/06/2020   Hypothyroidism 06/06/2020  Low testosterone 06/06/2020   Moderate episode of recurrent major depressive disorder ([place]) 06/06/2020     Current Outpatient Medications on File Prior to Visit  Medication Sig Dispense Refill   Accu-Chek Softclix Lancets lancets Use as instructed 100 each 12   albuterol (VENTOLIN HFA) 108 (90 Base) MCG/ACT inhaler Inhale 2 puffs into the lungs every 4 (four) hours as needed for wheezing or shortness of breath. 8 g 2   amLODipine (NORVASC) 10 MG tablet Take 1 tablet  (10 mg total) by mouth daily. 90 tablet 1   atorvastatin (LIPITOR) 20 MG tablet Take 1 tablet (20 mg total) by mouth daily. 90 tablet 1   Blood Glucose Monitoring Suppl (ACCU-CHEK GUIDE ME) w/Device KIT Check BS in Q a.m before breakfast 1 kit 0   busPIRone (BUSPAR) 30 MG tablet Take 1 tablet (30 mg total) by mouth 2 (two) times daily. 60 tablet 3   diazepam (VALIUM) 5 MG tablet Take 1 tablet by mouth once daily 30 tablet 0   DULoxetine (CYMBALTA) 60 MG capsule Take 1 capsule (60 mg total) by mouth 2 (two) times daily. 60 capsule 3   HYDROmorphone (DILAUDID) 4 MG tablet Take 8 mg by mouth every 6 (six) hours as needed for severe pain.     influenza vac split quadrivalent PF (FLUARIX) 0.5 ML injection Inject into the muscle. 0.5 mL 0   levothyroxine (SYNTHROID) 50 MCG tablet Take 1 tablet (50 mcg total) by mouth daily. 90 tablet 1   lidocaine (LIDODERM) 5 % Place 1 patch onto the skin daily. Remove & Discard patch within 12 hours or as directed by MD 30 patch 0   lisinopril (ZESTRIL) 20 MG tablet Take 1 tablet (20 mg total) by mouth daily. 90 tablet 1   promethazine (PHENERGAN) 12.5 MG tablet Take 12.5 mg by mouth every 6 (six) hours as needed for nausea or vomiting.     SUMAtriptan (IMITREX) 100 MG tablet Take 100 mg by mouth daily as needed for migraine. May repeat in 2 hours if headache persists or recurs.     ziprasidone (GEODON) 60 MG capsule Take 1 capsule (60 mg total) by mouth 2 (two) times daily with a meal. 60 capsule 3   No current facility-administered medications on file prior to visit.    Allergies  Allergen Reactions   Morphine And Related     Social History   Socioeconomic History   Marital status: Single    Spouse name: Not on file   Number of children: Not on file   Years of education: Not on file   Highest education level: Not on file  Occupational History   Not on file  Tobacco Use   Smoking status: Former    Types: Cigarettes, E-cigarettes    Start date: 40     Quit date: 03/17/2018    Years since quitting: 3.3   Smokeless tobacco: Never   Tobacco comments:    Smoked 2-3 cigarettes per day when smoking.    Currently vaping 1 cartridge per 2 days.  Vaping Use   Vaping Use: Every day   Start date: 06/28/2017   Substances: Nicotine  Substance and Sexual Activity   Alcohol use: Never   Drug use: Yes    Types: Marijuana   Sexual activity: Not on file  Other Topics Concern   Not on file  Social History Narrative   Not on file   Social Determinants of Health   Financial Resource Strain: Not on file  Food  Insecurity: Not on file  Transportation Needs: Not on file  Physical Activity: Not on file  Stress: Not on file  Social Connections: Not on file  Intimate Partner Violence: Not on file    Family History  Problem Relation Age of Onset   Heart failure Mother    Heart failure Father    Heart attack Father     Past Surgical History:  Procedure Laterality Date   BACK SURGERY     CERVICAL SPINE SURGERY      ROS: Review of Systems Negative except as stated above  PHYSICAL EXAM: BP (!) 143/86    Pulse 75    Resp 16    Wt 169 lb 3.2 oz (76.7 kg)    SpO2 97%    BMI 24.28 kg/m   Wt Readings from Last 3 Encounters:  08/03/21 169 lb 3.2 oz (76.7 kg)  06/30/21 175 lb 3.2 oz (79.5 kg)  05/12/21 150 lb (68 kg)    Physical Exam  General appearance -patient is a middle-age Caucasian male in no acute distress.   Mental status -patient appears anxious at times. Neck -no cervical lymphadenopathy.  No thyromegaly or thyroid nodules. Chest - clear to auscultation, no wheezes, rales or rhonchi, symmetric air entry Heart - normal rate, regular rhythm, normal S1, S2, no murmurs, rubs, clicks or gallops Extremities - peripheral pulses normal, no pedal edema, no clubbing or cyanosis Skin -he has about 3 cm callus on the medial aspect of both big toes.  Painful to touch.  CMP Latest Ref Rng & Units 07/31/2021 12/07/2020 11/10/2020  Glucose 70 -  99 mg/dL 124(H) 193(H) 234(H)  BUN 6 - 20 mg/dL 19 15 19   Creatinine 0.61 - 1.24 mg/dL 0.86 0.86 0.80  Sodium 135 - 145 mmol/L 138 135 134(L)  Potassium 3.5 - 5.1 mmol/L 4.2 4.2 4.1  Chloride 98 - 111 mmol/L 102 100 99  CO2 22 - 32 mmol/L 27 27 25   Calcium 8.9 - 10.3 mg/dL 10.1 8.5(L) 8.8(L)  Total Protein 6.5 - 8.1 g/dL - - -  Total Bilirubin 0.3 - 1.2 mg/dL - - -  Alkaline Phos 38 - 126 U/L - - -  AST 15 - 41 U/L - - -  ALT 0 - 44 U/L - - -   Lipid Panel     Component Value Date/Time   CHOL 120 06/18/2020 1003   TRIG 101 06/18/2020 1003   HDL 42 06/18/2020 1003   CHOLHDL 2.9 06/18/2020 1003   LDLCALC 59 06/18/2020 1003    CBC    Component Value Date/Time   WBC 7.2 07/31/2021 1714   RBC 4.56 07/31/2021 1714   HGB 14.4 07/31/2021 1714   HCT 42.1 07/31/2021 1714   PLT 248 07/31/2021 1714   MCV 92.3 07/31/2021 1714   MCH 31.6 07/31/2021 1714   MCHC 34.2 07/31/2021 1714   RDW 11.3 (L) 07/31/2021 1714   LYMPHSABS 1.3 12/07/2020 0829   MONOABS 0.5 12/07/2020 0829   EOSABS 0.1 12/07/2020 0829   BASOSABS 0.0 12/07/2020 0829    ASSESSMENT AND PLAN:  1. Essential hypertension Not at goal.  I recommend increasing the propranolol to 20 mg 3 times a day.  Patient will continue to monitor blood pressure and pulse.  Advised to let me know if pulse drops below 60 consistently.  Continue amlodipine 10 mg and lisinopril 20 mg. - Comprehensive metabolic panel - propranolol (INDERAL) 20 MG tablet; Take 1 tablet (20 mg total) by mouth 3 (three) times  daily.  Dispense: 90 tablet; Refill: 4  2. Hypothyroidism, unspecified type Continue levothyroxine. - TSH  3. Prediabetes Commended him on healthy eating habits and regular exercise.  Encouraged him to keep up the good works - Hemoglobin A1c  4. GAD (generalized anxiety disorder) Plugged into behavioral health services. - propranolol (INDERAL) 20 MG tablet; Take 1 tablet (20 mg total) by mouth 3 (three) times daily.  Dispense: 90  tablet; Refill: 4  5. Bipolar 2 disorder ([place]) See #4 above.  6. Chronic neck pain Patient now on Suboxone as a bridge to getting him off chronic narcotics.  7. Hyperlipidemia, unspecified hyperlipidemia type Continue atorvastatin - Lipid panel  8. Pre-ulcerative corn or callous - Ambulatory referral to Podiatry  9. Need for shingles vaccine Patient agreeable to receiving the shingles vaccine today.  Advised that he can develop redness and swelling at the injection site.  If still use alternating hot and warm compresses. - Varicella-zoster vaccine IM (Shingrix)  Given that his recent PA and lateral chest x-ray came back okay, no need for [place] to do a repeat with nipple markers.  Patient to sign a release for [place] to get the colonoscopy report so that we can update his health maintenance.  Patient was given the opportunity to ask questions.  Patient verbalized understanding of the plan and was able to repeat key elements of the plan.   Orders Placed This Encounter  Procedures   Varicella-zoster vaccine IM (Shingrix)   TSH   Comprehensive metabolic panel   Lipid panel   Hemoglobin A1c   Ambulatory referral to Podiatry     Requested Prescriptions   Signed Prescriptions Disp Refills   propranolol (INDERAL) 20 MG tablet 90 tablet 4    Sig: Take 1 tablet (20 mg total) by mouth 3 (three) times daily.    Return in about 4 months (around 12/01/2021) for Appt with [facility] in 2 wks for BP check.  Sign release to get colonoscopy report.  Karle Plumber, MD, FACP

## 2021-08-04 LAB — LIPID PANEL
Chol/HDL Ratio: 2.3 ratio (ref 0.0–5.0)
Cholesterol, Total: 117 mg/dL (ref 100–199)
HDL: 50 mg/dL (ref >39)
LDL Chol Calc (NIH): 46 mg/dL (ref 0–99)
Triglycerides: 117 mg/dL (ref 0–149)
VLDL Cholesterol Cal: 21 mg/dL (ref 5–40)

## 2021-08-04 LAB — COMPREHENSIVE METABOLIC PANEL
ALT: 20 IU/L (ref 0–44)
AST: 18 IU/L (ref 0–40)
Albumin/Globulin Ratio: 2.4 — ABNORMAL HIGH (ref 1.2–2.2)
Albumin: 5.3 g/dL — ABNORMAL HIGH (ref 4.0–5.0)
Alkaline Phosphatase: 83 IU/L (ref 44–121)
BUN/Creatinine Ratio: 20 (ref 9–20)
BUN: 18 mg/dL (ref 6–24)
Bilirubin Total: 0.3 mg/dL (ref 0.0–1.2)
CO2: 28 mmol/L (ref 20–29)
Calcium: 9.6 mg/dL (ref 8.7–10.2)
Chloride: 101 mmol/L (ref 96–106)
Creatinine, Ser: 0.91 mg/dL (ref 0.76–1.27)
Globulin, Total: 2.2 g/dL (ref 1.5–4.5)
Glucose: 128 mg/dL — ABNORMAL HIGH (ref 70–99)
Potassium: 4.9 mmol/L (ref 3.5–5.2)
Sodium: 141 mmol/L (ref 134–144)
Total Protein: 7.5 g/dL (ref 6.0–8.5)
eGFR: 103 mL/min/{1.73_m2} (ref >59)

## 2021-08-04 LAB — HEMOGLOBIN A1C
Est. average glucose Bld gHb Est-mCnc: 117 mg/dL
Hgb A1c MFr Bld: 5.7 % — ABNORMAL HIGH (ref 4.8–5.6)

## 2021-08-04 LAB — TSH: TSH: 1.34 u[IU]/mL (ref 0.450–4.500)

## 2021-08-06 ENCOUNTER — Ambulatory Visit: Payer: Self-pay

## 2021-08-06 NOTE — Telephone Encounter (Signed)
°  Chief Complaint: HTN Symptoms: BP 171/105, slight headache, and some chest pain but feels anxiety related Frequency: today Pertinent Negatives: NA Disposition: [] ED /[] Urgent Care (no appt availability in office) / [x] Appointment(In office/virtual)/ []  [facility]/ [] Home Care/ [] Refused Recommended Disposition /[] [facility]/ []  Follow-up with PCP Additional Notes: pt states he has bad anxiety and numbers were good this morning but as day went on BP increased. Asked pt if he felt anxious and he states possibly could be contributing to BP but was unsure. Takes Valium for anxiety so took one of those and I instructed him to recheck his BP in 1-2 hours to see if it's came down or not.    Reason for Disposition  Systolic BP  >= 160 OR Diastolic >= 100  Answer Assessment - Initial Assessment Questions 1. BLOOD PRESSURE: "What is the blood pressure?" "Did you take at least two measurements 5 minutes apart?"     171/105 2. ONSET: "When did you take your blood pressure?"     10 mins ago  3. HOW: "How did you obtain the blood pressure?" (e.g., visiting nurse, automatic home BP monitor)     Auto cuff 4. HISTORY: "Do you have a history of high blood pressure?"     yes 5. MEDICATIONS: "Are you taking any medications for blood pressure?" "Have you missed any doses recently?"     no 6. OTHER SYMPTOMS: "Do you have any symptoms?" (e.g., headache, chest pain, blurred vision, difficulty breathing, weakness)     Slight headache  Protocols used: Blood Pressure - High-A-AH

## 2021-08-07 ENCOUNTER — Encounter: Payer: Self-pay | Admitting: Family

## 2021-08-07 ENCOUNTER — Ambulatory Visit (INDEPENDENT_AMBULATORY_CARE_PROVIDER_SITE_OTHER): Payer: 59 | Admitting: Licensed Clinical Social Worker

## 2021-08-07 ENCOUNTER — Other Ambulatory Visit: Payer: Self-pay

## 2021-08-07 ENCOUNTER — Ambulatory Visit: Payer: 59 | Attending: Family | Admitting: Family

## 2021-08-07 VITALS — BP 147/94 | HR 84 | Resp 16 | Wt 166.6 lb

## 2021-08-07 DIAGNOSIS — F331 Major depressive disorder, recurrent, moderate: Secondary | ICD-10-CM

## 2021-08-07 DIAGNOSIS — F411 Generalized anxiety disorder: Secondary | ICD-10-CM

## 2021-08-07 DIAGNOSIS — I10 Essential (primary) hypertension: Secondary | ICD-10-CM

## 2021-08-07 NOTE — Progress Notes (Signed)
Samuel Ewing, is a 51 y.o. male here with complaints of elevated Blood pressure.  DUK:[id number]  [id number]  DOB - 02-26-1971  Subjective:  Chief Complaint and HPI:  Samuel Ewing is a 51 y.o. male who presents to the clinic with complaints of elevated BP at home.  PCP increased his Propanolol to 20 mg TID. Takes Lisinopril and Norvasc daily. Never seen by a cardiologist before. Seen by Dr. Wynetta Emery on Monday and his lab work was normal. BP was 170/100 at home last night. Recent adjustment to anxiety medications by Psych.  Denies chest pain, shortness of breath, headache, dizziness, or any other symptom.  ED/Hospital notes reviewed.   Social History: Reviewed Family history: Reviewed  ROS:   Constitutional:  No f/c, No night sweats, No unexplained weight loss. EENT:  No vision changes, No blurry vision, No hearing changes. No mouth, throat, or ear problems.  Respiratory: No cough, No SOB Cardiac: No CP, no palpitations GI:  No abd pain, No N/V/D. Endocrine:  No polydipsia. No polyuria.  Psych: Denies SI/HI  No problems updated.  ALLERGIES: Allergies  Allergen Reactions   Morphine And Related     PAST MEDICAL HISTORY: Past Medical History:  Diagnosis Date   Anxiety    Depression    Hypertension    Migraine    Opiate dependence, continuous ([place])    Opiate withdrawal ([place])    Pneumonia 11/14/2020    MEDICATIONS AT HOME: Prior to Admission medications   Medication Sig Start Date End Date Taking? Authorizing Provider  Accu-Chek Softclix Lancets lancets Use as instructed 12/08/20  Yes Ladell Pier, MD  amLODipine (NORVASC) 10 MG tablet Take 1 tablet (10 mg total) by mouth daily. 04/23/21  Yes Ladell Pier, MD  atorvastatin (LIPITOR) 20 MG tablet Take 1 tablet (20 mg total) by mouth daily. 04/23/21  Yes Ladell Pier, MD  Blood Glucose Monitoring Suppl (ACCU-CHEK GUIDE ME) w/Device KIT Check BS in Q a.m before breakfast 12/08/20  Yes Ladell Pier, MD  busPIRone (BUSPAR) 30 MG tablet Take 1 tablet (30 mg total) by mouth 2 (two) times daily. 06/16/21  Yes Eulis Canner E, NP  diazepam (VALIUM) 5 MG tablet Take 1 tablet by mouth once daily 07/20/21  Yes Eulis Canner E, NP  DULoxetine (CYMBALTA) 60 MG capsule Take 1 capsule (60 mg total) by mouth 2 (two) times daily. 06/16/21  Yes Eulis Canner E, NP  HYDROmorphone (DILAUDID) 4 MG tablet Take 8 mg by mouth every 6 (six) hours as needed for severe pain. 09/22/20  Yes [provider]  influenza vac split quadrivalent PF (FLUARIX) 0.5 ML injection Inject into the muscle. 05/12/21  Yes Carlyle Basques, MD  levothyroxine (SYNTHROID) 50 MCG tablet Take 1 tablet (50 mcg total) by mouth daily. 04/23/21  Yes Ladell Pier, MD  lisinopril (ZESTRIL) 20 MG tablet Take 1 tablet (20 mg total) by mouth daily. 04/23/21  Yes Ladell Pier, MD  propranolol (INDERAL) 20 MG tablet Take 1 tablet (20 mg total) by mouth 3 (three) times daily. 08/03/21  Yes Ladell Pier, MD  SUMAtriptan (IMITREX) 100 MG tablet Take 100 mg by mouth daily as needed for migraine. May repeat in 2 hours if headache persists or recurs.   Yes [provider]  ziprasidone (GEODON) 60 MG capsule Take 1 capsule (60 mg total) by mouth 2 (two) times daily with a meal. 06/16/21  Yes Eulis Canner E, NP  albuterol (VENTOLIN HFA) 108 (90 Base) MCG/ACT  inhaler Inhale 2 puffs into the lungs every 4 (four) hours as needed for wheezing or shortness of breath. Patient not taking: Reported on 08/07/2021 10/24/20   Dwyane Dee, MD  lidocaine (LIDODERM) 5 % Place 1 patch onto the skin daily. Remove & Discard patch within 12 hours or as directed by MD Patient not taking: Reported on 08/07/2021 08/17/20   Caccavale, Sophia, PA-C  promethazine (PHENERGAN) 12.5 MG tablet Take 12.5 mg by mouth every 6 (six) hours as needed for nausea or vomiting. Patient not taking: Reported on 08/07/2021 09/12/20   [provider]     Objective:  EXAM:   Vitals:   08/07/21 1549  BP: (!) 147/94  Pulse: 84  Resp: 16  SpO2: 99%  Weight: 166 lb 9.6 oz (75.6 kg)    General appearance : A&OX3. NAD. Non-toxic-appearing Neck: supple, no JVD. No cervical lymphadenopathy. No thyromegaly Chest/Lungs:  Breathing-non-labored, Good air entry bilaterally, breath sounds normal without rales, rhonchi, or wheezing  CVS: S1 S2 regular, no murmurs, gallops, rubs  Abdomen: Bowel sounds present, Non tender and not distended with no gaurding, rigidity or rebound. Extremities: Bilateral Lower Ext shows no edema, both legs are warm to touch with = pulse throughout Neurology:  CN II-XII grossly intact, Non focal.   Psych:  TP linear. J/I WNL. Normal speech. Appropriate eye contact and affect.   Data Review Lab Results  Component Value Date   HGBA1C 5.7 (H) 08/03/2021   HGBA1C 6.4 (H) 10/19/2020     Assessment & Plan   1. Essential hypertension - Labs today - Take all the medications as prescribed - Follow up in 1 month, if indicated cardiology referral. - F/U Lurena Joiner the Pharmacist as scheduled   The patient was given clear instructions to go to ER or return to medical center if symptoms don't improve, worsen or new problems develop. The patient verbalized understanding. The patient was told to call to get lab results if they haven't heard anything in the next week.     Feliberto Gottron, APRN, FNP-C [facility] and [facility] [place], [place]   08/07/2021, 4:19 PM

## 2021-08-07 NOTE — Plan of Care (Signed)
Pt agreeable to plan  ?

## 2021-08-07 NOTE — Progress Notes (Signed)
He  THERAPIST PROGRESS NOTE   Virtual Visit via Video Note  I connected with Samuel Ewing on 08/07/21 at 10:00 AM EST by a video enabled telemedicine application and verified that I am speaking with the correct person using two identifiers.  Location: Patient: [facility]  Provider: Providers    I discussed the limitations of evaluation and management by telemedicine and the availability of in person appointments. The patient expressed understanding and agreed to procee    I discussed the assessment and treatment plan with the patient. The patient was provided an opportunity to ask questions and all were answered. The patient agreed with the plan and demonstrated an understanding of the instructions.   The patient was advised to call back or seek an in-person evaluation if the symptoms worsen or if the condition fails to improve as anticipated.  I provided 40 minutes of non-face-to-face time during this encounter.   Samuel Cooks, LCSW   Participation Level: Active  Behavioral Response: CasualAlertAnxious and Depressed  Type of Therapy: Individual Therapy  Treatment Goals addressed: Anxiety and Coping  Interventions: CBT, Solution Focused, and Supportive  Summary: Samuel Ewing is a 51 y.o. male who presents with depression anxious mood\affect.  Patient was pleasant, cooperative, maintained good eye contact.  He was dressed casually and engaged well in therapy session  Patient comes in today as a transfer from a different LCSW at [facility].  Patient reports he has been struggling with anxiety for the last 3 years.  Patient reports in that time he has lost his job, lost his marriage, and has a dependence problem on opioids.  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  Patient reports first time a disc was bulging and he was placed on opioids and then weaned off  onto Suboxone.  Patient then lost his Suboxone after he lost his job and the disc ruptured.  Patient then reports he was placed back on opioids and in the last month he was replaced back on Suboxone.    Samuel Ewing states in 2020 he lost his job due to COVID-19 through [employer]  He reports that due to his job in [employer] he had to have a closed marriage because of his sexuality as "gay".  Samuel Ewing states that he is getting a divorce but it has been very civil.  Patient reports that the divorce has been lingering for the past 8 months even though there has been years of separation prior to divorce proceedings.  Patient reports due to the close marriage that was done in [place] there is more documentation needed to finalize a divorce.  Patient endorses anxiety for attention, worried, hyperventilation, hypertension, and restlessness.  Patient states triggers are social anxiety, driving long distances, and his sobriety.  Samuel Ewing states that he does have hypertensive episodes last 1 patient's blood pressure was 200/98 patient was provided a Valium and blood pressure still remained 170/80.  Samuel Ewing does have a follow-up appointment with his primary care physician in 2 weeks.  LCSW did indicate the patient to seek medical treatment for medical emergencies such as hypertensive episodes.  Samuel Ewing reports that he has now weaned off of opioids prescribed by his doctors and is currently on Suboxone the last 30 days.   Plan:  Samuel Ewing reports interest in [facility] IOP due to opioid dependence.  LCSW will send referral to [facility] at [facility]  behavioral health center.  Patient also to follow-up with his LCSW for next available appointment.  Suicidal/Homicidal: Nowithout intent/plan  Therapist Response:    Interventions: Interventions utilized in today's session with cognitive behavioral therapy, person centered therapy, and narrative therapy.  LCSW utilized techniques to help patient express his  thoughts, feelings, and emotions.  LCSW educated patient on [facility] IOP group.  LCSW utilized language for praise, encouragement, empowerment.  Plan: Return again in 6 weeks.      Samuel Cooks, LCSW 08/07/2021

## 2021-08-10 ENCOUNTER — Ambulatory Visit: Payer: 59 | Admitting: Podiatry

## 2021-08-10 ENCOUNTER — Telehealth (HOSPITAL_COMMUNITY): Payer: Self-pay | Admitting: *Deleted

## 2021-08-10 ENCOUNTER — Other Ambulatory Visit: Payer: Self-pay

## 2021-08-10 DIAGNOSIS — M21611 Bunion of right foot: Secondary | ICD-10-CM

## 2021-08-10 DIAGNOSIS — M79671 Pain in right foot: Secondary | ICD-10-CM | POA: Diagnosis not present

## 2021-08-10 DIAGNOSIS — M2012 Hallux valgus (acquired), left foot: Secondary | ICD-10-CM

## 2021-08-10 DIAGNOSIS — M2011 Hallux valgus (acquired), right foot: Secondary | ICD-10-CM

## 2021-08-10 DIAGNOSIS — L84 Corns and callosities: Secondary | ICD-10-CM | POA: Diagnosis not present

## 2021-08-10 DIAGNOSIS — M79672 Pain in left foot: Secondary | ICD-10-CM | POA: Diagnosis not present

## 2021-08-10 DIAGNOSIS — M21612 Bunion of left foot: Secondary | ICD-10-CM

## 2021-08-10 NOTE — Telephone Encounter (Signed)
Pt returned call to provider. Please call pt back at provider's earliest convenience.

## 2021-08-10 NOTE — Telephone Encounter (Signed)
Provider called patient and was informed that recently his blood pressure has been elevated.  He notes that his primary care doctor suggest that it could be his Geodon.  He however notes that he finds Geodon effective in managing his depression and mood.  Propanolol was increased to help manage blood pressure.  At this time patient will continue Geodon as prescribed.  He will follow-up with provider in 1 month as well as his primary care doctor.  No other concerns at this time.

## 2021-08-10 NOTE — Telephone Encounter (Signed)
Patient LVM stating that on his last appointment that Dr Doyne Keel increased his  ziprasidone (GEODON) 60 MG capsule. And since then his Blood Pressure has been elevated. Stated that he had a appointment with PCP same day message left. But the drug information pamphlet stated to notify  the Doctor of any changes.

## 2021-08-10 NOTE — Patient Instructions (Signed)
Look for urea 40% cream or ointment and apply to the thickened dry skin / calluses. This can be bought over the counter, at a pharmacy or online such as [employer].    More silicone pads can be purchased from:  [email or web address]  Jills Gel pads are also on [employer]

## 2021-08-12 ENCOUNTER — Ambulatory Visit: Payer: 59 | Attending: Internal Medicine | Admitting: Pharmacist

## 2021-08-12 ENCOUNTER — Other Ambulatory Visit: Payer: Self-pay

## 2021-08-12 VITALS — BP 108/71 | HR 83

## 2021-08-12 DIAGNOSIS — I10 Essential (primary) hypertension: Secondary | ICD-10-CM | POA: Diagnosis not present

## 2021-08-12 NOTE — Progress Notes (Signed)
° °  S:     No chief complaint on file.   Samuel Ewing is a 51 y.o. male who presents for hypertension evaluation, education, and management. PMH is significant for HTN, migraines, OSA, hypothyroidism, HLD, GAD, MDD, bipolar disorder. Patient was referred and last seen by Primary Care Provider, Dr. Wynetta Emery, on 08/03/21. BP was 143/86 at that visit. Propranolol dose was increased,   Today, He arrives in good spirits and presents without assistance. Denies dizziness, headache, blurred vision, swelling.   Patient reports hypertension is longstanding. He brings a log of home BP for review. He reports that anxiety may contribute some to his hypertension.   Family/Social history:  -Fhx: heart failure, MI  -Tobacco: former smoker (quit in 2019) -Alcohol: none reported  Medication adherence reported . Patient has taken BP medications today.   Current antihypertensives include:  propranolol 20 mg TID, lisinopril 20mg  daily (7 AM), amlodipine 10mg  daily (7AM)  Reported home BP readings:  Brachial cuff. Rests for a couple of minutes before taking 2/8: 114/82, 131/84 2/9:126/75, 159/100, 135/81, 134/82 2/10: 141/82, 138/88, 124/80, 165/99, 130/81 2/11: 119/7, 140/85, 166/107, 150/94, 119/84 2/12: 129/92, 115/82, 120/70 2/13: 132/84, 145/99, 143/86 2/14: 121/78, 130/78, 161/93, 154/101  Patient reported dietary habits:  - Drinks 1 cup of coffee in the morning - Compliant with salt restriction   Patient-reported exercise habits:  -Walks on the treadmill at his apartment complex (20 minutes daily)  O:  Vitals:   08/12/21 1352  BP: 108/71  Pulse: 83     Last 3 Office BP readings: BP Readings from Last 3 Encounters:  08/07/21 (!) 147/94  08/03/21 (!) 143/86  07/31/21 (!) 139/96    BMET    Component Value Date/Time   NA 141 08/03/2021 1508   K 4.9 08/03/2021 1508   CL 101 08/03/2021 1508   CO2 28 08/03/2021 1508   GLUCOSE 128 (H) 08/03/2021 1508   GLUCOSE 124 (H)  07/31/2021 1714   BUN 18 08/03/2021 1508   CREATININE 0.91 08/03/2021 1508   CALCIUM 9.6 08/03/2021 1508   GFRNONAA >60 07/31/2021 1714    Renal function: Estimated Creatinine Clearance: 100.3 mL/min (by C-G formula based on SCr of 0.91 mg/dL).  Clinical ASCVD: No  The ASCVD Risk score (Arnett DK, et al., 2019) failed to calculate for the following reasons:   The valid total cholesterol range is 130 to 320 mg/dL   A/P: Hypertension longstanding currently at goal on current medications. BP goal < 130/80 mmHg. Medication adherence appears optimal. BP readings are somewhat scattered by appear closer to if not at goal in the morning and higher as they day goes on. Pt may benefit from taking lisinopril and amlodipine in the evening before bed. Will have him return in April and reassess.   -Continued current regimen for now.   -F/u labs ordered - none -Counseled on lifestyle modifications for blood pressure control including reduced dietary sodium, increased exercise, adequate sleep. -Encouraged patient to chec k BP at home and bring log of readings to next visit. Counseled on proper use of home BP cuff.   Results reviewed and written information provided. Patient verbalized understanding of treatment plan. Total time in face-to-face counseling 20 minutes.   F/u clinic visit in 1.5 months.  Benard Halsted, PharmD, [place], [place] [phone]

## 2021-08-13 LAB — ALDOSTERONE: ALDOSTERONE: 2.3 ng/dL (ref 0.0–30.0)

## 2021-08-16 ENCOUNTER — Encounter: Payer: Self-pay | Admitting: Podiatry

## 2021-08-16 NOTE — Progress Notes (Signed)
°  Subjective:  Patient ID: Samuel Ewing, male    DOB: Aug 27, 1970,  MRN: [id number]  Chief Complaint  Patient presents with   Callouses     (New Patient) Diagnosis: L84 (ICD-10-CM) - Pre-ulcerative corn or callous Referring Provider: Ladell Pier     51 y.o. male presents with the above complaint. History confirmed with patient.  Has calluses on the inside edge of both great toes  Objective:  Physical Exam: warm, good capillary refill, no trophic changes or ulcerative lesions, normal DP and PT pulses, normal sensory exam, and pinch callus medial hallux bilateral painful to pressure, he has hallux valgus deformity Assessment:   1. Callus of foot   2. Pain in both feet   3. Hallux valgus with bunions, left   4. Hallux valgus with bunions, right      Plan:  Patient was evaluated and treated and all questions answered.  All symptomatic hyperkeratoses were safely debrided with a sterile #15 blade to patient's level of comfort without incident. We discussed preventative and palliative care of these lesions including supportive and accommodative shoegear, padding, prefabricated and custom molded accommodative orthoses, use of a pumice stone and lotions/creams daily.  Recommended urea cream  Return if symptoms worsen or fail to improve.

## 2021-08-18 ENCOUNTER — Ambulatory Visit: Payer: 59 | Admitting: Pharmacist

## 2021-08-24 ENCOUNTER — Telehealth (HOSPITAL_COMMUNITY): Payer: Self-pay | Admitting: Licensed Clinical Social Worker

## 2021-08-24 NOTE — Telephone Encounter (Signed)
Call left VM as provider had an open to be seen for counseling at  10 or 11 today.

## 2021-09-02 ENCOUNTER — Telehealth (INDEPENDENT_AMBULATORY_CARE_PROVIDER_SITE_OTHER): Payer: 59 | Admitting: Psychiatry

## 2021-09-02 DIAGNOSIS — F411 Generalized anxiety disorder: Secondary | ICD-10-CM

## 2021-09-02 DIAGNOSIS — F331 Major depressive disorder, recurrent, moderate: Secondary | ICD-10-CM

## 2021-09-02 DIAGNOSIS — F3130 Bipolar disorder, current episode depressed, mild or moderate severity, unspecified: Secondary | ICD-10-CM

## 2021-09-02 MED ORDER — DULOXETINE HCL 60 MG PO CPEP: 60.0000 mg | ORAL_CAPSULE | Freq: Two times a day (BID) | ORAL | 3 refills | Status: DC

## 2021-09-02 MED ORDER — ZIPRASIDONE HCL 60 MG PO CAPS: 60.0000 mg | ORAL_CAPSULE | Freq: Two times a day (BID) | ORAL | 3 refills | Status: DC

## 2021-09-02 MED ORDER — BUSPIRONE HCL 30 MG PO TABS: 30.0000 mg | ORAL_TABLET | Freq: Two times a day (BID) | ORAL | 3 refills | Status: DC

## 2021-09-02 NOTE — Progress Notes (Signed)
[place] MD/PA/NP OP Progress Note  09/02/2021 3:09 PM [name]  MRN:  [id number]   Virtual Visit via Video Note  I connected with Samuel Ewing on 09/02/21 at  2:30 PM EST by a video enabled telemedicine application and verified that I am speaking with the correct person using two identifiers.  Location: Patient: home Provider: off site   I discussed the limitations of evaluation and management by telemedicine and the availability of in person appointments. The patient expressed understanding and agreed to proceed.    I discussed the assessment and treatment plan with the patient. The patient was provided an opportunity to ask questions and all were answered. The patient agreed with the plan and demonstrated an understanding of the instructions.   The patient was advised to call back or seek an in-person evaluation if the symptoms worsen or if the condition fails to improve as anticipated.  I provided 20 minutes of non-face-to-face time during this encounter.   Franne Grip, NP   Chief Complaint: Medication management  HPI: Samuel Ewing is a 51 year old male presenting to [facility] for a follow up psychiatric evaluation. Patient has a psychiatric history of generalized anxiety disorder, major depressive disorder, and bipolar disorder. Patient symptoms are managed with Geodon 60 mg twice daily, Buspar 30 mg twice daily, Valium 5 mg daily,  and cymbalta 60 mg twice daily.  Patient reports taking gabapentin 600 mg twice daily which is prescribed by an alternate provider for pain.Patient reports having cervical surgery and suffers from neck and shoulder pain. 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  Patient reports being depressed due to life  stressors.  Patient reports that his anxiety has increased.  Patient continues to participate in therapy and his next therapy appointment is March 27.  Patient reports that he feels as though he has a good therapist now and his last session went very well.  Patient denies getting Valium filled recently and states that he has not been using Valium daily.  Patient denies suicidal homicidal ideations, auditory or visual hallucinations, or paranoia or delusions.    Visit Diagnosis:    ICD-10-CM   1. Generalized anxiety disorder  F41.1     2. Bipolar I disorder, most recent episode depressed ([place])  F31.30     3. Moderate episode of recurrent major depressive disorder (HCC)  F33.1       Past Psychiatric History: See above  Past Medical History:  Past Medical History:  Diagnosis Date   Anxiety    Depression    Hypertension    Migraine    Opiate dependence, continuous (HCC)    Opiate withdrawal ([place])    Pneumonia 11/14/2020    Past Surgical History:  Procedure Laterality Date   BACK SURGERY     CERVICAL SPINE SURGERY      Family Psychiatric History: None known  Family History:  Family History  Problem Relation Age of Onset   Heart failure Mother    Heart failure Father    Heart attack Father     Social History:  Social History   Socioeconomic History   Marital status: Single    Spouse name: Not on file   Number of children: Not on file   Years of education: Not on  file   Highest education level: Not on file  Occupational History   Not on file  Tobacco Use   Smoking status: Former    Types: Cigarettes, E-cigarettes    Start date: 1986    Quit date: 03/17/2018    Years since quitting: 3.4   Smokeless tobacco: Never   Tobacco comments:    Smoked 2-3 cigarettes per day when smoking.    Currently vaping 1 cartridge per 2 days.  Vaping Use   Vaping Use: Every day   Start date: 06/28/2017   Substances: Nicotine  Substance and Sexual Activity   Alcohol use: Never    Drug use: Yes    Types: Marijuana   Sexual activity: Not on file  Other Topics Concern   Not on file  Social History Narrative   Not on file   Social Determinants of Health   Financial Resource Strain: Not on file  Food Insecurity: Not on file  Transportation Needs: Not on file  Physical Activity: Not on file  Stress: Not on file  Social Connections: Not on file    Allergies:  Allergies  Allergen Reactions   Morphine And Related     Metabolic Disorder Labs: Lab Results  Component Value Date   HGBA1C 5.7 (H) 08/03/2021   MPG 136.98 10/19/2020   No results found for: PROLACTIN Lab Results  Component Value Date   CHOL 117 08/03/2021   TRIG 117 08/03/2021   HDL 50 08/03/2021   CHOLHDL 2.3 08/03/2021   LDLCALC 46 08/03/2021   LDLCALC 59 06/18/2020   Lab Results  Component Value Date   TSH 1.340 08/03/2021   TSH 0.431 10/20/2020    Therapeutic Level Labs: No results found for: LITHIUM No results found for: VALPROATE No components found for:  CBMZ  Current Medications: Current Outpatient Medications  Medication Sig Dispense Refill   gabapentin (NEURONTIN) 600 MG tablet Take 600 mg by mouth 2 (two) times daily. For pain     Accu-Chek Softclix Lancets lancets Use as instructed 100 each 12   albuterol (VENTOLIN HFA) 108 (90 Base) MCG/ACT inhaler Inhale 2 puffs into the lungs every 4 (four) hours as needed for wheezing or shortness of breath. (Patient not taking: Reported on 08/07/2021) 8 g 2   amLODipine (NORVASC) 10 MG tablet Take 1 tablet (10 mg total) by mouth daily. 90 tablet 1   atorvastatin (LIPITOR) 20 MG tablet Take 1 tablet (20 mg total) by mouth daily. 90 tablet 1   Blood Glucose Monitoring Suppl (ACCU-CHEK GUIDE ME) w/Device KIT Check BS in Q a.m before breakfast 1 kit 0   busPIRone (BUSPAR) 30 MG tablet Take 1 tablet (30 mg total) by mouth 2 (two) times daily. 60 tablet 3   diazepam (VALIUM) 5 MG tablet Take 1 tablet by mouth once daily 30 tablet 0    DULoxetine (CYMBALTA) 60 MG capsule Take 1 capsule (60 mg total) by mouth 2 (two) times daily. 60 capsule 3   HYDROmorphone (DILAUDID) 4 MG tablet Take 8 mg by mouth every 6 (six) hours as needed for severe pain.     influenza vac split quadrivalent PF (FLUARIX) 0.5 ML injection Inject into the muscle. 0.5 mL 0   levothyroxine (SYNTHROID) 50 MCG tablet Take 1 tablet (50 mcg total) by mouth daily. 90 tablet 1   lidocaine (LIDODERM) 5 % Place 1 patch onto the skin daily. Remove & Discard patch within 12 hours or as directed by MD (Patient not taking: Reported on 08/07/2021) 30  patch 0   lisinopril (ZESTRIL) 20 MG tablet Take 1 tablet (20 mg total) by mouth daily. 90 tablet 1   promethazine (PHENERGAN) 12.5 MG tablet Take 12.5 mg by mouth every 6 (six) hours as needed for nausea or vomiting. (Patient not taking: Reported on 08/07/2021)     propranolol (INDERAL) 20 MG tablet Take 1 tablet (20 mg total) by mouth 3 (three) times daily. 90 tablet 4   SUMAtriptan (IMITREX) 100 MG tablet Take 100 mg by mouth daily as needed for migraine. May repeat in 2 hours if headache persists or recurs.     ziprasidone (GEODON) 60 MG capsule Take 1 capsule (60 mg total) by mouth 2 (two) times daily with a meal. 60 capsule 3   No current facility-administered medications for this visit.     Musculoskeletal: Strength & Muscle Tone: N/A virtual visit Gait & Station: N/A Patient leans: N/A  Psychiatric Specialty Exam: Review of Systems  Psychiatric/Behavioral:  Positive for dysphoric mood. Negative for hallucinations, self-injury and suicidal ideas. The patient is nervous/anxious.   All other systems reviewed and are negative.  There were no vitals taken for this visit.There is no height or weight on file to calculate BMI.  General Appearance: Fairly Groomed  Eye Contact:  Good  Speech:  Clear and Coherent  Volume:  Normal  Mood:  Depressed  Affect:  Congruent  Thought Process:  Goal Directed  Orientation:  Full  (Time, Place, and Person)  Thought Content: Logical   Suicidal Thoughts:  No  Homicidal Thoughts:  No  Memory: Good  Judgement: Good  Insight: Good  Psychomotor Activity: N/A  Concentration: Good  Recall: Good  Fund of Knowledge: Good  Language: Good  Akathisia: N/A  Handed: Right  AIMS (if indicated): Not done  Assets:  Communication Skills Desire for Improvement  ADL's:  Intact  Cognition: WNL  Sleep:  Good   Screenings: GAD-7    Flowsheet Row Video Visit from 06/16/2021 in [facility] Video Visit from 03/11/2021 in [facility] Counselor from 12/18/2020 in [facility] Office Visit from 12/08/2020 in [place] Video Visit from 11/14/2020 in [facility]  Total GAD-7 Score 16 18 21 21 21       PHQ2-9    Flowsheet Row Video Visit from 06/16/2021 in [facility] Video Visit from 03/11/2021 in [facility] Counselor from 12/18/2020 in [facility] Office Visit from 12/08/2020 in [place] Video Visit from 11/14/2020 in [place]  PHQ-2 Total Score 6 5 4 6 6   PHQ-9 Total Score 24 22 16 22 18       [place] [facility] from 07/31/2021 in [facility] Video Visit from 06/16/2021 in [facility] Video Visit from 03/11/2021 in [place] No Risk Error: Q7 should not be populated when Q6 is No No Risk        Assessment and Plan: Samuel Ewing is a 51 year old male presenting to [facility] for a follow up psychiatric evaluation. Patient has a psychiatric history of generalized anxiety disorder, major depressive disorder, and bipolar disorder. Patient symptoms are  managed with Geodon 60 mg twice daily, Buspar 30 mg twice daily, Valium 5 mg daily,  and cymbalta 60 mg twice daily.  Patient to  complete down taper of Valium.  Valium discontinued.  Patient to continue all medications as previously prescribed.  Medications refilled.  Patient to continue in individual therapy to manage symptoms of anxiety and recent stressors in life.   Collaboration of Care: Collaboration of Care: Medication Management AEB medications E scribed to patient's preferred pharmacy  1. Generalized anxiety disorder  - busPIRone (BUSPAR) 30 MG tablet; Take 1 tablet (30 mg total) by mouth 2 (two) times daily.  Dispense: 60 tablet; Refill: 3 - DULoxetine (CYMBALTA) 60 MG capsule; Take 1 capsule (60 mg total) by mouth 2 (two) times daily.  Dispense: 60 capsule; Refill: 3 - ziprasidone (GEODON) 60 MG capsule; Take 1 capsule (60 mg total) by mouth 2 (two) times daily with a meal.  Dispense: 60 capsule; Refill: 3  2. Bipolar I disorder, most recent episode depressed ([place])  - ziprasidone (GEODON) 60 MG capsule; Take 1 capsule (60 mg total) by mouth 2 (two) times daily with a meal.  Dispense: 60 capsule; Refill: 3  3. Moderate episode of recurrent major depressive disorder ([place])   Other orders Return to care in 6 weeks Continue with therapy  Patient/Guardian was advised Release of Information must be obtained prior to any record release in order to collaborate their care with an outside provider. Patient/Guardian was advised if they have not already done so to contact the registration department to sign all necessary forms in order for [place] to release information regarding their care.   Consent: Patient/Guardian gives verbal consent for treatment and assignment of benefits for services provided during this visit. Patient/Guardian expressed understanding and agreed to proceed.    Franne Grip, NP 09/02/2021, 3:09 PM

## 2021-09-03 ENCOUNTER — Other Ambulatory Visit (HOSPITAL_COMMUNITY): Payer: Self-pay | Admitting: Psychiatry

## 2021-09-03 DIAGNOSIS — F411 Generalized anxiety disorder: Secondary | ICD-10-CM

## 2021-09-14 ENCOUNTER — Telehealth (HOSPITAL_COMMUNITY): Payer: Self-pay | Admitting: *Deleted

## 2021-09-14 NOTE — Telephone Encounter (Signed)
Dr Ronne Binning Patient Faxed Refill Request sent to Covering Provider Cicely Penn ? ?diazepam (VALIUM) 5 MG tablet ?Take 1 tablet by mouth once daily ?

## 2021-09-15 ENCOUNTER — Other Ambulatory Visit (HOSPITAL_COMMUNITY): Payer: Self-pay | Admitting: Psychiatry

## 2021-09-21 ENCOUNTER — Telehealth (HOSPITAL_COMMUNITY): Payer: Self-pay | Admitting: Professional

## 2021-09-21 ENCOUNTER — Ambulatory Visit (INDEPENDENT_AMBULATORY_CARE_PROVIDER_SITE_OTHER): Payer: 59 | Admitting: Licensed Clinical Social Worker

## 2021-09-21 DIAGNOSIS — F331 Major depressive disorder, recurrent, moderate: Secondary | ICD-10-CM | POA: Diagnosis not present

## 2021-09-21 DIAGNOSIS — F411 Generalized anxiety disorder: Secondary | ICD-10-CM

## 2021-09-21 NOTE — Progress Notes (Signed)
? ?  THERAPIST PROGRESS NOTE ? ? ?Virtual Visit via Video Note ? ?I connected with Samuel Ewing on 09/21/21 at  1:00 PM EDT by a video enabled telemedicine application and verified that I am speaking with the correct person using two identifiers. ? ?Location: ?Patient: Samuel Ewing  ?Provider: Providers Home  ?  ?I discussed the limitations of evaluation and management by telemedicine and the availability of in person appointments. The patient expressed understanding and agreed to proceed. ? ? ? ?  ?I discussed the assessment and treatment plan with the patient. The patient was provided an opportunity to ask questions and all were answered. The patient agreed with the plan and demonstrated an understanding of the instructions. ?  ?The patient was advised to call back or seek an in-person evaluation if the symptoms worsen or if the condition fails to improve as anticipated. ? ?I provided 45 minutes of non-face-to-face time during this encounter. ? ? ?Dory Horn, Samuel Ewing  ?Session Time: 45  ? ?Participation Level: Active ? ?Behavioral Response: CasualAlertAnxious and Depressed ? ?Type of Therapy: Individual Therapy ? ?Treatment Goals addressed:  ? ?ProgressTowards Goals: Progressing ? ?Interventions: CBT, Motivational Interviewing, and Supportive ? ?Summary: Samuel Ewing is a 51 y.o. male who presents with .  ? ?Suicidal/Homicidal: Nowithout intent/plan ? ?Therapist Response:  ?Pt was alert and oriented x 5. He was dressed casually and engaged well in therapy session. He was pleasant, cooperative, and maintained good eye contact. Samuel Ewing presented with a depressed and anxious mood/affect.  ? 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 He reports that he finalized his divorce after being separated from his husband for the last 4 years. Pt reports that this has been a long time coming, however has been complex as pt does work for a [employer] and pt attempted to keep his marriage  secret for the sake of his employment. 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 Samuel Ewing reports that his position was eliminated in the last 30 days. This has been stressful as pt now is living completely off his savings. Samuel Ewing reports that he does have a virtual interview for a church in [place] in the next 30 days. Pt reports that the salary that he requires will not be met in the state of [place].  ? Intervention/Plan: Samuel Ewing administered a PHQ-9 pt scored a twenty-four goal/objective for pt is below a ten. Samuel Ewing administered a GAD-7 and pt score a sixteen goal/objective is below a five. Samuel Ewing educated pt on score. Samuel Ewing spoke with pt on referral to [facility] and to [place] for support groups. Pt was agreeable to plan moving forward.  ?Plan: Return again in 4 weeks. ? ?Diagnosis: Moderate episode of recurrent major depressive disorder ([place]) ? ?Generalized anxiety disorder ? ?Collaboration of Care: Other Referral to PHP and [facility] for Support Groups  ? ?Patient/Guardian was advised Release of Information must be obtained prior to any record release in order to collaborate their care with an outside provider. Patient/Guardian was advised if they have not already done so to contact the registration department to sign all necessary forms in order for [place] to release information regarding their care.  ? ?Consent: Patient/Guardian gives verbal consent for treatment and assignment of benefits for services provided during this visit. Patient/Guardian expressed understanding and agreed to proceed.  ? ?Dory Horn, Samuel Ewing ?09/21/2021 ? ?

## 2021-09-21 NOTE — Plan of Care (Signed)
Pt agreeable to plan  ?

## 2021-09-23 ENCOUNTER — Other Ambulatory Visit: Payer: Self-pay

## 2021-09-23 ENCOUNTER — Other Ambulatory Visit (HOSPITAL_COMMUNITY): Payer: 59 | Attending: Psychiatry | Admitting: Professional

## 2021-09-23 DIAGNOSIS — F411 Generalized anxiety disorder: Secondary | ICD-10-CM

## 2021-09-23 DIAGNOSIS — F3181 Bipolar II disorder: Secondary | ICD-10-CM

## 2021-09-23 NOTE — Psych (Signed)
Virtual Visit via Video Note ? ?I connected with Samuel Ewing on 09/23/21 at 11:00 AM EDT by a video enabled telemedicine application and verified that I am speaking with the correct person using two identifiers. ? ?Location: ?Patient: home ?Provider: clinical home office ?  ?I discussed the limitations of evaluation and management by telemedicine and the availability of in person appointments. The patient expressed understanding and agreed to proceed. ? ? ?Follow Up Instructions: ? ?  ?I discussed the assessment and treatment plan with the patient. The patient was provided an opportunity to ask questions and all were answered. The patient agreed with the plan and demonstrated an understanding of the instructions. ?  ?The patient was advised to call back or seek an in-person evaluation if the symptoms worsen or if the condition fails to improve as anticipated. ? ?I provided 60 minutes of non-face-to-face time during this encounter. ? ? ?Quinn Axe, [facility] ? ? ? ? ?Comprehensive Clinical Assessment (CCA) Note ? ?09/23/2021 ?Samuel Ewing ?[id number] ? ?Chief Complaint:  ?Chief Complaint  ?Patient presents with  ? Depression  ? Anxiety  ? ?Visit Diagnosis: Bipolar; GAD  ? ? ?CCA Screening, Triage and Referral (STR) ? ?Patient Reported Information ?How did you hear about [place]? Other (Comment) ? ?Referral name: Christella Scheuermann at [facility] ? ?Referral phone number: No data recorded ? ?Whom do you see for routine medical problems? Primary Care ? ?Practice/Facility Name: [facility] ? ?Practice/Facility Phone Number: No data recorded ?Name of Contact: No data recorded ?Contact Number: No data recorded ?Contact Fax Number: No data recorded ?Prescriber Name: No data recorded ?Prescriber Address (if known): No data recorded ? ?What Is the Reason for Your Visit/Call Today? depression and anxiety ? ?How Long Has This Been Causing You Problems? > than 6 months ? ?What Do You Feel Would Help You the Most Today? Treatment for  Depression or other mood problem ? ? ?Have You Recently Been in Any Inpatient Treatment (Hospital/Detox/Crisis Center/28-Day Program)? No ? ?Name/Location of Program/Hospital:No data recorded ?How Long Were You There? No data recorded ?When Were You Discharged? No data recorded ? ?Have You Ever Received Services From [facility] Before? Yes ? ?Who Do You See at [facility]? No data recorded ? ?Have You Recently Had Any Thoughts About Hurting Yourself? No ? ?Are You Planning to Commit Suicide/Harm Yourself At This time? No ? ? ?Have you Recently Had Thoughts About Hurting Someone [name]? No ? ?Explanation: No data recorded ? ?Have You Used Any Alcohol or Drugs in the Past 24 Hours? No ? ?How Long Ago Did You Use Drugs or Alcohol? No data recorded ?What Did You Use and How Much? He says he smokes marijuana "once in awhile."  Last use 2-4 days ago. ? ? ?Do You Currently Have a Therapist/Psychiatrist? Yes ? ?Name of Therapist/Psychiatrist: Christella Scheuermann therapy: for 2 months; Psychiatrist: Gretchen Short for over a year ? ? ?Have You Been Recently Discharged From Any Office Practice or Programs? No ? ?Explanation of Discharge From Practice/Program: No data recorded ? ?  ?CCA Screening Triage Referral Assessment ?Type of Contact: Tele-Assessment ? ?Is this Initial or Reassessment? Initial Assessment ? ?Date Telepsych consult ordered in CHL:  No data recorded ?Time Telepsych consult ordered in CHL:  No data recorded ? ?Patient Reported Information Reviewed? No data recorded ?Patient Left Without Being Seen? No data recorded ?Reason for Not Completing Assessment: No data recorded ? ?Collateral Involvement: notes ? ? ?Does Patient Have a Automotive engineer  Guardian? No data recorded ?Name and Contact of Legal Guardian: No data recorded ?If Minor and Not Living with Parent(s), Who has Custody? No data recorded ?Is CPS involved or ever been involved? Never ? ?Is APS involved or ever been involved? Never ? ? ?Patient Determined To  Be At Risk for Harm To Self or Others Based on Review of Patient Reported Information or Presenting Complaint? No ? ?Method: No data recorded ?Availability of Means: No data recorded ?Intent: No data recorded ?Notification Required: No data recorded ?Additional Information for Danger to Others Potential: No data recorded ?Additional Comments for Danger to Others Potential: No data recorded ?Are There Guns or Other Weapons in Your Home? No data recorded ?Types of Guns/Weapons: No data recorded ?Are These Weapons Safely Secured?                            No data recorded ?Who Could Verify You Are Able To Have These Secured: No data recorded ?Do You Have any Outstanding Charges, Pending Court Dates, Parole/Probation? No data recorded ?Contacted To Inform of Risk of Harm To Self or Others: No data recorded ? ?Location of Assessment: Other (comment) ? ? ?Does Patient Present under Involuntary Commitment? No ? ?IVC Papers Initial File Date: No data recorded ? ?[place] of Residence: [place] ? ? ?Patient Currently Receiving the Following Services: Individual Therapy; Medication Management ? ? ?Determination of Need: Urgent (48 hours) ? ? ?Options For Referral: Partial Hospitalization ? ? ? ? ?CCA Biopsychosocial ?Intake/Chief Complaint:  Pt reports to PHP per referral from therapist Christella Scheuermann. Pt reports current stressors include 1) job: Pt reports he recently lost job (08/28/21) which has brought on depression and anxiety. Pt is currently searching for new job which is stressful. 2) MH: Pt reports ?I have really bad anxiety and it comes out of no where.? 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 Dilaudid Pt reports he has support from family and friends; no support groups. Pt reports he is on suboxone which helps with his cravings. 3) Recent divorce from husband. Treatment History: [facility] for therapy 2x. Psychiatry with Gretchen Short for over 1 year. Therapy and psychiatry since age 5  (off/on). Pt denies any group therapy in past. Pt reports 1 hospitalization for SI around 2016. Pt denies any suicide attempts or NSSIB. Pt reports history of depression, anxiety, and Bipolar 2- last hypomanic episode was 5 years ago. Pt reports current passive SI, denies HI/AVH. Pt reports medical diagnosis include high blood pressure, low thyroid, and constant neck pain- pt has cervical surgery in 09/2020. Pt identifies supports as Mom and best friend. ? ?Current Symptoms/Problems: "feeling low and down"; worthlessness; passive SI; anxiety attacks (pt reports ?nothing stops them? and he is having them daily, last about 2-3 hours, for about 1.5 years); decreased ADLs: cooking, showering; appetite: increased- stress eating- gaining weight; sleep: decreased - abt 4 hours a night; hopelessness; irritability; mood swings; decreased concentration; decreased energy; anhedonia; ? ? ?Patient Reported Schizophrenia/Schizoaffective Diagnosis in Past: No ? ? ?Strengths: willing to engage in treatment ? ?Preferences: to feel better and manage anxiety and depression ? ?Abilities: muscical instrauments ? ? ?Type of Services Patient Feels are Needed: PHP ? ? ?Initial Clinical Notes/Concerns: passive suicdal ideations ? ? ?Mental Health Symptoms ?Depression:   ?Difficulty Concentrating; Fatigue; Hopelessness; Increase/decrease in appetite; Irritability; Sleep (too much or little); Tearfulness; Weight gain/loss; Worthlessness; Change in energy/activity ?  ?Duration of Depressive  symptoms:  ?Greater than two weeks ?  ?Mania:   ?Racing thoughts ?  ?Anxiety:    ?Tension; Worrying; Irritability; Restlessness ?  ?Psychosis:   ?None ?  ?Duration of Psychotic symptoms: No data recorded  ?Trauma:   ?None ?  ?Obsessions:   ?None ?  ?Compulsions:   ?None ?  ?Inattention:   ?None ?  ?Hyperactivity/Impulsivity:   ?None ?  ?Oppositional/Defiant Behaviors:   ?None ?  ?Emotional Irregularity:   ?None ?  ?Other Mood/Personality Symptoms:  No data  recorded  ? ?Mental Status Exam ?Appearance and self-care  ?Stature:   ?Average ?  ?Weight:   ?Average weight ?  ?Clothing:   ?Casual ?  ?Grooming:   ?Neglected ?  ?Cosmetic use:   ?None ?  ?Posture/gait:   ?N

## 2021-09-28 ENCOUNTER — Other Ambulatory Visit (HOSPITAL_COMMUNITY): Payer: 59

## 2021-09-28 ENCOUNTER — Ambulatory Visit (HOSPITAL_COMMUNITY): Payer: 59

## 2021-09-29 ENCOUNTER — Ambulatory Visit (HOSPITAL_COMMUNITY): Payer: 59

## 2021-09-29 ENCOUNTER — Other Ambulatory Visit (HOSPITAL_COMMUNITY): Payer: 59

## 2021-09-30 ENCOUNTER — Ambulatory Visit (HOSPITAL_COMMUNITY): Payer: 59

## 2021-09-30 ENCOUNTER — Other Ambulatory Visit (HOSPITAL_COMMUNITY): Payer: 59

## 2021-10-01 ENCOUNTER — Ambulatory Visit (HOSPITAL_COMMUNITY): Payer: 59

## 2021-10-01 ENCOUNTER — Ambulatory Visit: Payer: 59 | Attending: Internal Medicine | Admitting: Pharmacist

## 2021-10-01 DIAGNOSIS — Z23 Encounter for immunization: Secondary | ICD-10-CM

## 2021-10-01 NOTE — Progress Notes (Signed)
Patient presents for vaccination against zoster per orders of Dr. Johnson. Consent given. Counseling provided. No contraindications exists. Vaccine administered without incident.   Luke Van Ausdall, PharmD, BCACP, CPP Clinical Pharmacist [facility] [phone]  

## 2021-10-02 ENCOUNTER — Ambulatory Visit (HOSPITAL_COMMUNITY): Payer: 59

## 2021-10-02 ENCOUNTER — Other Ambulatory Visit (HOSPITAL_COMMUNITY): Payer: 59

## 2021-10-05 ENCOUNTER — Other Ambulatory Visit (HOSPITAL_COMMUNITY): Payer: 59

## 2021-10-05 ENCOUNTER — Ambulatory Visit (HOSPITAL_COMMUNITY): Payer: 59

## 2021-10-06 ENCOUNTER — Other Ambulatory Visit (HOSPITAL_COMMUNITY): Payer: Self-pay

## 2021-10-06 ENCOUNTER — Ambulatory Visit (HOSPITAL_COMMUNITY): Payer: Self-pay

## 2021-10-06 ENCOUNTER — Telehealth (HOSPITAL_COMMUNITY): Payer: Self-pay | Admitting: Licensed Clinical Social Worker

## 2021-10-07 ENCOUNTER — Other Ambulatory Visit (HOSPITAL_COMMUNITY): Payer: Self-pay

## 2021-10-07 ENCOUNTER — Telehealth (INDEPENDENT_AMBULATORY_CARE_PROVIDER_SITE_OTHER): Payer: Self-pay | Admitting: Psychiatry

## 2021-10-07 ENCOUNTER — Ambulatory Visit (HOSPITAL_COMMUNITY): Payer: Self-pay

## 2021-10-07 DIAGNOSIS — F411 Generalized anxiety disorder: Secondary | ICD-10-CM | POA: Diagnosis not present

## 2021-10-07 MED ORDER — DIAZEPAM 2 MG PO TABS: 2.0000 mg | ORAL_TABLET | Freq: Every day | ORAL | 0 refills | Status: DC | PRN

## 2021-10-07 NOTE — Progress Notes (Signed)
BH MD/PA/NP OP Progress Note ? ?10/07/2021 6:34 PM ?Samuel Ewing  ?MRN:  [id number] ? ?Virtual Visit via Telephone Note ? ?I connected with Samuel Ewing on 10/07/21 at  2:30 PM EDT by telephone and verified that I am speaking with the correct person using two identifiers. ? ?Location: ?Patient: Home ?Provider: Offsite ?  ?I discussed the limitations, risks, security and privacy concerns of performing an evaluation and management service by telephone and the availability of in person appointments. I also discussed with the patient that there may be a patient responsible charge related to this service. The patient expressed understanding and agreed to proceed. ? ?  ?I discussed the assessment and treatment plan with the patient. The patient was provided an opportunity to ask questions and all were answered. The patient agreed with the plan and demonstrated an understanding of the instructions. ?  ?The patient was advised to call back or seek an in-person evaluation if the symptoms worsen or if the condition fails to improve as anticipated. ? ?I provided 10 minutes of non-face-to-face time during this encounter. ? ? ?Franne Grip, NP  ? ?Chief Complaint: Medication management ? ?HPI: Samuel Ewing is a 51 year old male presenting to [facility] behavioral health outpatient for a follow-up psychiatric evaluation.  Patient has a psychiatric history of bipolar disorder, major depressive disorder and generalized anxiety disorder.  Patient symptoms are managed with Geodon 60 mg twice daily with meals, Cymbalta 60 mg twice daily, and BuSpar 30 mg twice daily.  Patient reports medication compliance and denies adverse medication effects.  Patient was previously prescribed Valium 5 mg which was discontinued at last visit due to a plan for down taper.  Patient reports that he has used his remaining doses and continued to take Valium 5 mg due to continued panic attacks.  Patient reports that he seldom needs the  Valium at this point but feels like he is unable to calm himself during panic attacks at times.  New order for Valium 2 mg as needed ordered.  Patient denies the need for refills on current medications. ? ? ?Visit Diagnosis:  ?  ICD-10-CM   ?1. Generalized anxiety disorder  F41.1   ?  ? ? ?Past Psychiatric History:  ? ?Past Medical History:  ?Past Medical History:  ?Diagnosis Date  ? Anxiety   ? Depression   ? Hypertension   ? Migraine   ? Opiate dependence, continuous ([place])   ? Opiate withdrawal (HCC)   ? Pneumonia 11/14/2020  ?  ?Past Surgical History:  ?Procedure Laterality Date  ? BACK SURGERY    ? CERVICAL SPINE SURGERY    ? ? ?Family Psychiatric History: n/a ? ?Family History:  ?Family History  ?Problem Relation Age of Onset  ? Heart failure Mother   ? Heart failure Father   ? Heart attack Father   ? ? ?Social History:  ?Social History  ? ?Socioeconomic History  ? Marital status: Single  ?  Spouse name: Not on file  ? Number of children: Not on file  ? Years of education: Not on file  ? Highest education level: Not on file  ?Occupational History  ? Not on file  ?Tobacco Use  ? Smoking status: Former  ?  Types: Cigarettes, E-cigarettes  ?  Start date: 56  ?  Quit date: 03/17/2018  ?  Years since quitting: 3.5  ? Smokeless tobacco: Never  ? Tobacco comments:  ?  Smoked 2-3 cigarettes per day when smoking.  ?  Currently vaping 1 cartridge per 2 days.  ?Vaping Use  ? Vaping Use: Every day  ? Start date: 06/28/2017  ? Substances: Nicotine  ?Substance and Sexual Activity  ? Alcohol use: Never  ? Drug use: Yes  ?  Types: Marijuana  ? Sexual activity: Not on file  ?Other Topics Concern  ? Not on file  ?Social History Narrative  ? Not on file  ? ?Social Determinants of Health  ? ?Financial Resource Strain: Not on file  ?Food Insecurity: Not on file  ?Transportation Needs: Not on file  ?Physical Activity: Not on file  ?Stress: Not on file  ?Social Connections: Not on file  ? ? ?Allergies:  ?Allergies  ?Allergen Reactions   ? Morphine And Related   ? ? ?Metabolic Disorder Labs: ?Lab Results  ?Component Value Date  ? HGBA1C 5.7 (H) 08/03/2021  ? MPG 136.98 10/19/2020  ? ?No results found for: PROLACTIN ?Lab Results  ?Component Value Date  ? CHOL 117 08/03/2021  ? TRIG 117 08/03/2021  ? HDL 50 08/03/2021  ? CHOLHDL 2.3 08/03/2021  ? LDLCALC 46 08/03/2021  ? [place] 59 06/18/2020  ? ?Lab Results  ?Component Value Date  ? TSH 1.340 08/03/2021  ? TSH 0.431 10/20/2020  ? ? ?Therapeutic Level Labs: ?No results found for: LITHIUM ?No results found for: VALPROATE ?No components found for:  CBMZ ? ?Current Medications: ?Current Outpatient Medications  ?Medication Sig Dispense Refill  ? Accu-Chek Softclix Lancets lancets Use as instructed 100 each 12  ? albuterol (VENTOLIN HFA) 108 (90 Base) MCG/ACT inhaler Inhale 2 puffs into the lungs every 4 (four) hours as needed for wheezing or shortness of breath. (Patient not taking: Reported on 08/07/2021) 8 g 2  ? amLODipine (NORVASC) 10 MG tablet Take 1 tablet (10 mg total) by mouth daily. 90 tablet 1  ? atorvastatin (LIPITOR) 20 MG tablet Take 1 tablet (20 mg total) by mouth daily. 90 tablet 1  ? Blood Glucose Monitoring Suppl (ACCU-CHEK GUIDE ME) w/Device KIT Check BS in Q a.m before breakfast 1 kit 0  ? busPIRone (BUSPAR) 30 MG tablet Take 1 tablet (30 mg total) by mouth 2 (two) times daily. 60 tablet 3  ? diazepam (VALIUM) 5 MG tablet Take 1 tablet by mouth once daily 30 tablet 0  ? DULoxetine (CYMBALTA) 60 MG capsule Take 1 capsule (60 mg total) by mouth 2 (two) times daily. 60 capsule 3  ? gabapentin (NEURONTIN) 600 MG tablet Take 600 mg by mouth 2 (two) times daily. For pain    ? HYDROmorphone (DILAUDID) 4 MG tablet Take 8 mg by mouth every 6 (six) hours as needed for severe pain.    ? influenza vac split quadrivalent PF (FLUARIX) 0.5 ML injection Inject into the muscle. 0.5 mL 0  ? levothyroxine (SYNTHROID) 50 MCG tablet Take 1 tablet (50 mcg total) by mouth daily. 90 tablet 1  ? lidocaine  (LIDODERM) 5 % Place 1 patch onto the skin daily. Remove & Discard patch within 12 hours or as directed by MD (Patient not taking: Reported on 08/07/2021) 30 patch 0  ? lisinopril (ZESTRIL) 20 MG tablet Take 1 tablet (20 mg total) by mouth daily. 90 tablet 1  ? promethazine (PHENERGAN) 12.5 MG tablet Take 12.5 mg by mouth every 6 (six) hours as needed for nausea or vomiting. (Patient not taking: Reported on 08/07/2021)    ? propranolol (INDERAL) 20 MG tablet Take 1 tablet (20 mg total) by mouth 3 (three) times daily. 90 tablet  4  ? SUMAtriptan (IMITREX) 100 MG tablet Take 100 mg by mouth daily as needed for migraine. May repeat in 2 hours if headache persists or recurs.    ? ziprasidone (GEODON) 60 MG capsule Take 1 capsule (60 mg total) by mouth 2 (two) times daily with a meal. 60 capsule 3  ? ?No current facility-administered medications for this visit.  ? ? ? ?Musculoskeletal: ?Strength & Muscle Tone:  n/a virtual visit ?Gait & Station:  n/a ?Patient leans: N/A ? ?Psychiatric Specialty Exam: ?Review of Systems  ?Psychiatric/Behavioral:  Negative for hallucinations, self-injury and suicidal ideas. The patient is nervous/anxious.   ?All other systems reviewed and are negative.  ?There were no vitals taken for this visit.There is no height or weight on file to calculate BMI.  ?General Appearance: NA  ?Eye Contact:  NA  ?Speech:  Clear and Coherent  ?Volume:  Normal  ?Mood:  Anxious  ?Affect:  NA  ?Thought Process:  Goal Directed  ?Orientation:  Full (Time, Place, and Person)  ?Thought Content: Logical   ?Suicidal Thoughts:  No  ?Homicidal Thoughts:  No  ?Memory: Good  ?Judgement:  Good  ?Insight:  Good  ?Psychomotor Activity:  NA  ?Concentration: Good  ?Recall:  Good  ?Fund of Knowledge: Good  ?Language: Good  ?Akathisia:  NA  ?Handed:  Right  ?AIMS (if indicated): not done  ?Assets:  Communication Skills ?Desire for Improvement  ?ADL's:  Intact  ?Cognition: WNL  ?Sleep:  Good  ? ?Screenings: ?GAD-7   ? ?Flowsheet Row  Video Visit from 06/16/2021 in [facility] Video Visit from 03/11/2021 in [facility] Counselor from 12/18/2020 in [facility]

## 2021-10-08 ENCOUNTER — Other Ambulatory Visit (HOSPITAL_COMMUNITY): Payer: Self-pay

## 2021-10-08 ENCOUNTER — Ambulatory Visit (HOSPITAL_COMMUNITY): Payer: Self-pay

## 2021-10-08 ENCOUNTER — Ambulatory Visit (INDEPENDENT_AMBULATORY_CARE_PROVIDER_SITE_OTHER): Payer: Commercial Managed Care - HMO | Admitting: Licensed Clinical Social Worker

## 2021-10-08 DIAGNOSIS — F411 Generalized anxiety disorder: Secondary | ICD-10-CM

## 2021-10-08 NOTE — Progress Notes (Signed)
? ?THERAPIST PROGRESS NOTE ? ?Virtual Visit via Video Note ? ?I connected with Samuel Ewing on 10/08/21 at  1:00 PM EDT by a video enabled telemedicine application and verified that I am speaking with the correct person using two identifiers. ? ?Location: ?Patient: [facility]  ?Provider: Providers Home  ?  ?I discussed the limitations of evaluation and management by telemedicine and the availability of in person appointments. The patient expressed understanding and agreed to proceed. ? ? ?  ?I discussed the assessment and treatment plan with the patient. The patient was provided an opportunity to ask questions and all were answered. The patient agreed with the plan and demonstrated an understanding of the instructions. ?  ?The patient was advised to call back or seek an in-person evaluation if the symptoms worsen or if the condition fails to improve as anticipated. ? ?I provided 30 minutes of non-face-to-face time during this encounter. ? ? ?[name], LCSW  ? ?Participation Level: Active ? ?Behavioral Response: CasualAlertAnxious and Depressed ? ?Type of Therapy: Individual Therapy ? ?Treatment Goals addressed:  ? ?ProgressTowards Goals: Progressing ? ?Interventions: Motivational Interviewing and Supportive ? ? ? ?Suicidal/Homicidal: Nowithout intent/plan ? ?Therapist Response:  ? ?  ?Pt was alert and oriented x 5. He presented with slowed and lethargic mood/affect. He was pleasant, cooperative, and maintained good eye contact. He engaged well in was dressed casually.  ? LCSW started off session talk about him not f/u or communicating with [employer]. Pt reported that he has not called them back because he is supposed to interview in [place], but states that the interview will not happen until May. LCSW notified pt that communication is key for maintaining program standards and it is his responsibility to keep that line of communication open. Pt reported understanding that as of today he was  discharged from [facility]. Pt reports that he did do an intake with [employer] [employer], but they have not f/u with him since. LCSW advocated for pt to f/u via email moving forward and to call if they do not f/u by the end of this week. LCSW also spoke with pt about having [employer] in the system. Pt reports that he did not get a letter stating that a claim was denied for [facility]. LCSW explained that it is the patient responsibility to update insurance information, and if it is not updated by next session than session could not be conducted.  ? Intervention/Plan: LCSW administered a GAD-7 and pt scored a seventeen. Goal/objective is below a five. LCSW administered a PHQ-9 pt decreased score from a 21 to 9 from 2 weeks ago. Goal/objective for is below a five. LCSW provided pt with resources for f/u to update insurance and to f/u for groups with [employer]. LCSW used encouragement and praise for supportive therapy in today's session.  ?Plan: Return again in 4 weeks. ? ?Diagnosis: No diagnosis found. ? ?Collaboration of Care: Other Pt to f/u  [employer] about group support session. Pt was discharged from [facility] due to inadequate   communication with group leaders.  ? ?Patient/Guardian was advised Release of Information must be obtained prior to any record release in order to collaborate their care with an outside provider. Patient/Guardian was advised if they have not already done so to contact the registration department to sign all necessary forms in order for [place] to release information regarding their care.  ? ?Consent: Patient/Guardian gives verbal consent for treatment and assignment of benefits for services provided during this visit.  Patient/Guardian expressed understanding and agreed to proceed.  ? ?[name], LCSW ?10/08/2021 ? ?

## 2021-10-09 ENCOUNTER — Other Ambulatory Visit (HOSPITAL_COMMUNITY): Payer: Self-pay

## 2021-10-09 ENCOUNTER — Ambulatory Visit (HOSPITAL_COMMUNITY): Payer: Self-pay

## 2021-10-12 ENCOUNTER — Other Ambulatory Visit (HOSPITAL_COMMUNITY): Payer: Self-pay

## 2021-10-12 ENCOUNTER — Ambulatory Visit (HOSPITAL_COMMUNITY): Payer: Self-pay

## 2021-10-13 ENCOUNTER — Ambulatory Visit (HOSPITAL_COMMUNITY): Payer: Self-pay

## 2021-10-13 ENCOUNTER — Other Ambulatory Visit (HOSPITAL_COMMUNITY): Payer: Self-pay

## 2021-10-14 ENCOUNTER — Ambulatory Visit (HOSPITAL_COMMUNITY): Payer: Self-pay

## 2021-10-14 ENCOUNTER — Other Ambulatory Visit (HOSPITAL_COMMUNITY): Payer: Self-pay

## 2021-10-15 ENCOUNTER — Ambulatory Visit (HOSPITAL_COMMUNITY): Payer: Self-pay

## 2021-10-15 ENCOUNTER — Other Ambulatory Visit (HOSPITAL_COMMUNITY): Payer: Self-pay

## 2021-10-16 ENCOUNTER — Ambulatory Visit (HOSPITAL_COMMUNITY): Payer: Self-pay

## 2021-10-16 ENCOUNTER — Other Ambulatory Visit (HOSPITAL_COMMUNITY): Payer: Self-pay

## 2021-10-19 ENCOUNTER — Ambulatory Visit (HOSPITAL_COMMUNITY): Payer: Self-pay

## 2021-10-19 ENCOUNTER — Other Ambulatory Visit (HOSPITAL_COMMUNITY): Payer: Self-pay

## 2021-11-03 ENCOUNTER — Other Ambulatory Visit: Payer: Self-pay | Admitting: Internal Medicine

## 2021-11-03 ENCOUNTER — Other Ambulatory Visit (HOSPITAL_COMMUNITY): Payer: Self-pay | Admitting: Psychiatry

## 2021-11-03 DIAGNOSIS — I10 Essential (primary) hypertension: Secondary | ICD-10-CM

## 2021-11-03 DIAGNOSIS — F411 Generalized anxiety disorder: Secondary | ICD-10-CM

## 2021-11-03 DIAGNOSIS — E039 Hypothyroidism, unspecified: Secondary | ICD-10-CM

## 2021-11-03 DIAGNOSIS — F3130 Bipolar disorder, current episode depressed, mild or moderate severity, unspecified: Secondary | ICD-10-CM

## 2021-11-03 NOTE — Telephone Encounter (Signed)
Copied from [place]. Topic: General - Other ?>> Nov 03, 2021  4:19 PM Tessa Lerner A wrote: ?Reason for CRM: Medication Refill - Medication: lisinopril (ZESTRIL) 20 MG tablet [id number]  ? ?levothyroxine (SYNTHROID) 50 MCG tablet [id number]  ? ?Has the patient contacted their pharmacy? No.The patient was uncertain of the medication's status  ?(Agent: If no, request that the patient contact the pharmacy for the refill. If patient does not wish to contact the pharmacy document the reason why and proceed with request.) ?(Agent: If yes, when and what did the pharmacy advise?) ? ?Preferred Pharmacy (with phone number or street name): [place], [place] - [id number] ?[place] [place] [address] ?Phone: [phone] Fax: [phone] ?Hours: Not open 24 hours ? ?Has the patient been seen for an appointment in the last year OR does the patient have an upcoming appointment? Yes.   ? ?Agent: Please be advised that RX refills may take up to 3 business days. We ask that you follow-up with your pharmacy. ?

## 2021-11-04 NOTE — Telephone Encounter (Signed)
Medication was refilled 11/04/21 by pharmacist. Will refuse this duplicate request. ? ?Requested Prescriptions  ?Pending Prescriptions Disp Refills  ?? lisinopril (ZESTRIL) 20 MG tablet 90 tablet 1  ?  Sig: Take 1 tablet (20 mg total) by mouth daily.  ?  ? Cardiovascular:  ACE Inhibitors Passed - 11/04/2021  9:56 AM  ?  ?  Passed - Cr in normal range and within 180 days  ?  Creatinine, Ser  ?Date Value Ref Range Status  ?08/03/2021 0.91 0.76 - 1.27 mg/dL Final  ?   ?  ?  Passed - K in normal range and within 180 days  ?  Potassium  ?Date Value Ref Range Status  ?08/03/2021 4.9 3.5 - 5.2 mmol/L Final  ?   ?  ?  Passed - Patient is not pregnant  ?  ?  Passed - Last BP in normal range  ?  BP Readings from Last 1 Encounters:  ?08/12/21 108/71  ?   ?  ?  Passed - Valid encounter within last 6 months  ?  Recent Outpatient Visits   ?      ? 1 month ago Need for shingles vaccine  ? [facility] Lois Huxley, Cornelius Moras, RPH-CPP  ? 2 months ago Essential hypertension  ? [facility] Lois Huxley, Cornelius Moras, RPH-CPP  ? 2 months ago Essential hypertension  ? [facility] [place], Jomarie Longs, [place]  ? 3 months ago Essential hypertension  ? [facility] Marcine Matar, MD  ? 6 months ago Essential hypertension  ? [facility] Marcine Matar, MD  ?  ?  ?Future Appointments   ?        ? In 4 weeks Marcine Matar, MD [facility]  ?  ? ?  ?  ?  ?? levothyroxine (SYNTHROID) 50 MCG tablet 90 tablet 1  ?  Sig: Take 1 tablet (50 mcg total) by mouth daily.  ?  ? Endocrinology:  Hypothyroid Agents Passed - 11/04/2021  9:56 AM  ?  ?  Passed - TSH in normal range and within 360 days  ?  TSH  ?Date Value Ref Range Status  ?08/03/2021 1.340 0.450 - 4.500 uIU/mL Final  ?   ?  ?  Passed - Valid encounter within last 12 months  ?  Recent Outpatient Visits   ?      ? 1  month ago Need for shingles vaccine  ? [facility] Lois Huxley, Cornelius Moras, RPH-CPP  ? 2 months ago Essential hypertension  ? [facility] Lois Huxley, Cornelius Moras, RPH-CPP  ? 2 months ago Essential hypertension  ? [facility] [place], Jomarie Longs, [place]  ? 3 months ago Essential hypertension  ? [facility] Marcine Matar, MD  ? 6 months ago Essential hypertension  ? [facility] Marcine Matar, MD  ?  ?  ?Future Appointments   ?        ? In 4 weeks Marcine Matar, MD [facility]  ?  ? ?  ?  ?  ? ? ?

## 2021-11-06 ENCOUNTER — Ambulatory Visit (INDEPENDENT_AMBULATORY_CARE_PROVIDER_SITE_OTHER): Payer: Self-pay | Admitting: Licensed Clinical Social Worker

## 2021-11-06 DIAGNOSIS — F411 Generalized anxiety disorder: Secondary | ICD-10-CM

## 2021-11-06 DIAGNOSIS — F331 Major depressive disorder, recurrent, moderate: Secondary | ICD-10-CM

## 2021-11-06 NOTE — Progress Notes (Signed)
? ?  THERAPIST PROGRESS NOTE ? ?Virtual Visit via Video Note ? ?I connected with Samuel Ewing on 11/06/21 at 10:00 AM EDT by a video enabled telemedicine application and verified that I am speaking with the correct person using two identifiers. ? ?Location: ?Patient: Samuel Ewing  ?Provider: Providers Home  ?  ?I discussed the limitations of evaluation and management by telemedicine and the availability of in person appointments. The patient expressed understanding and agreed to proceed. ? ? ?  ?I discussed the assessment and treatment plan with the patient. The patient was provided an opportunity to ask questions and all were answered. The patient agreed with the plan and demonstrated an understanding of the instructions. ?  ?The patient was advised to call back or seek an in-person evaluation if the symptoms worsen or if the condition fails to improve as anticipated. ? ?I provided 30 minutes of non-face-to-face time during this encounter. ? ? ?Samuel Horn, LCSW  ? ?Participation Level: Active ? ?Behavioral Response: CasualAlertAnxious and Depressed ? ?Type of Therapy: Individual Therapy ? ?Treatment Goals addressed: decrease PHQ-9 below 10  ? ?ProgressTowards Goals: Progressing ? ?Interventions: CBT and Supportive ? ? ?Suicidal/Homicidal: Nowithout intent/plan ? ?Therapist Response:  ? ? ? ?Pt was alert and oriented x 5. Pt was pleasant, cooperative, and maintained good eye contact. He presented with depressed and anxious mood/affect. He engaged well in therapy session and was dressed casually.  ? Pt reports that everything has been going well. Samuel Ewing states he has some passive suicidal ideations. Pt reports to plan or intent. He reports that he is still interviewing for jobs out of state. He has made it to the 2n round of interview for 1 job. In the meantime, pt will be moving in with his mother in [place]. Pt reports this will be his last appointment with this LCSW as he is moving June 1st.   ? Intervention/Plan: LCSW administered PHQ-9, GAD-9 and [place] severity rating scale. LCSW educated pt on PHQ-9 and GAD-7 score. LCSW completed safety risk plan with pt. Plan for pt is to move and find care out of state and in new city.  ? ? ?Diagnosis: Generalized anxiety disorder ? ?Moderate episode of recurrent major depressive disorder ([place]) ? ?Collaboration of Care: Other None in today  session  ? ?Patient/Guardian was advised Release of Information must be obtained prior to any record release in order to collaborate their care with an outside provider. Patient/Guardian was advised if they have not already done so to contact the registration department to sign all necessary forms in order for [place] to release information regarding their care.  ? ?Consent: Patient/Guardian gives verbal consent for treatment and assignment of benefits for services provided during this visit. Patient/Guardian expressed understanding and agreed to proceed.  ? ?Samuel Horn, LCSW ?11/06/2021 ? ?

## 2021-12-03 ENCOUNTER — Ambulatory Visit: Payer: Commercial Managed Care - HMO | Attending: Internal Medicine | Admitting: Internal Medicine

## 2021-12-03 ENCOUNTER — Encounter: Payer: Self-pay | Admitting: Internal Medicine

## 2021-12-03 VITALS — BP 87/56 | HR 65 | Temp 98.1°F | Resp 16 | Wt 187.2 lb

## 2021-12-03 DIAGNOSIS — R7303 Prediabetes: Secondary | ICD-10-CM

## 2021-12-03 DIAGNOSIS — I10 Essential (primary) hypertension: Secondary | ICD-10-CM | POA: Diagnosis not present

## 2021-12-03 DIAGNOSIS — E039 Hypothyroidism, unspecified: Secondary | ICD-10-CM

## 2021-12-03 DIAGNOSIS — E663 Overweight: Secondary | ICD-10-CM | POA: Diagnosis not present

## 2021-12-03 DIAGNOSIS — Z23 Encounter for immunization: Secondary | ICD-10-CM | POA: Diagnosis not present

## 2021-12-03 DIAGNOSIS — E782 Mixed hyperlipidemia: Secondary | ICD-10-CM

## 2021-12-03 DIAGNOSIS — F411 Generalized anxiety disorder: Secondary | ICD-10-CM

## 2021-12-03 NOTE — Progress Notes (Signed)
Patient ID: Samuel Ewing, male    DOB: 03-04-1971  MRN: [id number]  CC: Hypertension   Subjective: Samuel Ewing is a 51 y.o. male who presents for chronic ds management His concerns today include:  Patient with history of HTN, HL, migraines, anxiety disorder, hypothyroidism, family history of heart disease in both parents, parainfluenza pneumonia 09/2020, preDM (while on steroids for pneumonia), normocytic anemia with iron studies c/w ACD  Did not get good sleep last night due to having a bad anxiety attack Still followed by behavioral health and reports compliance with taking his medications that include BuSpar, diazepam, Cymbalta, Geodon. Still on Saboxone after being weaned off Dilaudid.  HTN: BP a little low today.  He is on Norvasc 10 mg daily, propranolol 20 mg daily and lisinopril 20 mg daily Checks BP once a day.  Reports nl range.  No dizziness.  Limits salt. HL: Reports compliance with atorvastatin 20 mg daily. Thyroid: Compliant with taking his levothyroxine 50 mcg daily.  Last TSH done several months ago was normal at 1.34.  Overwgh/PreDM: Weight is up 18 pounds since her last visit with me 4 months ago.  Exercising 3 x/wk -does cardio walking and jogging. Feels he does well with eating habits. Not too much red meat, drinks mainly water  Sometimes he notices a little redness around the ankles when he pulls down his socks.  Not there today.  Has itching at times but attributes that to dry skin.  HM: showing due for colonoscopy; reports having had colonoscopy 2 yrs ago in [place].  Due for Tdapt, agrees for vaccine today.    Patient Active Problem List   Diagnosis Date Noted   Bipolar 2 disorder (HCC) 08/03/2021   Bipolar I disorder, most recent episode depressed (HCC) 06/16/2021   OSA (obstructive sleep apnea) 03/19/2021   Nocturnal hypoxia 03/19/2021   Physical deconditioning 10/22/2020   Pneumonia of both lungs due to infectious organism 10/19/2020    Prediabetes 10/19/2020   Mild episode of recurrent major depressive disorder (HCC) 06/09/2020   Essential hypertension 06/06/2020   Hyperlipidemia 06/06/2020   Migraine with aura and without status migrainosus, not intractable 06/06/2020   Generalized anxiety disorder 06/06/2020   Hypothyroidism 06/06/2020   Low testosterone 06/06/2020   Moderate episode of recurrent major depressive disorder (HCC) 06/06/2020     Current Outpatient Medications on File Prior to Visit  Medication Sig Dispense Refill   amLODipine (NORVASC) 10 MG tablet Take 1 tablet (10 mg total) by mouth daily. 90 tablet 1   atorvastatin (LIPITOR) 20 MG tablet Take 1 tablet (20 mg total) by mouth daily. 90 tablet 1   buprenorphine-naloxone (SUBOXONE) 2-0.5 mg SUBL SL tablet Place 1 tablet under the tongue daily.     busPIRone (BUSPAR) 30 MG tablet Take 1 tablet (30 mg total) by mouth 2 (two) times daily. 60 tablet 3   diazepam (VALIUM) 2 MG tablet Take 1 tablet (2 mg total) by mouth daily as needed for anxiety. 30 tablet 0   DULoxetine (CYMBALTA) 60 MG capsule Take 1 capsule (60 mg total) by mouth 2 (two) times daily. 60 capsule 3   gabapentin (NEURONTIN) 600 MG tablet Take 600 mg by mouth 2 (two) times daily. For pain     influenza vac split quadrivalent PF (FLUARIX) 0.5 ML injection Inject into the muscle. 0.5 mL 0   levothyroxine (SYNTHROID) 50 MCG tablet Take 1 tablet by mouth once daily 30 tablet 0   lisinopril (ZESTRIL) 20 MG tablet  Take 1 tablet by mouth once daily 30 tablet 0   propranolol (INDERAL) 20 MG tablet Take 1 tablet (20 mg total) by mouth 3 (three) times daily. 90 tablet 4   SUMAtriptan (IMITREX) 100 MG tablet Take 100 mg by mouth daily as needed for migraine. May repeat in 2 hours if headache persists or recurs.     ziprasidone (GEODON) 60 MG capsule TAKE 1 CAPSULE BY MOUTH TWICE DAILY WITH A MEAL 60 capsule 0   No current facility-administered medications on file prior to visit.    Allergies  Allergen  Reactions   Morphine And Related     Social History   Socioeconomic History   Marital status: Single    Spouse name: Not on file   Number of children: Not on file   Years of education: Not on file   Highest education level: Not on file  Occupational History   Not on file  Tobacco Use   Smoking status: Former    Types: Cigarettes, E-cigarettes    Start date: 56    Quit date: 03/17/2018    Years since quitting: 3.7   Smokeless tobacco: Never   Tobacco comments:    Smoked 2-3 cigarettes per day when smoking.    Currently vaping 1 cartridge per 2 days.  Vaping Use   Vaping Use: Every day   Start date: 06/28/2017   Substances: Nicotine  Substance and Sexual Activity   Alcohol use: Never   Drug use: Yes    Types: Marijuana   Sexual activity: Not on file  Other Topics Concern   Not on file  Social History Narrative   Not on file   Social Determinants of Health   Financial Resource Strain: Not on file  Food Insecurity: Not on file  Transportation Needs: Not on file  Physical Activity: Not on file  Stress: Not on file  Social Connections: Not on file  Intimate Partner Violence: Not on file    Family History  Problem Relation Age of Onset   Heart failure Mother    Heart failure Father    Heart attack Father     Past Surgical History:  Procedure Laterality Date   BACK SURGERY     CERVICAL SPINE SURGERY      ROS: Review of Systems Negative except as stated above  PHYSICAL EXAM: BP (!) 87/56   Pulse 65   Temp 98.1 F (36.7 C) (Oral)   Resp 16   Wt 187 lb 3.2 oz (84.9 kg)   SpO2 95%   BMI 26.86 kg/m   Wt Readings from Last 3 Encounters:  12/03/21 187 lb 3.2 oz (84.9 kg)  08/07/21 166 lb 9.6 oz (75.6 kg)  08/03/21 169 lb 3.2 oz (76.7 kg)    Physical Exam   General appearance -patient appears sleepy.  He answers questions appropriately.  He is in no acute distress. Mouth - mucous membranes moist, pharynx normal without lesions Neck - supple, no  significant adenopathy Chest - clear to auscultation, no wheezes, rales or rhonchi, symmetric air entry Heart - normal rate, regular rhythm, normal S1, S2, no murmurs, rubs, clicks or gallops Extremities - peripheral pulses normal, no pedal edema, no clubbing or cyanosis Skin -no rash seen at this time on the lower legs around the ankles.     Latest Ref Rng & Units 08/03/2021    3:08 PM 07/31/2021    5:14 PM 12/07/2020    8:29 AM  CMP  Glucose 70 - 99 mg/dL  128  124  193   BUN 6 - 24 mg/dL 18  19  15    Creatinine 0.76 - 1.27 mg/dL 0.91  0.86  0.86   Sodium 134 - 144 mmol/L 141  138  135   Potassium 3.5 - 5.2 mmol/L 4.9  4.2  4.2   Chloride 96 - 106 mmol/L 101  102  100   CO2 20 - 29 mmol/L 28  27  27    Calcium 8.7 - 10.2 mg/dL 9.6  10.1  8.5   Total Protein 6.0 - 8.5 g/dL 7.5     Total Bilirubin 0.0 - 1.2 mg/dL 0.3     Alkaline Phos 44 - 121 IU/L 83     AST 0 - 40 IU/L 18     ALT 0 - 44 IU/L 20      Lipid Panel     Component Value Date/Time   CHOL 117 08/03/2021 1508   TRIG 117 08/03/2021 1508   HDL 50 08/03/2021 1508   CHOLHDL 2.3 08/03/2021 1508   LDLCALC 46 08/03/2021 1508    CBC    Component Value Date/Time   WBC 7.2 07/31/2021 1714   RBC 4.56 07/31/2021 1714   HGB 14.4 07/31/2021 1714   HCT 42.1 07/31/2021 1714   PLT 248 07/31/2021 1714   MCV 92.3 07/31/2021 1714   MCH 31.6 07/31/2021 1714   MCHC 34.2 07/31/2021 1714   RDW 11.3 (L) 07/31/2021 1714   LYMPHSABS 1.3 12/07/2020 0829   MONOABS 0.5 12/07/2020 0829   EOSABS 0.1 12/07/2020 0829   BASOSABS 0.0 12/07/2020 0829    ASSESSMENT AND PLAN: 1. Essential hypertension Patient hypotensive today but asymptomatic.  He is taken lisinopril, amlodipine and morning dose of propranolol already for today.  Advised that he hold off on taking evening dose of propranolol.  Advised to check blood pressure this evening and tomorrow morning.  If systolic blood pressure still less than 100, I request that he sends me a MyChart  message as we will need to adjust the dose of his medicines.  2. Hypothyroidism, unspecified type Controlled.  Continue levothyroxine 50 mcg daily.  3. Overweight (BMI 25.0-29.9) I am surprised with the weight gain.  Question whether Geodon may be playing a role.  Encouraged him to continue healthy eating habits and regular exercise.  4. Prediabetes See #3 above  5. Need for Tdap vaccination - Tdap vaccine greater than or equal to 7yo IM  6. GAD (generalized anxiety disorder) Followed by behavioral health.  7. Mixed hyperlipidemia Continue atorvastatin.  Patient to sign a release for [place] to get his colonoscopy report from [place].    Patient was given the opportunity to ask questions.  Patient verbalized understanding of the plan and was able to repeat key elements of the plan.   This documentation was completed using Radio producer.  Any transcriptional errors are unintentional.  Orders Placed This Encounter  Procedures   Tdap vaccine greater than or equal to 7yo IM     Requested Prescriptions    No prescriptions requested or ordered in this encounter    Return in about 4 months (around 04/04/2022) for Sign release for [place] to get colonoscopy report from [place]. Karle Plumber, MD, FACP

## 2021-12-03 NOTE — Patient Instructions (Addendum)
Please sign a release for [place] to get your colonoscopy report.  Hold off on taking your evening dose of propranolol.  Please recheck your blood pressure this afternoon and tomorrow morning before you take your medications.  If your top number is still running lower than 100, please send me a MyChart message as we will need to decrease the dose of one of your blood pressure medications or even stop one of them.  You appear sleepy today.  Please avoid driving when you feel sleepy or tired or after you have taken Valium.  Healthy Eating Following a healthy eating pattern may help you to achieve and maintain a healthy body weight, reduce the risk of chronic disease, and live a long and productive life. It is important to follow a healthy eating pattern at an appropriate calorie level for your body. Your nutritional needs should be met primarily through food by choosing a variety of nutrient-rich foods. What are tips for following this plan? Reading food labels Read labels and choose the following: Reduced or low sodium. Juices with 100% fruit juice. Foods with low saturated fats and high polyunsaturated and monounsaturated fats. Foods with whole grains, such as whole wheat, cracked wheat, brown rice, and wild rice. Whole grains that are fortified with folic acid. This is recommended for women who are pregnant or who want to become pregnant. Read labels and avoid the following: Foods with a lot of added sugars. These include foods that contain brown sugar, corn sweetener, corn syrup, dextrose, fructose, glucose, high-fructose corn syrup, honey, invert sugar, lactose, malt syrup, maltose, molasses, raw sugar, sucrose, trehalose, or turbinado sugar. Do not eat more than the following amounts of added sugar per day: 6 teaspoons (25 g) for women. 9 teaspoons (38 g) for men. Foods that contain processed or refined starches and grains. Refined grain products, such as white flour, degermed cornmeal, white  bread, and white rice. Shopping Choose nutrient-rich snacks, such as vegetables, whole fruits, and nuts. Avoid high-calorie and high-sugar snacks, such as potato chips, fruit snacks, and candy. Use oil-based dressings and spreads on foods instead of solid fats such as butter, stick margarine, or cream cheese. Limit pre-made sauces, mixes, and "instant" products such as flavored rice, instant noodles, and ready-made pasta. Try more plant-protein sources, such as tofu, tempeh, black beans, edamame, lentils, nuts, and seeds. Explore eating plans such as the Mediterranean diet or vegetarian diet. Cooking Use oil to saut or stir-fry foods instead of solid fats such as butter, stick margarine, or lard. Try baking, boiling, grilling, or broiling instead of frying. Remove the fatty part of meats before cooking. Steam vegetables in water or broth. Meal planning  At meals, imagine dividing your plate into fourths: One-half of your plate is fruits and vegetables. One-fourth of your plate is whole grains. One-fourth of your plate is protein, especially lean meats, poultry, eggs, tofu, beans, or nuts. Include low-fat dairy as part of your daily diet. Lifestyle Choose healthy options in all settings, including home, work, school, restaurants, or stores. Prepare your food safely: Wash your hands after handling raw meats. Keep food preparation surfaces clean by regularly washing with hot, soapy water. Keep raw meats separate from ready-to-eat foods, such as fruits and vegetables. Cook seafood, meat, poultry, and eggs to the recommended internal temperature. Store foods at safe temperatures. In general: Keep cold foods at 59F (4.4C) or below. Keep hot foods at 159F (60C) or above. Keep your freezer at [facility] (-17.8C) or below. Foods are no longer  safe to eat when they have been between the temperatures of 40-140F (4.4-60C) for more than 2 hours. What foods should I eat? Fruits Aim to eat 2  cup-equivalents of fresh, canned (in natural juice), or frozen fruits each day. Examples of 1 cup-equivalent of fruit include 1 small apple, 8 large strawberries, 1 cup canned fruit,  cup dried fruit, or 1 cup 100% juice. Vegetables Aim to eat 2-3 cup-equivalents of fresh and frozen vegetables each day, including different varieties and colors. Examples of 1 cup-equivalent of vegetables include 2 medium carrots, 2 cups raw, leafy greens, 1 cup chopped vegetable (raw or cooked), or 1 medium baked potato. Grains Aim to eat 6 ounce-equivalents of whole grains each day. Examples of 1 ounce-equivalent of grains include 1 slice of bread, 1 cup ready-to-eat cereal, 3 cups popcorn, or  cup cooked rice, pasta, or cereal. Meats and other proteins Aim to eat 5-6 ounce-equivalents of protein each day. Examples of 1 ounce-equivalent of protein include 1 egg, 1/2 cup nuts or seeds, or 1 tablespoon (16 g) peanut butter. A cut of meat or fish that is the size of a deck of cards is about 3-4 ounce-equivalents. Of the protein you eat each week, try to have at least 8 ounces come from seafood. This includes salmon, trout, herring, and anchovies. Dairy Aim to eat 3 cup-equivalents of fat-free or low-fat dairy each day. Examples of 1 cup-equivalent of dairy include 1 cup (240 mL) milk, 8 ounces (250 g) yogurt, 1 ounces (44 g) natural cheese, or 1 cup (240 mL) fortified soy milk. Fats and oils Aim for about 5 teaspoons (21 g) per day. Choose monounsaturated fats, such as canola and olive oils, avocados, peanut butter, and most nuts, or polyunsaturated fats, such as sunflower, corn, and soybean oils, walnuts, pine nuts, sesame seeds, sunflower seeds, and flaxseed. Beverages Aim for six 8-oz glasses of water per day. Limit coffee to three to five 8-oz cups per day. Limit caffeinated beverages that have added calories, such as soda and energy drinks. Limit alcohol intake to no more than 1 drink a day for nonpregnant women  and 2 drinks a day for men. One drink equals 12 oz of beer (355 mL), 5 oz of wine (148 mL), or 1 oz of hard liquor (44 mL). Seasoning and other foods Avoid adding excess amounts of salt to your foods. Try flavoring foods with herbs and spices instead of salt. Avoid adding sugar to foods. Try using oil-based dressings, sauces, and spreads instead of solid fats. This information is based on general [place] nutrition guidelines. For more information, visit [employer]. Exact amounts may vary based on your nutrition needs. Summary A healthy eating plan may help you to maintain a healthy weight, reduce the risk of chronic diseases, and stay active throughout your life. Plan your meals. Make sure you eat the right portions of a variety of nutrient-rich foods. Try baking, boiling, grilling, or broiling instead of frying. Choose healthy options in all settings, including home, work, school, restaurants, or stores. This information is not intended to replace advice given to you by your health care provider. Make sure you discuss any questions you have with your health care provider. Document Revised: 02/10/2021 Document Reviewed: 02/10/2021 Elsevier Patient Education  [place].

## 2021-12-03 NOTE — Progress Notes (Signed)
Patient is here for HTN follow-up. Patient said that he feels good and has no concerns today.

## 2021-12-10 ENCOUNTER — Other Ambulatory Visit: Payer: Self-pay | Admitting: Internal Medicine

## 2021-12-10 DIAGNOSIS — I10 Essential (primary) hypertension: Secondary | ICD-10-CM

## 2021-12-10 DIAGNOSIS — E039 Hypothyroidism, unspecified: Secondary | ICD-10-CM

## 2021-12-11 ENCOUNTER — Other Ambulatory Visit: Payer: Self-pay | Admitting: Pharmacist

## 2021-12-11 DIAGNOSIS — E785 Hyperlipidemia, unspecified: Secondary | ICD-10-CM

## 2021-12-11 DIAGNOSIS — I10 Essential (primary) hypertension: Secondary | ICD-10-CM

## 2021-12-11 DIAGNOSIS — F411 Generalized anxiety disorder: Secondary | ICD-10-CM

## 2021-12-11 MED ORDER — ATORVASTATIN CALCIUM 20 MG PO TABS: 20.0000 mg | ORAL_TABLET | Freq: Every day | ORAL | 0 refills | Status: DC

## 2021-12-11 MED ORDER — PROPRANOLOL HCL 20 MG PO TABS: 20.0000 mg | ORAL_TABLET | Freq: Three times a day (TID) | ORAL | 4 refills | Status: DC

## 2021-12-30 ENCOUNTER — Encounter (HOSPITAL_COMMUNITY): Payer: Self-pay | Admitting: Psychiatry

## 2021-12-30 ENCOUNTER — Telehealth (INDEPENDENT_AMBULATORY_CARE_PROVIDER_SITE_OTHER): Payer: Commercial Managed Care - HMO | Admitting: Psychiatry

## 2021-12-30 DIAGNOSIS — F3130 Bipolar disorder, current episode depressed, mild or moderate severity, unspecified: Secondary | ICD-10-CM

## 2021-12-30 DIAGNOSIS — F411 Generalized anxiety disorder: Secondary | ICD-10-CM | POA: Diagnosis not present

## 2021-12-30 DIAGNOSIS — G2571 Drug induced akathisia: Secondary | ICD-10-CM | POA: Diagnosis not present

## 2021-12-30 MED ORDER — BENZTROPINE MESYLATE 0.5 MG PO TABS: 1.0000 mg | ORAL_TABLET | Freq: Two times a day (BID) | ORAL | 3 refills | Status: DC

## 2021-12-30 MED ORDER — MIRTAZAPINE 15 MG PO TABS: 15.0000 mg | ORAL_TABLET | Freq: Every day | ORAL | 3 refills | Status: DC

## 2021-12-30 MED ORDER — ZIPRASIDONE HCL 60 MG PO CAPS: ORAL_CAPSULE | ORAL | 3 refills | Status: DC

## 2021-12-30 MED ORDER — BUSPIRONE HCL 30 MG PO TABS: 30.0000 mg | ORAL_TABLET | Freq: Two times a day (BID) | ORAL | 3 refills | Status: DC

## 2021-12-30 MED ORDER — DULOXETINE HCL 60 MG PO CPEP: 60.0000 mg | ORAL_CAPSULE | Freq: Two times a day (BID) | ORAL | 3 refills | Status: DC

## 2021-12-30 MED ORDER — DIAZEPAM 2 MG PO TABS: 2.0000 mg | ORAL_TABLET | Freq: Every day | ORAL | 0 refills | Status: DC | PRN

## 2021-12-30 NOTE — Progress Notes (Signed)
BH MD/PA/NP OP Progress Note Virtual Visit via Video Note  I connected with Samuel Ewing on 12/30/21 at  1:00 PM EDT by a video enabled telemedicine application and verified that I am speaking with the correct person using two identifiers.  Location: Patient: Home Provider: Clinic   I discussed the limitations of evaluation and management by telemedicine and the availability of in person appointments. The patient expressed understanding and agreed to proceed.  I provided 30 minutes of non-face-to-face time during this encounter.        12/30/2021 1:51 PM Samuel Ewing  MRN:  [id number]  Chief Complaint: "I constantly pace""  HPI: 51 year old male seen today for follow up psychiatric evaluation.  He has a psychiatric history of anxiety and depression. He is currently managed on BuSpar 30 mg twice daily, Cymbalta 60 mg twice daily, hydroxyzine 50 mg three times daily, propanolol 20 mg 3 times daily(receives from PCP), Gabapentin 600 mg twice daily (receives from pain management), Valium 2 mg once daily, Geodon 60 twice daily, and Suboxone sublingual tablets.  He notes that his medications are somewhat effective in managing his psychiatric conditions.   Today patient was well-groomed, pleasant, cooperative, and engaged in conversation.  He informed Clinical research associate that he constantly paces.  He informed Clinical research associate that he is unable to sit down despite trying.  Provider conducted an aims assessment and patient scored a 2.  Provider informed patient that Geodon could be a potential cause of his akathisia.  Patient notes that his mood is stable and notes that he has minimal depression.  He does note that his anxiety is bothersome.  Today provider conducted a GAD-7 and patient scored a 19.  He requested that Valium be increased.  Provider informed patient that Valium would not be increased however noted that mirtazapine could be trialed.  He endorsed understanding and agreed.  Provider also conducted  PHQ-9 and patient scored a 3.  He endorses adequate sleep and appetite.  Today he denies SI/HI/VAH, mania, or paranoia.   Today patient agreeable to starting mirtazapine 15 mg nightly to help manage anxiety.  He also agreeable to starting Cogentin 0.5 mg daily to help manage akathisia.  Potential side effects of medication and risks vs benefits of treatment vs non-treatment were explained and discussed. All questions were answered.He will continue other medications as prescribed.  No other concerns at this time.     Visit Diagnosis:    ICD-10-CM   1. Akathisia  G25.71 benztropine (COGENTIN) 0.5 MG tablet    2. Generalized anxiety disorder  F41.1 busPIRone (BUSPAR) 30 MG tablet    DULoxetine (CYMBALTA) 60 MG capsule    ziprasidone (GEODON) 60 MG capsule    mirtazapine (REMERON) 15 MG tablet    3. Bipolar I disorder, most recent episode depressed (HCC)  F31.30 ziprasidone (GEODON) 60 MG capsule    mirtazapine (REMERON) 15 MG tablet      Past Psychiatric History: Anxiety and depression  Past Medical History:  Past Medical History:  Diagnosis Date   Anxiety    Depression    Hypertension    Migraine    Opiate dependence, continuous (HCC)    Opiate withdrawal (HCC)    Pneumonia 11/14/2020    Past Surgical History:  Procedure Laterality Date   BACK SURGERY     CERVICAL SPINE SURGERY      Family Psychiatric History:  Paternal grandmother depression  Family History:  Family History  Problem Relation Age of Onset   Heart failure Mother  Heart failure Father    Heart attack Father     Social History:  Social History   Socioeconomic History   Marital status: Single    Spouse name: Not on file   Number of children: Not on file   Years of education: Not on file   Highest education level: Not on file  Occupational History   Not on file  Tobacco Use   Smoking status: Former    Types: Cigarettes, E-cigarettes    Start date: 43    Quit date: 03/17/2018    Years  since quitting: 3.7   Smokeless tobacco: Never   Tobacco comments:    Smoked 2-3 cigarettes per day when smoking.    Currently vaping 1 cartridge per 2 days.  Vaping Use   Vaping Use: Every day   Start date: 06/28/2017   Substances: Nicotine  Substance and Sexual Activity   Alcohol use: Never   Drug use: Yes    Types: Marijuana   Sexual activity: Not on file  Other Topics Concern   Not on file  Social History Narrative   Not on file   Social Determinants of Health   Financial Resource Strain: Not on file  Food Insecurity: Not on file  Transportation Needs: Not on file  Physical Activity: Not on file  Stress: Not on file  Social Connections: Not on file    Allergies:  Allergies  Allergen Reactions   Morphine And Related     Metabolic Disorder Labs: Lab Results  Component Value Date   HGBA1C 5.7 (H) 08/03/2021   MPG 136.98 10/19/2020   No results found for: "PROLACTIN" Lab Results  Component Value Date   CHOL 117 08/03/2021   TRIG 117 08/03/2021   HDL 50 08/03/2021   CHOLHDL 2.3 08/03/2021   LDLCALC 46 08/03/2021   LDLCALC 59 06/18/2020   Lab Results  Component Value Date   TSH 1.340 08/03/2021   TSH 0.431 10/20/2020    Therapeutic Level Labs: No results found for: "LITHIUM" No results found for: "VALPROATE" No results found for: "CBMZ"  Current Medications: Current Outpatient Medications  Medication Sig Dispense Refill   benztropine (COGENTIN) 0.5 MG tablet Take 2 tablets (1 mg total) by mouth 2 (two) times daily. 60 tablet 3   mirtazapine (REMERON) 15 MG tablet Take 1 tablet (15 mg total) by mouth at bedtime. 30 tablet 3   amLODipine (NORVASC) 10 MG tablet Take 1 tablet by mouth once daily 90 tablet 0   atorvastatin (LIPITOR) 20 MG tablet Take 1 tablet (20 mg total) by mouth daily. 90 tablet 0   buprenorphine-naloxone (SUBOXONE) 2-0.5 mg SUBL SL tablet Place 1 tablet under the tongue daily.     busPIRone (BUSPAR) 30 MG tablet Take 1 tablet (30 mg  total) by mouth 2 (two) times daily. 60 tablet 3   diazepam (VALIUM) 2 MG tablet Take 1 tablet (2 mg total) by mouth daily as needed for anxiety. 30 tablet 0   DULoxetine (CYMBALTA) 60 MG capsule Take 1 capsule (60 mg total) by mouth 2 (two) times daily. 60 capsule 3   influenza vac split quadrivalent PF (FLUARIX) 0.5 ML injection Inject into the muscle. 0.5 mL 0   levothyroxine (SYNTHROID) 50 MCG tablet Take 1 tablet by mouth once daily 90 tablet 0   lisinopril (ZESTRIL) 20 MG tablet Take 1 tablet by mouth once daily 90 tablet 0   propranolol (INDERAL) 20 MG tablet Take 1 tablet (20 mg total) by mouth 3 (three)  times daily. 90 tablet 4   SUMAtriptan (IMITREX) 100 MG tablet Take 100 mg by mouth daily as needed for migraine. May repeat in 2 hours if headache persists or recurs.     ziprasidone (GEODON) 60 MG capsule TAKE 1 CAPSULE BY MOUTH TWICE DAILY WITH A MEAL 60 capsule 3   No current facility-administered medications for this visit.     Musculoskeletal: Strength & Muscle Tone: within normal limits and telehealth visit Gait & Station: normal, telehealth visit Patient leans: N/A  Psychiatric Specialty Exam: Review of Systems  There were no vitals taken for this visit.There is no height or weight on file to calculate BMI.  General Appearance: Well Groomed  Eye Contact:  Good  Speech:  Clear and Coherent and Normal Rate  Volume:  Normal  Mood:  Anxious and Depressed  Affect:  Appropriate and Congruent  Thought Process:  Coherent, Goal Directed and Linear  Orientation:  Full (Time, Place, and Person)  Thought Content: WDL and Logical   Suicidal Thoughts:  No  Homicidal Thoughts:  No  Memory:  Immediate;   Good Recent;   Good Remote;   Good  Judgement:  Good  Insight:  Good  Psychomotor Activity:  EPS and Restlessness  Concentration:  Concentration: Good and Attention Span: Good  Recall:  Good  Fund of Knowledge: Good  Language: Good  Akathisia:  Yes  Handed:  Right  AIMS  (if indicated): Done, 2  Assets:  Communication Skills Desire for Improvement Financial Resources/Insurance Housing Social Support  ADL's:  Intact  Cognition: WNL  Sleep:  Good   Screenings: AIMS    Flowsheet Row Video Visit [facility] 12/30/2021 in [facility]  AIMS Total Score 2      GAD-7    Flowsheet Row Video Visit from 12/30/2021 in [facility] Counselor from 11/06/2021 in [facility] Counselor from 10/08/2021 in [facility] Video Visit from 06/16/2021 in [facility] Video Visit from 03/11/2021 in [facility]  Total GAD-7 Score 19 10 17 16 18       PHQ2-9    Flowsheet Row Video Visit from 12/30/2021 in [facility] Office Visit from 12/03/2021 in [facility] Counselor from 11/06/2021 in [facility] Counselor from 10/08/2021 in [facility] Counselor from 09/23/2021 in [facility] PART[facility]PITALIZATION PROGRAM  PHQ-2 Total Score 1 0 2 4 5   PHQ-9 Total Score 3 0 9 9 21       Flowsheet Row Video Visit from 12/30/2021 in [facility] Counselor from 11/06/2021 in [facility] Counselor from 09/23/2021 in BEHAVIORAL HEALTH PARTIAL HOSPITALIZATION PROGRAM  C-SSRS RISK CATEGORY Error: Question 6 not populated Moderate Risk No Risk        Assessment and Plan: Patient endorses anxiety and akathisia. Today patient agreeable to starting mirtazapine 15 mg nightly to help manage anxiety.  He also agreeable to starting Cogentin 0.5 mg daily to help manage akathisia.  He will continue other medications as prescribed.   1. Generalized anxiety disorder  Continue- busPIRone (BUSPAR) 30 MG tablet; Take 1 tablet (30 mg total) by mouth 2 (two) times daily.  Dispense:  60 tablet; Refill: 3 Continue- DULoxetine (CYMBALTA) 60 MG capsule; Take 1 capsule (60 mg total) by mouth 2 (two) times daily.  Dispense: 60 capsule; Refill: 3 Continue- ziprasidone (GEODON) 60 MG  capsule; TAKE 1 CAPSULE BY MOUTH TWICE DAILY WITH A MEAL  Dispense: 60 capsule; Refill: 3 Start- mirtazapine (REMERON) 15 MG tablet; Take 1 tablet (15 mg total) by mouth at bedtime.  Dispense: 30 tablet; Refill: 3  2. Bipolar I disorder, most recent episode depressed (HCC)  Continue- ziprasidone (GEODON) 60 MG capsule; TAKE 1 CAPSULE BY MOUTH TWICE DAILY WITH A MEAL  Dispense: 60 capsule; Refill: 3 Start- mirtazapine (REMERON) 15 MG tablet; Take 1 tablet (15 mg total) by mouth at bedtime.  Dispense: 30 tablet; Refill: 3  3. Akathisia  Continue- benztropine (COGENTIN) 0.5 MG tablet; Take 2 tablets (1 mg total) by mouth 2 (two) times daily.  Dispense: 60 tablet; Refill: 3    Follow up in 3 months Follow-up with therapy  Shanna Cisco, NP 12/30/2021, 1:51 PM

## 2022-02-22 ENCOUNTER — Other Ambulatory Visit: Payer: Self-pay | Admitting: Internal Medicine

## 2022-02-22 DIAGNOSIS — E039 Hypothyroidism, unspecified: Secondary | ICD-10-CM

## 2022-02-22 DIAGNOSIS — I10 Essential (primary) hypertension: Secondary | ICD-10-CM

## 2022-02-22 DIAGNOSIS — E785 Hyperlipidemia, unspecified: Secondary | ICD-10-CM

## 2022-02-22 NOTE — Telephone Encounter (Signed)
Medication Refill - Medication: amLODipine (NORVASC) 10 MG tablet,levothyroxine (SYNTHROID) 50 MCG tablet,lisinopril (ZESTRIL) 20 MG tablet ,atorvastatin (LIPITOR) 20 MG tablet  Has the patient contacted their pharmacy? No. (Agent: If no, request that the patient contact the pharmacy for the refill. If patient does not wish to contact the pharmacy document the reason why and proceed with request.)   Preferred Pharmacy (with phone number or street name):  [facility] [address], [place] - [address]  [address] [place] [address]  Phone: [phone] Fax: [phone]  Hours: Not open 24 hours   Has the patient been seen for an appointment in the last year OR does the patient have an upcoming appointment? Yes.    Agent: Please be advised that RX refills may take up to 3 business days. We ask that you follow-up with your pharmacy.

## 2022-02-23 MED ORDER — LEVOTHYROXINE SODIUM 50 MCG PO TABS: 50.0000 ug | ORAL_TABLET | Freq: Every day | ORAL | 2 refills | Status: DC

## 2022-02-23 MED ORDER — AMLODIPINE BESYLATE 10 MG PO TABS: 10.0000 mg | ORAL_TABLET | Freq: Every day | ORAL | 0 refills | Status: DC

## 2022-02-23 MED ORDER — ATORVASTATIN CALCIUM 20 MG PO TABS: 20.0000 mg | ORAL_TABLET | Freq: Every day | ORAL | 0 refills | Status: DC

## 2022-02-23 MED ORDER — LISINOPRIL 20 MG PO TABS: 20.0000 mg | ORAL_TABLET | Freq: Every day | ORAL | 0 refills | Status: DC

## 2022-02-23 NOTE — Telephone Encounter (Signed)
Requested medication (s) are due for refill today:   Yes for all 4  Requested medication (s) are on the active medication list:   Yes for all 4  Future visit scheduled:   Yes   Last ordered: All ordered 12/10/2021 #90, 0 refills  Returned because labs are due per protocol.     Requested Prescriptions  Pending Prescriptions Disp Refills   amLODipine (NORVASC) 10 MG tablet 90 tablet 0    Sig: Take 1 tablet (10 mg total) by mouth daily.     Cardiovascular: Calcium Channel Blockers 2 Failed - 02/22/2022  2:00 PM      Failed - Last BP in normal range    BP Readings from Last 1 Encounters:  12/03/21 (!) 87/56         Passed - Last Heart Rate in normal range    Pulse Readings from Last 1 Encounters:  12/03/21 65         Passed - Valid encounter within last 6 months    Recent Outpatient Visits           2 months ago Essential hypertension   [facility] Marcine Matar, MD   4 months ago Need for shingles vaccine   [facility] Lois Huxley, Cornelius Moras, RPH-CPP   6 months ago Essential hypertension   [facility] Lois Huxley, Cornelius Moras, RPH-CPP   6 months ago Essential hypertension   [facility] Eleonore Chiquito, FNP   6 months ago Essential hypertension   [facility] Marcine Matar, MD       Future Appointments             In 1 month Marcine Matar, MD [facility]             levothyroxine (SYNTHROID) 50 MCG tablet 90 tablet 0    Sig: Take 1 tablet (50 mcg total) by mouth daily.     Endocrinology:  Hypothyroid Agents Passed - 02/22/2022  2:00 PM      Passed - TSH in normal range and within 360 days    TSH  Date Value Ref Range Status  08/03/2021 1.340 0.450 - 4.500 uIU/mL Final         Passed - Valid encounter within last 12 months    Recent Outpatient Visits           2  months ago Essential hypertension   [facility] Marcine Matar, MD   4 months ago Need for shingles vaccine   [facility] Lois Huxley, Cornelius Moras, RPH-CPP   6 months ago Essential hypertension   [facility] Lois Huxley, Cornelius Moras, RPH-CPP   6 months ago Essential hypertension   [facility] Eleonore Chiquito, FNP   6 months ago Essential hypertension   [facility] Marcine Matar, MD       Future Appointments             In 1 month Marcine Matar, MD [facility]             lisinopril (ZESTRIL) 20 MG tablet 90 tablet 0    Sig: Take 1 tablet (20 mg total) by mouth daily.     Cardiovascular:  ACE  Inhibitors Failed - 02/22/2022  2:00 PM      Failed - Cr in normal range and within 180 days    Creatinine, Ser  Date Value Ref Range Status  08/03/2021 0.91 0.76 - 1.27 mg/dL Final         Failed - K in normal range and within 180 days    Potassium  Date Value Ref Range Status  08/03/2021 4.9 3.5 - 5.2 mmol/L Final         Failed - Last BP in normal range    BP Readings from Last 1 Encounters:  12/03/21 (!) 87/56         Passed - Patient is not pregnant      Passed - Valid encounter within last 6 months    Recent Outpatient Visits           2 months ago Essential hypertension   [facility] Marcine Matar, MD   4 months ago Need for shingles vaccine   [facility] Lois Huxley, Cornelius Moras, RPH-CPP   6 months ago Essential hypertension   [facility] Lois Huxley, Cornelius Moras, RPH-CPP   6 months ago Essential hypertension   [facility] Eleonore Chiquito, FNP   6 months ago Essential hypertension   [facility] Marcine Matar, MD        Future Appointments             In 1 month Marcine Matar, MD [facility]             atorvastatin (LIPITOR) 20 MG tablet 90 tablet 0    Sig: Take 1 tablet (20 mg total) by mouth daily.     Cardiovascular:  Antilipid - Statins Failed - 02/22/2022  2:00 PM      Failed - Lipid Panel in normal range within the last 12 months    Cholesterol, Total  Date Value Ref Range Status  08/03/2021 117 100 - 199 mg/dL Final   LDL Chol Calc (NIH)  Date Value Ref Range Status  08/03/2021 46 0 - 99 mg/dL Final   HDL  Date Value Ref Range Status  08/03/2021 50 >39 mg/dL Final   Triglycerides  Date Value Ref Range Status  08/03/2021 117 0 - 149 mg/dL Final         Passed - Patient is not pregnant      Passed - Valid encounter within last 12 months    Recent Outpatient Visits           2 months ago Essential hypertension   [facility] Marcine Matar, MD   4 months ago Need for shingles vaccine   [facility] Lois Huxley, Cornelius Moras, RPH-CPP   6 months ago Essential hypertension   [facility] Lois Huxley, Cornelius Moras, RPH-CPP   6 months ago Essential hypertension   [facility] [place], Jomarie Longs, FNP   6 months ago Essential hypertension   [facility] Marcine Matar, MD       Future Appointments             In 1 month Laural Benes, Binnie Rail, MD [facility]

## 2022-04-01 ENCOUNTER — Telehealth (INDEPENDENT_AMBULATORY_CARE_PROVIDER_SITE_OTHER): Payer: Commercial Managed Care - HMO | Admitting: Psychiatry

## 2022-04-01 ENCOUNTER — Encounter (HOSPITAL_COMMUNITY): Payer: Self-pay | Admitting: Psychiatry

## 2022-04-01 DIAGNOSIS — F411 Generalized anxiety disorder: Secondary | ICD-10-CM

## 2022-04-01 DIAGNOSIS — F3130 Bipolar disorder, current episode depressed, mild or moderate severity, unspecified: Secondary | ICD-10-CM

## 2022-04-01 MED ORDER — DIAZEPAM 2 MG PO TABS: 2.0000 mg | ORAL_TABLET | Freq: Every day | ORAL | 0 refills | Status: AC | PRN

## 2022-04-01 MED ORDER — BUSPIRONE HCL 30 MG PO TABS: 30.0000 mg | ORAL_TABLET | Freq: Two times a day (BID) | ORAL | 3 refills | Status: DC

## 2022-04-01 MED ORDER — ZIPRASIDONE HCL 60 MG PO CAPS: ORAL_CAPSULE | ORAL | 3 refills | Status: DC

## 2022-04-01 MED ORDER — MIRTAZAPINE 15 MG PO TABS: 15.0000 mg | ORAL_TABLET | Freq: Every day | ORAL | 3 refills | Status: DC

## 2022-04-01 MED ORDER — DULOXETINE HCL 60 MG PO CPEP: 60.0000 mg | ORAL_CAPSULE | Freq: Two times a day (BID) | ORAL | 3 refills | Status: DC

## 2022-04-01 NOTE — Progress Notes (Signed)
[place] MD/PA/NP OP Progress Note Virtual Visit via Telephone Note  I connected with Samuel Ewing on 04/01/22 at  2:30 PM EDT by telephone and verified that I am speaking with the correct person using two identifiers.  Location: Patient: home Provider: Clinic   I discussed the limitations, risks, security and privacy concerns of performing an evaluation and management service by telephone and the availability of in person appointments. I also discussed with the patient that there may be a patient responsible charge related to this service. The patient expressed understanding and agreed to proceed.   I provided 30 minutes of non-face-to-face time during this encounter.        04/01/2022 3:04 PM Samuel Ewing  MRN:  [id number]  Chief Complaint: "I love the mirtazapine but stopped at the Cogentin"  HPI: 51 year old male seen today for follow up psychiatric evaluation.  He has a psychiatric history of anxiety and depression. He is currently managed on BuSpar 30 mg twice daily, Cymbalta 60 mg twice daily, hydroxyzine 50 mg three times daily, mirtazapine 15 mg nightly, propanolol 20 mg 3 times daily(receives from PCP), Gabapentin 600 mg twice daily (receives from pain management), Valium 2 mg once daily, Geodon 60 twice daily, and Suboxone sublingual tablets.  He notes that his medications are effective in managing his psychiatric conditions.   Today patient was unable to login virtually so his assessment was done on phone.  During exam he was pleasant, cooperative, and engaged in conversation.  He informed Probation officer that he does mirtazapine but notes that he stopped Cogentin because it was causing him to be depressed.  He notes that he still has restlessness/akathisia but reports that he is able to cope with it.  Overall patient notes that his mood is stable and reports that he has minimal anxiety and depression.  Today provider conducted a GAD-7 and patient scored an 8, at his last visit he  scored a 19.  Provider also conducted PHQ-9 patient scored an 8, at his last visit he scored a 3.  He endorses adequate sleep and appetite.  Today he denies SI/HI/VH, mania, paranoia.  At this time Cogentin discontinued.  Patient agreeable to continue all other medication as prescribed.  No other concerns at this time.     Visit Diagnosis:    ICD-10-CM   1. Generalized anxiety disorder  F41.1 busPIRone (BUSPAR) 30 MG tablet    DULoxetine (CYMBALTA) 60 MG capsule    mirtazapine (REMERON) 15 MG tablet    ziprasidone (GEODON) 60 MG capsule    2. Bipolar I disorder, most recent episode depressed (HCC)  F31.30 mirtazapine (REMERON) 15 MG tablet    ziprasidone (GEODON) 60 MG capsule      Past Psychiatric History: Anxiety and depression  Past Medical History:  Past Medical History:  Diagnosis Date   Anxiety    Depression    Hypertension    Migraine    Opiate dependence, continuous (HCC)    Opiate withdrawal ([place])    Pneumonia 11/14/2020    Past Surgical History:  Procedure Laterality Date   BACK SURGERY     CERVICAL SPINE SURGERY      Family Psychiatric History:  Paternal grandmother depression  Family History:  Family History  Problem Relation Age of Onset   Heart failure Mother    Heart failure Father    Heart attack Father     Social History:  Social History   Socioeconomic History   Marital status: Single    Spouse  name: Not on file   Number of children: Not on file   Years of education: Not on file   Highest education level: Not on file  Occupational History   Not on file  Tobacco Use   Smoking status: Former    Types: Cigarettes, E-cigarettes    Start date: 49    Quit date: 03/17/2018    Years since quitting: 4.0   Smokeless tobacco: Never   Tobacco comments:    Smoked 2-3 cigarettes per day when smoking.    Currently vaping 1 cartridge per 2 days.  Vaping Use   Vaping Use: Every day   Start date: 06/28/2017   Substances: Nicotine  Substance and  Sexual Activity   Alcohol use: Never   Drug use: Yes    Types: Marijuana   Sexual activity: Not on file  Other Topics Concern   Not on file  Social History Narrative   Not on file   Social Determinants of Health   Financial Resource Strain: Not on file  Food Insecurity: Not on file  Transportation Needs: Not on file  Physical Activity: Not on file  Stress: Not on file  Social Connections: Not on file    Allergies:  Allergies  Allergen Reactions   Morphine And Related     Metabolic Disorder Labs: Lab Results  Component Value Date   HGBA1C 5.7 (H) 08/03/2021   MPG 136.98 10/19/2020   No results found for: "PROLACTIN" Lab Results  Component Value Date   CHOL 117 08/03/2021   TRIG 117 08/03/2021   HDL 50 08/03/2021   CHOLHDL 2.3 08/03/2021   LDLCALC 46 08/03/2021   LDLCALC 59 06/18/2020   Lab Results  Component Value Date   TSH 1.340 08/03/2021   TSH 0.431 10/20/2020    Therapeutic Level Labs: No results found for: "LITHIUM" No results found for: "VALPROATE" No results found for: "CBMZ"  Current Medications: Current Outpatient Medications  Medication Sig Dispense Refill   amLODipine (NORVASC) 10 MG tablet Take 1 tablet (10 mg total) by mouth daily. 90 tablet 0   atorvastatin (LIPITOR) 20 MG tablet Take 1 tablet (20 mg total) by mouth daily. 90 tablet 0   buprenorphine-naloxone (SUBOXONE) 2-0.5 mg SUBL SL tablet Place 1 tablet under the tongue daily.     busPIRone (BUSPAR) 30 MG tablet Take 1 tablet (30 mg total) by mouth 2 (two) times daily. 60 tablet 3   diazepam (VALIUM) 2 MG tablet Take 1 tablet (2 mg total) by mouth daily as needed for anxiety. 30 tablet 0   DULoxetine (CYMBALTA) 60 MG capsule Take 1 capsule (60 mg total) by mouth 2 (two) times daily. 60 capsule 3   influenza vac split quadrivalent PF (FLUARIX) 0.5 ML injection Inject into the muscle. 0.5 mL 0   levothyroxine (SYNTHROID) 50 MCG tablet Take 1 tablet (50 mcg total) by mouth daily. 30  tablet 2   lisinopril (ZESTRIL) 20 MG tablet Take 1 tablet (20 mg total) by mouth daily. 90 tablet 0   mirtazapine (REMERON) 15 MG tablet Take 1 tablet (15 mg total) by mouth at bedtime. 30 tablet 3   propranolol (INDERAL) 20 MG tablet Take 1 tablet (20 mg total) by mouth 3 (three) times daily. 90 tablet 4   SUMAtriptan (IMITREX) 100 MG tablet Take 100 mg by mouth daily as needed for migraine. May repeat in 2 hours if headache persists or recurs.     ziprasidone (GEODON) 60 MG capsule TAKE 1 CAPSULE BY MOUTH TWICE  DAILY WITH A MEAL 60 capsule 3   No current facility-administered medications for this visit.     Musculoskeletal: Strength & Muscle Tone: within normal limits and telehealth visit Gait & Station: normal, telehealth visit Patient leans: N/A  Psychiatric Specialty Exam: Review of Systems  There were no vitals taken for this visit.There is no height or weight on file to calculate BMI.  General Appearance: Well Groomed  Eye Contact:  Good  Speech:  Clear and Coherent and Normal Rate  Volume:  Normal  Mood:  Anxious and Depressed  Affect:  Appropriate and Congruent  Thought Process:  Coherent, Goal Directed and Linear  Orientation:  Full (Time, Place, and Person)  Thought Content: WDL and Logical   Suicidal Thoughts:  No  Homicidal Thoughts:  No  Memory:  Immediate;   Good Recent;   Good Remote;   Good  Judgement:  Good  Insight:  Good  Psychomotor Activity:  EPS and Restlessness  Concentration:  Concentration: Good and Attention Span: Good  Recall:  Good  Fund of Knowledge: Good  Language: Good  Akathisia:  Yes  Handed:  Right  AIMS (if indicated): Done, 2  Assets:  Communication Skills Desire for Improvement Financial Resources/Insurance Housing Social Support  ADL's:  Intact  Cognition: WNL  Sleep:  Good   Screenings: AIMS    Flowsheet Row Video Visit from 12/30/2021 in [facility] Total Score 2      GAD-7     Flowsheet Row Video Visit from 12/30/2021 in [facility] Counselor from 11/06/2021 in [facility] Counselor from 10/08/2021 in [facility] Video Visit from 06/16/2021 in [facility] Video Visit from 03/11/2021 in [facility]  Total GAD-7 Score 19 10 17 16 18       PHQ2-9    Flowsheet Row Video Visit from 12/30/2021 in [facility] Office Visit from 12/03/2021 in [place] Counselor from 11/06/2021 in [facility] Counselor from 10/08/2021 in [facility] Counselor from 09/23/2021 in [place]  PHQ-2 Total Score 1 0 2 4 5   PHQ-9 Total Score 3 0 9 9 21       Flowsheet Row Video Visit from 12/30/2021 in [facility] Counselor from 11/06/2021 in [facility] Counselor from 09/23/2021 in [place] Error: Question 6 not populated Moderate Risk No Risk        Assessment and Plan: Patient reports that he is doing well on his current medication regimen.  At this time he does not wish to restart Cogentin.  No other medication changes made today.  Patient agreeable to continue medications as prescribed. 1. Generalized anxiety disorder  Continue- busPIRone (BUSPAR) 30 MG tablet; Take 1 tablet (30 mg total) by mouth 2 (two) times daily.  Dispense: 60 tablet; Refill: 3 Continue- DULoxetine (CYMBALTA) 60 MG capsule; Take 1 capsule (60 mg total) by mouth 2 (two) times daily.  Dispense: 60 capsule; Refill: 3 Continue- mirtazapine (REMERON) 15 MG tablet; Take 1 tablet (15 mg total) by mouth at bedtime.  Dispense: 30 tablet; Refill: 3 Continue- ziprasidone (GEODON) 60 MG capsule; TAKE 1 CAPSULE BY MOUTH TWICE  DAILY WITH A MEAL  Dispense: 60 capsule; Refill: 3 Continue- diazepam (VALIUM) 2 MG tablet; Take 1 tablet (2 mg total)  by mouth daily as needed for anxiety.  Dispense: 30 tablet; Refill: 0  2. Bipolar I disorder, most recent episode depressed (HCC)  Continue- mirtazapine (REMERON) 15 MG tablet; Take 1 tablet (15 mg total) by mouth at bedtime.  Dispense: 30 tablet; Refill: 3 Continue- ziprasidone (GEODON) 60 MG capsule; TAKE 1 CAPSULE BY MOUTH TWICE DAILY WITH A MEAL  Dispense: 60 capsule; Refill: 3     Follow up in 3 months Follow-up with therapy  Salley Slaughter, NP 04/01/2022, 3:04 PM

## 2022-04-05 ENCOUNTER — Ambulatory Visit: Payer: Commercial Managed Care - HMO | Admitting: Internal Medicine

## 2022-04-14 ENCOUNTER — Telehealth (HOSPITAL_COMMUNITY): Payer: Self-pay | Admitting: *Deleted

## 2022-04-14 NOTE — Telephone Encounter (Signed)
Rx Refill Request Buspirone 30 mg  Duloxetine 60 ng mirtazapine (REMERON) 15 MG tablet

## 2022-04-15 ENCOUNTER — Ambulatory Visit (HOSPITAL_COMMUNITY): Payer: 59 | Admitting: Clinical

## 2022-04-26 ENCOUNTER — Other Ambulatory Visit: Payer: Self-pay | Admitting: Internal Medicine

## 2022-04-26 DIAGNOSIS — I10 Essential (primary) hypertension: Secondary | ICD-10-CM

## 2022-04-26 DIAGNOSIS — F411 Generalized anxiety disorder: Secondary | ICD-10-CM

## 2022-04-26 DIAGNOSIS — E039 Hypothyroidism, unspecified: Secondary | ICD-10-CM

## 2022-04-26 DIAGNOSIS — E785 Hyperlipidemia, unspecified: Secondary | ICD-10-CM

## 2022-04-26 NOTE — Telephone Encounter (Signed)
Medication Refill - Medication: propranolol (INDERAL) 20 MG tablet [[id number]]   lisinopril (ZESTRIL) 20 MG tablet [[id number]]  levothyroxine (SYNTHROID) 50 MCG tablet [[id number]]  atorvastatin (LIPITOR) 20 MG tablet [[id number]]  amLODipine (NORVASC) 10 MG tablet [[id number]]    Has the patient contacted their pharmacy? Yes.   (Agent: If no, request that the patient contact the pharmacy for the refill. If patient does not wish to contact the pharmacy document the reason why and proceed with request.) (Agent: If yes, when and what did the pharmacy advise?)  Preferred Pharmacy (with phone number or street name): [facility] [address] [address] [phone] Has the patient been seen for an appointment in the last year OR does the patient have an upcoming appointment? Yes.    Agent: Please be advised that RX refills may take up to 3 business days. We ask that you follow-up with your pharmacy.

## 2022-04-27 MED ORDER — LISINOPRIL 20 MG PO TABS: 20.0000 mg | ORAL_TABLET | Freq: Every day | ORAL | 0 refills | Status: AC

## 2022-04-27 MED ORDER — AMLODIPINE BESYLATE 10 MG PO TABS: 10.0000 mg | ORAL_TABLET | Freq: Every day | ORAL | 0 refills | Status: DC

## 2022-04-27 MED ORDER — LEVOTHYROXINE SODIUM 50 MCG PO TABS: 50.0000 ug | ORAL_TABLET | Freq: Every day | ORAL | 1 refills | Status: AC

## 2022-04-27 MED ORDER — PROPRANOLOL HCL 20 MG PO TABS: 20.0000 mg | ORAL_TABLET | Freq: Three times a day (TID) | ORAL | 2 refills | Status: AC

## 2022-04-27 MED ORDER — ATORVASTATIN CALCIUM 20 MG PO TABS: 20.0000 mg | ORAL_TABLET | Freq: Every day | ORAL | 2 refills | Status: AC

## 2022-04-27 NOTE — Telephone Encounter (Signed)
Requested Prescriptions  Pending Prescriptions Disp Refills  . propranolol (INDERAL) 20 MG tablet 90 tablet 2    Sig: Take 1 tablet (20 mg total) by mouth 3 (three) times daily.     Cardiovascular:  Beta Blockers Failed - 04/26/2022 11:02 AM      Failed - Last BP in normal range    BP Readings from Last 1 Encounters:  12/03/21 (!) 87/56         Passed - Last Heart Rate in normal range    Pulse Readings from Last 1 Encounters:  12/03/21 65         Passed - Valid encounter within last 6 months    Recent Outpatient Visits          4 months ago Essential hypertension   [place], MD   6 months ago Need for shingles vaccine   [place], Jarome Matin, RPH-CPP   8 months ago Essential hypertension   [place], Jarome Matin, RPH-CPP   8 months ago Essential hypertension   [place], Broadus John, FNP   8 months ago Essential hypertension   [place], Neoma Laming B, MD             . lisinopril (ZESTRIL) 20 MG tablet 90 tablet 0    Sig: Take 1 tablet (20 mg total) by mouth daily.     Cardiovascular:  ACE Inhibitors Failed - 04/26/2022 11:02 AM      Failed - Cr in normal range and within 180 days    Creatinine, Ser  Date Value Ref Range Status  08/03/2021 0.91 0.76 - 1.27 mg/dL Final         Failed - K in normal range and within 180 days    Potassium  Date Value Ref Range Status  08/03/2021 4.9 3.5 - 5.2 mmol/L Final         Failed - Last BP in normal range    BP Readings from Last 1 Encounters:  12/03/21 (!) 87/56         Passed - Patient is not pregnant      Passed - Valid encounter within last 6 months    Recent Outpatient Visits          4 months ago Essential hypertension   [place], MD   6 months ago Need  for shingles vaccine   [place], Jarome Matin, RPH-CPP   8 months ago Essential hypertension   [place], Jarome Matin, RPH-CPP   8 months ago Essential hypertension   [place], Broadus John, FNP   8 months ago Essential hypertension   [place] Karle Plumber B, MD             . levothyroxine (SYNTHROID) 50 MCG tablet 90 tablet 1    Sig: Take 1 tablet (50 mcg total) by mouth daily.     Endocrinology:  Hypothyroid Agents Passed - 04/26/2022 11:02 AM      Passed - TSH in normal range and within 360 days    TSH  Date Value Ref Range Status  08/03/2021 1.340 0.450 - 4.500 uIU/mL Final         Passed -  Valid encounter within last 12 months    Recent Outpatient Visits          4 months ago Essential hypertension   [place], MD   6 months ago Need for shingles vaccine   [place], Jarome Matin, RPH-CPP   8 months ago Essential hypertension   [place], Jarome Matin, RPH-CPP   8 months ago Essential hypertension   [place], Broadus John, FNP   8 months ago Essential hypertension   [place] Karle Plumber B, MD             . atorvastatin (LIPITOR) 20 MG tablet 90 tablet 2    Sig: Take 1 tablet (20 mg total) by mouth daily.     Cardiovascular:  Antilipid - Statins Failed - 04/26/2022 11:02 AM      Failed - Lipid Panel in normal range within the last 12 months    Cholesterol, Total  Date Value Ref Range Status  08/03/2021 117 100 - 199 mg/dL Final   LDL Chol Calc (NIH)  Date Value Ref Range Status  08/03/2021 46 0 - 99 mg/dL Final   HDL  Date Value Ref Range Status  08/03/2021 50 >39 mg/dL Final   Triglycerides   Date Value Ref Range Status  08/03/2021 117 0 - 149 mg/dL Final         Passed - Patient is not pregnant      Passed - Valid encounter within last 12 months    Recent Outpatient Visits          4 months ago Essential hypertension   [place], MD   6 months ago Need for shingles vaccine   [place], Jarome Matin, RPH-CPP   8 months ago Essential hypertension   [place], Jarome Matin, RPH-CPP   8 months ago Essential hypertension   [place], [place], FNP   8 months ago Essential hypertension   [place], Neoma Laming B, MD             . amLODipine (NORVASC) 10 MG tablet 90 tablet 0    Sig: Take 1 tablet (10 mg total) by mouth daily.     Cardiovascular: Calcium Channel Blockers 2 Failed - 04/26/2022 11:02 AM      Failed - Last BP in normal range    BP Readings from Last 1 Encounters:  12/03/21 (!) 87/56         Passed - Last Heart Rate in normal range    Pulse Readings from Last 1 Encounters:  12/03/21 65         Passed - Valid encounter within last 6 months    Recent Outpatient Visits          4 months ago Essential hypertension   [place], MD   6 months ago Need for shingles vaccine   [place], Jarome Matin, RPH-CPP   8 months ago Essential hypertension   [place], Jarome Matin, RPH-CPP   8 months ago Essential hypertension   [place],  FNP   8 months ago Essential hypertension   [place], MD

## 2022-04-30 ENCOUNTER — Telehealth (HOSPITAL_COMMUNITY): Payer: Self-pay | Admitting: *Deleted

## 2022-04-30 ENCOUNTER — Other Ambulatory Visit (HOSPITAL_COMMUNITY): Payer: Self-pay | Admitting: Psychiatry

## 2022-04-30 DIAGNOSIS — F3130 Bipolar disorder, current episode depressed, mild or moderate severity, unspecified: Secondary | ICD-10-CM

## 2022-04-30 DIAGNOSIS — F411 Generalized anxiety disorder: Secondary | ICD-10-CM

## 2022-04-30 MED ORDER — MIRTAZAPINE 15 MG PO TABS: 15.0000 mg | ORAL_TABLET | Freq: Every day | ORAL | 3 refills | Status: AC

## 2022-04-30 MED ORDER — ZIPRASIDONE HCL 60 MG PO CAPS: ORAL_CAPSULE | ORAL | 3 refills | Status: AC

## 2022-04-30 MED ORDER — DULOXETINE HCL 60 MG PO CPEP: 60.0000 mg | ORAL_CAPSULE | Freq: Two times a day (BID) | ORAL | 3 refills | Status: AC

## 2022-04-30 MED ORDER — BUSPIRONE HCL 30 MG PO TABS: 30.0000 mg | ORAL_TABLET | Freq: Two times a day (BID) | ORAL | 3 refills | Status: AC

## 2022-04-30 NOTE — Telephone Encounter (Signed)
Medication refilled and sent to preferred pharmacy

## 2022-04-30 NOTE — Telephone Encounter (Signed)
[facility] ---  Faxed  Refill Request for  90 day Supply--   ziprasidone (GEODON) 60 MG capsule  busPIRone (BUSPAR) 30 MG tablet  mirtazapine (REMERON) 15 MG tablet  DULoxetine (CYMBALTA) 60 MG capsule

## 2022-05-17 ENCOUNTER — Other Ambulatory Visit: Payer: Self-pay | Admitting: Internal Medicine

## 2022-05-17 DIAGNOSIS — I10 Essential (primary) hypertension: Secondary | ICD-10-CM

## 2022-05-17 MED ORDER — AMLODIPINE BESYLATE 10 MG PO TABS: 10.0000 mg | ORAL_TABLET | Freq: Every day | ORAL | 0 refills | Status: AC

## 2022-05-17 NOTE — Telephone Encounter (Signed)
Medication Refill - Medication: amLODipine (NORVASC) 10 MG tablet   Has the patient contacted their pharmacy? No.  Preferred Pharmacy (with phone number or street name): [facility]  [address], [place], [place] [address] [phone]  Has the patient been seen for an appointment in the last year OR does the patient have an upcoming appointment? Yes.     Pt states he is out of medication and [facility] not [facility]   Pt is currently out of town

## 2022-05-17 NOTE — Telephone Encounter (Signed)
Requested Prescriptions  Pending Prescriptions Disp Refills   amLODipine (NORVASC) 10 MG tablet 90 tablet 0    Sig: Take 1 tablet (10 mg total) by mouth daily.     Cardiovascular: Calcium Channel Blockers 2 Failed - 05/17/2022 11:34 AM      Failed - Last BP in normal range    BP Readings from Last 1 Encounters:  12/03/21 (!) 87/56         Passed - Last Heart Rate in normal range    Pulse Readings from Last 1 Encounters:  12/03/21 65         Passed - Valid encounter within last 6 months    Recent Outpatient Visits           5 months ago Essential hypertension   [facility] Marcine Matar, MD   7 months ago Need for shingles vaccine   [facility] Lois Huxley, Cornelius Moras, RPH-CPP   9 months ago Essential hypertension   [facility] Lois Huxley, Cornelius Moras, RPH-CPP   9 months ago Essential hypertension   [facility] Eleonore Chiquito, FNP   9 months ago Essential hypertension   [facility] Marcine Matar, MD

## 2022-05-26 ENCOUNTER — Telehealth (HOSPITAL_BASED_OUTPATIENT_CLINIC_OR_DEPARTMENT_OTHER): Payer: Self-pay

## 2022-05-26 NOTE — Telephone Encounter (Signed)
Atc lvmtcb to schedule appt with alva 

## 2022-06-08 ENCOUNTER — Telehealth: Payer: Self-pay | Admitting: Emergency Medicine

## 2022-06-08 NOTE — Telephone Encounter (Signed)
Copied from CRM [id number]. Topic: General - Other >> Jun 08, 2022  3:08 PM Everette C wrote: Reason for CRM: The patient has called to request a letter from their PCP stating that they need life sustaining medications on an ongoing basis   The patient shares that the letter is needed for them to obtain health insurance  Please contact the patient further when possible

## 2022-06-09 NOTE — Telephone Encounter (Signed)
Called & spoke to patient. Verified name & DOB. Telehealth appointment has been scheduled for 06/11/2022 at 8:10 a.m.

## 2022-06-11 ENCOUNTER — Ambulatory Visit: Payer: Commercial Managed Care - HMO | Attending: Internal Medicine | Admitting: Internal Medicine

## 2022-06-11 DIAGNOSIS — Z029 Encounter for administrative examinations, unspecified: Secondary | ICD-10-CM | POA: Diagnosis not present

## 2022-06-11 DIAGNOSIS — Z23 Encounter for immunization: Secondary | ICD-10-CM

## 2022-06-11 NOTE — Progress Notes (Signed)
Patient ID: Samuel Ewing, male   DOB: 1970-09-30, 51 y.o.   MRN: [id number] Virtual Visit via Telephone Note  I connected with Angad Nabers Silvio on 06/11/2022 at 8:18 AM by telephone and verified that I am speaking with the correct person using two identifiers  Location: Patient: home Provider: office  Participants: Myself Patient   I discussed the limitations, risks, security and privacy concerns of performing an evaluation and management service by telephone and the availability of in person appointments. I also discussed with the patient that there may be a patient responsible charge related to this service. The patient expressed understanding and agreed to proceed.   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  He is trying to get new insurance.  He is agreeable to me listing his current medical issues.  He would like for me to mail it to him.  He states that his address needs to be updated. He has not had flu shot as but plans to get it. Still showing on HM that he is due for colon cancer screening.  However patient reminded me that he did have a colonoscopy 2 to 3 years ago in [place].  No polyps were removed.  We had him sign a release several months ago on one of his office visits for [place] to get the colonoscopy report but it has not been received as yet.   Outpatient Encounter Medications as of 06/11/2022  Medication Sig   amLODipine (NORVASC) 10 MG tablet Take 1 tablet (10 mg total) by mouth daily.   atorvastatin (LIPITOR) 20 MG tablet Take 1 tablet (20 mg total) by mouth daily.   buprenorphine-naloxone (SUBOXONE) 2-0.5 mg SUBL SL tablet Place 1 tablet under the tongue daily.    busPIRone (BUSPAR) 30 MG tablet Take 1 tablet (30 mg total) by mouth 2 (two) times daily.   diazepam (VALIUM) 2 MG tablet Take 1 tablet (2 mg total) by mouth daily as needed for anxiety.   DULoxetine (CYMBALTA) 60 MG capsule Take 1 capsule (60 mg total) by mouth 2 (two) times daily.   influenza vac split quadrivalent PF (FLUARIX) 0.5 ML injection Inject into the muscle.   levothyroxine (SYNTHROID) 50 MCG tablet Take 1 tablet (50 mcg total) by mouth daily.   lisinopril (ZESTRIL) 20 MG tablet Take 1 tablet (20 mg total) by mouth daily.   mirtazapine (REMERON) 15 MG tablet Take 1 tablet (15 mg total) by mouth at bedtime.   propranolol (INDERAL) 20 MG tablet Take 1 tablet (20 mg total) by mouth 3 (three) times daily.   SUMAtriptan (IMITREX) 100 MG tablet Take 100 mg by mouth daily as needed for migraine. May repeat in 2 hours if headache persists or recurs.   ziprasidone (GEODON) 60 MG capsule TAKE 1 CAPSULE BY MOUTH TWICE DAILY WITH A MEAL   No facility-administered encounter medications on file as of 06/11/2022.      Observations/Objective: No direct observation done as this was a telephone visit.  Assessment and Plan: 1. Administrative encounter Letter will be written for him.  Patient states he would like the letter mailed to him.  He states that his address has changed.  He will call the front desk this morning to update his mailing  information.  I told him I will hold off on having my CMA send him the letter until Monday.  2. Need for influenza vaccination Patient states he will get this at his pharmacy.   Follow Up Instructions: Keep his routine follow-up visit.   I discussed the assessment and treatment plan with the patient. The patient was provided an opportunity to ask questions and all were answered. The patient agreed with the plan and demonstrated an understanding of the instructions.   The patient was advised to call back or seek an in-person evaluation if the symptoms  worsen or if the condition fails to improve as anticipated.  I  Spent 7 minutes on this telephone encounter  This note has been created with Education officer, environmental. Any transcriptional errors are unintentional.  Jonah Blue, MD

## 2022-06-14 ENCOUNTER — Telehealth: Payer: Self-pay

## 2022-06-14 NOTE — Telephone Encounter (Signed)
Called & LVM for patient to call back. Please let patient know that letter is ready for pick-up at the front desk.

## 2022-06-16 NOTE — Telephone Encounter (Signed)
Called & spoke to patient. Verified name & DOB. Verified address on file. Patient stated it is correct. Letter sent via mail.

## 2022-06-16 NOTE — Telephone Encounter (Signed)
Pt called and requests that this gets mailed for him because he is out of town.   [address] [place] [address]

## 2022-06-30 ENCOUNTER — Telehealth (HOSPITAL_COMMUNITY): Payer: Commercial Managed Care - HMO | Admitting: Student in an Organized Health Care Education/Training Program

## 2022-06-30 ENCOUNTER — Encounter: Payer: Self-pay | Admitting: Internal Medicine

## 2022-07-09 ENCOUNTER — Telehealth (HOSPITAL_COMMUNITY): Payer: Self-pay | Admitting: *Deleted

## 2022-07-09 NOTE — Telephone Encounter (Signed)
PATIENT CALLED STATED THAT HE HAS MOVED OUT OF STATE & IS RUNNING OUT OF MEDICATION  UNABLE TO SEE NEW PROVIDER UNTIL NEXT MONTH (FEB).  CALLED PATIENT BACK TO SUGGEST THAT HE MAY GO THE E.R. TO BE SEEN & THEY WILL GIVE MEDICATION. HE HAS TO MAKE IT KNOWN OF HIS UPCOMING APPT TO GET ENOUGH MEDICATION TO [place] OVER UNTIL THEN
